# Patient Record
Sex: Female | Born: 1980
Health system: Southern US, Community
[De-identification: ages and names within clinical notes are randomized; demographics above are authoritative.]

## PROBLEM LIST (undated history)

## (undated) DIAGNOSIS — R202 Paresthesia of skin: Secondary | ICD-10-CM

## (undated) DIAGNOSIS — D649 Anemia, unspecified: Secondary | ICD-10-CM

## (undated) DIAGNOSIS — E669 Obesity, unspecified: Secondary | ICD-10-CM

## (undated) DIAGNOSIS — K589 Irritable bowel syndrome without diarrhea: Secondary | ICD-10-CM

## (undated) DIAGNOSIS — E119 Type 2 diabetes mellitus without complications: Secondary | ICD-10-CM

## (undated) DIAGNOSIS — C541 Malignant neoplasm of endometrium: Secondary | ICD-10-CM

## (undated) DIAGNOSIS — R351 Nocturia: Secondary | ICD-10-CM

## (undated) HISTORY — DX: Irritable bowel syndrome, unspecified: K58.9

## (undated) HISTORY — DX: Malignant neoplasm of endometrium: C54.1

## (undated) HISTORY — PX: TYMPANOSTOMY TUBE PLACEMENT: SHX32

---

## 2003-11-09 ENCOUNTER — Emergency Department (HOSPITAL_COMMUNITY): Admission: EM | Admit: 2003-11-09 | Discharge: 2003-11-09 | Payer: Self-pay | Admitting: Emergency Medicine

## 2004-01-14 ENCOUNTER — Emergency Department (HOSPITAL_COMMUNITY): Admission: EM | Admit: 2004-01-14 | Discharge: 2004-01-14 | Payer: Self-pay | Admitting: *Deleted

## 2005-05-29 ENCOUNTER — Emergency Department (HOSPITAL_COMMUNITY): Admission: EM | Admit: 2005-05-29 | Discharge: 2005-05-29 | Payer: Self-pay | Admitting: Emergency Medicine

## 2005-06-03 ENCOUNTER — Emergency Department (HOSPITAL_COMMUNITY): Admission: EM | Admit: 2005-06-03 | Discharge: 2005-06-03 | Payer: Self-pay | Admitting: Emergency Medicine

## 2005-12-17 ENCOUNTER — Emergency Department (HOSPITAL_COMMUNITY): Admission: EM | Admit: 2005-12-17 | Discharge: 2005-12-17 | Payer: Self-pay | Admitting: Emergency Medicine

## 2005-12-21 ENCOUNTER — Emergency Department (HOSPITAL_COMMUNITY): Admission: EM | Admit: 2005-12-21 | Discharge: 2005-12-21 | Payer: Self-pay | Admitting: Emergency Medicine

## 2006-01-13 ENCOUNTER — Emergency Department (HOSPITAL_COMMUNITY): Admission: EM | Admit: 2006-01-13 | Discharge: 2006-01-13 | Payer: Self-pay | Admitting: Emergency Medicine

## 2009-02-15 ENCOUNTER — Emergency Department (HOSPITAL_COMMUNITY): Admission: EM | Admit: 2009-02-15 | Discharge: 2009-02-15 | Payer: Self-pay | Admitting: Emergency Medicine

## 2009-03-23 ENCOUNTER — Emergency Department (HOSPITAL_COMMUNITY): Admission: EM | Admit: 2009-03-23 | Discharge: 2009-03-24 | Payer: Self-pay | Admitting: Emergency Medicine

## 2010-08-27 ENCOUNTER — Ambulatory Visit
Admission: RE | Admit: 2010-08-27 | Discharge: 2010-08-27 | Disposition: A | Discharge status: Home / Self Care | Payer: 59 | Source: Ambulatory Visit | Attending: Family Medicine | Admitting: Family Medicine

## 2010-08-27 ENCOUNTER — Other Ambulatory Visit: Payer: Self-pay | Admitting: Family Medicine

## 2010-08-27 DIAGNOSIS — I2699 Other pulmonary embolism without acute cor pulmonale: Secondary | ICD-10-CM

## 2010-08-27 DIAGNOSIS — R0789 Other chest pain: Secondary | ICD-10-CM

## 2010-08-27 MED ORDER — IOHEXOL 350 MG/ML SOLN
125.0000 mL | Freq: Once | INTRAVENOUS | Status: AC | PRN
  Administered 2010-08-27: 125 mL via INTRAVENOUS

## 2012-02-09 ENCOUNTER — Emergency Department (HOSPITAL_COMMUNITY): Payer: 59

## 2012-02-09 ENCOUNTER — Emergency Department (HOSPITAL_COMMUNITY)
Admission: EM | Admit: 2012-02-09 | Discharge: 2012-02-09 | Disposition: A | Discharge status: Home / Self Care | Payer: 59 | Attending: Emergency Medicine | Admitting: Emergency Medicine

## 2012-02-09 ENCOUNTER — Encounter (HOSPITAL_COMMUNITY): Payer: Self-pay | Admitting: Emergency Medicine

## 2012-02-09 DIAGNOSIS — K529 Noninfective gastroenteritis and colitis, unspecified: Secondary | ICD-10-CM

## 2012-02-09 DIAGNOSIS — K5289 Other specified noninfective gastroenteritis and colitis: Secondary | ICD-10-CM | POA: Insufficient documentation

## 2012-02-09 DIAGNOSIS — K449 Diaphragmatic hernia without obstruction or gangrene: Secondary | ICD-10-CM | POA: Insufficient documentation

## 2012-02-09 DIAGNOSIS — Z3202 Encounter for pregnancy test, result negative: Secondary | ICD-10-CM | POA: Insufficient documentation

## 2012-02-09 LAB — BASIC METABOLIC PANEL
BUN: 13 mg/dL (ref 6–23)
Creatinine, Ser: 0.69 mg/dL (ref 0.50–1.10)
GFR calc Af Amer: >90 mL/min (ref >90)
GFR calc non Af Amer: >90 mL/min (ref >90)
Glucose, Bld: 94 mg/dL (ref 70–99)
Potassium: 4.5 mEq/L (ref 3.5–5.1)

## 2012-02-09 LAB — CBC WITH DIFFERENTIAL/PLATELET
Basophils Relative: 0 % (ref 0–1)
Eosinophils Absolute: 0 10*3/uL (ref 0.0–0.7)
Eosinophils Relative: 1 % (ref 0–5)
HCT: 37.3 % (ref 36.0–46.0)
Hemoglobin: 12.6 g/dL (ref 12.0–15.0)
Lymphs Abs: 2.1 10*3/uL (ref 0.7–4.0)
MCH: 27 pg (ref 26.0–34.0)
MCHC: 33.8 g/dL (ref 30.0–36.0)
MCV: 80 fL (ref 78.0–100.0)
Monocytes Absolute: 0.3 10*3/uL (ref 0.1–1.0)
Monocytes Relative: 5 % (ref 3–12)
Neutrophils Relative %: 57 % (ref 43–77)
RBC: 4.66 MIL/uL (ref 3.87–5.11)

## 2012-02-09 LAB — URINALYSIS, ROUTINE W REFLEX MICROSCOPIC
Bilirubin Urine: NEGATIVE
Glucose, UA: NEGATIVE mg/dL
Ketones, ur: NEGATIVE mg/dL
Nitrite: NEGATIVE
Protein, ur: NEGATIVE mg/dL
pH: 5 (ref 5.0–8.0)

## 2012-02-09 LAB — URINE MICROSCOPIC-ADD ON

## 2012-02-09 NOTE — ED Notes (Signed)
Pt denies nausea/vomiting/diarrhea. Pt states last year she went to gastroenterologist and was told her intestines were having spasms.

## 2012-02-09 NOTE — ED Notes (Signed)
CT called and notified results of pregnancy test have resulted and pt ready for transport to CT.

## 2012-02-09 NOTE — ED Notes (Signed)
Pt states when she went to the bathroom earlier she passed a watery stool like diarrhea.

## 2012-02-09 NOTE — ED Provider Notes (Signed)
History     CSN: [id number]  Arrival date & time 02/09/12  1036   First MD Initiated Contact with Patient 02/09/12 1143      Chief Complaint  Patient presents with  . Abdominal Pain     HPI Patient presents with periumbilical abdominal pain which comes and goes.  Patient states the pain is intense initially and then goes away.  Pain was 0/10 when I examined her.  Has had some loose stools and diarrhea.  No fever chills.  No previous abdominal surgeries. History reviewed. No pertinent past medical history.  History reviewed. No pertinent past surgical history.  History reviewed. No pertinent family history.  History  Substance Use Topics  . Smoking status: Not on file  . Smokeless tobacco: Not on file  . Alcohol Use: Not on file    OB History    Grav Para Term Preterm Abortions TAB SAB Ect Mult Living                  Review of Systems All other systems reviewed and are negative Allergies  Latex  Home Medications  No current outpatient prescriptions on file.  BP 137/72  Pulse 80  Temp 97.1 F (36.2 C) (Oral)  Resp 16  SpO2 100%  LMP 06/09/2011  Physical Exam  Nursing note and vitals reviewed. Constitutional: She is oriented to person, place, and time. She appears well-developed and well-nourished. No distress.       Morbid obesity  HENT:  Head: Normocephalic and atraumatic.  Eyes: Pupils are equal, round, and reactive to light.  Neck: Normal range of motion.  Cardiovascular: Normal rate and intact distal pulses.   Pulmonary/Chest: No respiratory distress.  Abdominal: Normal appearance. She exhibits no distension. There is no tenderness. There is no rebound.  Musculoskeletal: Normal range of motion.  Neurological: She is alert and oriented to person, place, and time. No cranial nerve deficit.  Skin: Skin is warm and dry. No rash noted.  Psychiatric: She has a normal mood and affect. Her behavior is normal.    ED Course  Procedures (including critical  care time)  Labs Reviewed  URINALYSIS, ROUTINE W REFLEX MICROSCOPIC - Abnormal; Notable for the following:    APPearance HAZY (*)     Hgb urine dipstick MODERATE (*)     Leukocytes, UA SMALL (*)     All other components within normal limits  URINE MICROSCOPIC-ADD ON - Abnormal; Notable for the following:    Squamous Epithelial / LPF FEW (*)     Bacteria, UA MANY (*)     All other components within normal limits  CBC WITH DIFFERENTIAL  BASIC METABOLIC PANEL  LIPASE, BLOOD  PREGNANCY, URINE  URINE CULTURE   Ct Abdomen Pelvis Wo Contrast  02/09/2012  *RADIOLOGY REPORT*  Clinical Data: Central abdominal pain.  Hematuria.  History of interval bowel syndrome.  CT ABDOMEN AND PELVIS WITHOUT CONTRAST  Technique:  Multidetector CT imaging of the abdomen and pelvis was performed following the standard protocol without intravenous contrast.  Comparison: Chest CT 08/27/2010.  No comparison abdominal/pelvic CT.  Findings: Lung bases clear.  No evidence of renal or ureteral obstructing stone or findings to suggest renal collecting system obstruction.  No gross abnormality detected involving the bladder.  No extraluminal bowel inflammatory process, free fluid or free air. Specifically, no inflammation surrounds the appendix.  Small hiatal hernia.  Fatty infiltration of liver.  Evaluation of solid abdominal viscera is limited by lack of IV contrast.  Taking  this limitation into account no worrisome focal hepatic, splenic, pancreatic, renal or adrenal lesion.  Dense bile without evidence of calcified gallstone.  No bony destructive lesion.  Mild degenerative changes lumbar spine.  No obvious uterine or adnexa abnormality.  Scattered normal size mesenteric and retroperitoneal lymph nodes.  IMPRESSION: No evidence of renal or ureteral obstructing stone or findings to suggest renal collecting system obstruction.  No extraluminal bowel inflammatory process, free fluid or free air. Specifically, no inflammation  surrounds the appendix.  Small hiatal hernia.  Fatty infiltration of liver.   Original Report Authenticated By: Lacy Duverney, M.D.      1. Gastroenteritis   2. Hiatal hernia       MDM  Patient is requesting food and drink.       Nelia Shi, MD 02/09/12 [phone]

## 2012-02-09 NOTE — ED Notes (Signed)
Per EMS: pt c/o umbilical abd pain starting this am; pt sts pain is severe; pt sts ate at [employer] yesterday; unknown LMP

## 2012-02-09 NOTE — ED Notes (Signed)
Pts ticket to ride placed on pts bed. Pt denies nausea. Pt denies pain currently. Pt mentating appropriately. Pt states she just "feels a little weak."

## 2012-02-10 LAB — URINE CULTURE

## 2014-04-24 ENCOUNTER — Emergency Department (HOSPITAL_COMMUNITY): Payer: Worker's Compensation

## 2014-04-24 ENCOUNTER — Emergency Department (HOSPITAL_COMMUNITY)
Admission: EM | Admit: 2014-04-24 | Discharge: 2014-04-24 | Disposition: A | Discharge status: Home / Self Care | Payer: Worker's Compensation | Attending: Emergency Medicine | Admitting: Emergency Medicine

## 2014-04-24 ENCOUNTER — Encounter (HOSPITAL_COMMUNITY): Payer: Self-pay | Admitting: Emergency Medicine

## 2014-04-24 DIAGNOSIS — W19XXXA Unspecified fall, initial encounter: Secondary | ICD-10-CM

## 2014-04-24 DIAGNOSIS — Z91040 Latex allergy status: Secondary | ICD-10-CM | POA: Insufficient documentation

## 2014-04-24 DIAGNOSIS — W010XXA Fall on same level from slipping, tripping and stumbling without subsequent striking against object, initial encounter: Secondary | ICD-10-CM | POA: Insufficient documentation

## 2014-04-24 DIAGNOSIS — E119 Type 2 diabetes mellitus without complications: Secondary | ICD-10-CM | POA: Diagnosis not present

## 2014-04-24 DIAGNOSIS — Y9389 Activity, other specified: Secondary | ICD-10-CM | POA: Diagnosis not present

## 2014-04-24 DIAGNOSIS — M1711 Unilateral primary osteoarthritis, right knee: Secondary | ICD-10-CM | POA: Insufficient documentation

## 2014-04-24 DIAGNOSIS — Y9289 Other specified places as the place of occurrence of the external cause: Secondary | ICD-10-CM | POA: Diagnosis not present

## 2014-04-24 DIAGNOSIS — Y998 Other external cause status: Secondary | ICD-10-CM | POA: Diagnosis not present

## 2014-04-24 DIAGNOSIS — Z862 Personal history of diseases of the blood and blood-forming organs and certain disorders involving the immune mechanism: Secondary | ICD-10-CM | POA: Insufficient documentation

## 2014-04-24 DIAGNOSIS — S8991XA Unspecified injury of right lower leg, initial encounter: Secondary | ICD-10-CM | POA: Insufficient documentation

## 2014-04-24 HISTORY — DX: Type 2 diabetes mellitus without complications: E11.9

## 2014-04-24 HISTORY — DX: Anemia, unspecified: D64.9

## 2014-04-24 MED ORDER — IBUPROFEN 400 MG PO TABS
600.0000 mg | ORAL_TABLET | Freq: Once | ORAL | Status: AC
  Administered 2014-04-24: 600 mg via ORAL
  Filled 2014-04-24: qty 3

## 2014-04-24 NOTE — ED Provider Notes (Signed)
CSN: [id number]     Arrival date & time 04/24/14  8101 History   This chart was scribed for non-physician practitioner, Al Corpus, PA-C, working with Evelina Bucy, MD by Ladene Artist, ED Scribe. This patient was seen in room TR11C/TR11C and the patient's care was started at .   Chief Complaint  Patient presents with  . Knee Pain   The history is provided by the patient. No language interpreter was used.   HPI Comments: Krista White is a 34 y.o. female, with a h/o DM, anemia, who presents to the Emergency Department complaining of a fall that occurred this morning. Pt was at work when she slipped on a rug and fell. She states that she fell into a split and her R leg twisted under her upon landing. She reports secondary R knee pain and R leg pain that is exacerbated with bearing weight. Pt denies associated joint swelling. No medications tried PTA.   Past Medical History  Diagnosis Date  . Anemia   . Diabetes mellitus without complication    History reviewed. No pertinent past surgical history. No family history on file. History  Substance Use Topics  . Smoking status: Never Smoker   . Smokeless tobacco: Not on file  . Alcohol Use: No   OB History    No data available     Review of Systems  Musculoskeletal: Positive for myalgias and arthralgias. Negative for joint swelling.  Skin: Negative for color change and wound.  Neurological: Negative for weakness and numbness.   Allergies  Latex  Home Medications   Prior to Admission medications   Not on File   BP 136/77 mmHg  Pulse 81  Temp(Src) 97.7 F (36.5 C) (Oral)  Resp 18  Ht 5\' 4"  (1.626 m)  Wt 399 lb (180.985 kg)  BMI 68.45 kg/m2  SpO2 98%  LMP 04/17/2014 Physical Exam  Constitutional: She appears well-developed and well-nourished. No distress.  HENT:  Head: Normocephalic and atraumatic.  Eyes: Conjunctivae are normal. Right eye exhibits no discharge. Left eye exhibits no discharge.  Pulmonary/Chest:  Effort normal. No respiratory distress.  Musculoskeletal:  Left knee: No effusion, erythema or warmth. No tenderness over medial or lateral joint line. Tenderness to medial posterior knee without bursa or cyst. No ligamentous laxity noted. Negative anterior and posterior drawer test. Patient with normal gait. No tenderness to lower leg or thigh. No lesions, ecchymoses or wounds to thigh or leg. Patient ambulatory.   Neurological: She is alert. Coordination normal.  Skin: She is not diaphoretic.  Psychiatric: She has a normal mood and affect. Her behavior is normal.  Nursing note and vitals reviewed.  ED Course  Procedures (including critical care time) DIAGNOSTIC STUDIES: Oxygen Saturation is 98% on RA, normal by my interpretation.    COORDINATION OF CARE: 10:32 AM-Discussed treatment plan which includes XR, ace bandage, ibuprofen and RICE with pt at bedside and pt agreed to plan.   Labs Review Labs Reviewed - No data to display  Imaging Review Dg Knee Complete 4 Views Right  04/24/2014   CLINICAL DATA:  Slipped at work today, now with anterior knee pain.  EXAM: RIGHT KNEE - COMPLETE 4+ VIEW  COMPARISON:  None.  FINDINGS: No fracture or dislocation. Mild to moderate tricompartmental degenerative change of the knee, worse within the medial compartments and patellofemoral joints with joint space loss, articular surface irregularity, subchondral sclerosis and osteophytosis. No joint effusion. There is minimal spurring of the tibial spines. No evidence of chondrocalcinosis. Regional  soft tissues appear normal.  IMPRESSION: 1. No acute findings. 2. Mild-to-moderate tricompartmental degenerative change of the knee, worse within the medial compartments and patellofemoral joints.   Electronically Signed   By: Sandi Mariscal M.D.   On: 04/24/2014 09:58    EKG Interpretation None      MDM   Final diagnoses:  Fall, initial encounter  Right knee injury, initial encounter  Primary osteoarthritis of  right knee   Patient presents with fall to right knee with tenderness posterior aspect. Neurovascularly intact. Patient ambulatory. X-ray with evidence of osteoarthritis burst and patellofemoral joint but no acute abnormality. Rice protocol and ibuprofen discussed with patient. She is to follow-up with her primary for persistent symptoms.  Discussed return precautions with patient. Discussed all results and patient verbalizes understanding and agrees with plan.  I personally performed the services described in this documentation, which was scribed in my presence. The recorded information has been reviewed and is accurate.    Al Corpus, PA-C 04/24/14 Elaine, MD 04/24/14 [phone]

## 2014-04-24 NOTE — Discharge Instructions (Signed)
Return to the emergency room with worsening of symptoms, new symptoms or with symptoms that are concerning, especially fevers, redness, swelling, numbness, dealing, weakness. RICE: Rest, Ice (three cycles of 20 mins on, 50mins off at least twice a day), compression/brace, elevation. Heating pad works well for back pain. Ibuprofen 400mg  (2 tablets 200mg ) every 5-6 hours for 3-5 days. Follow up with PCP if symptoms worsen or are persistent. Read below information and follow recommendations. Knee Pain The knee is the complex joint between your thigh and your lower leg. It is made up of bones, tendons, ligaments, and cartilage. The bones that make up the knee are:  The femur in the thigh.  The tibia and fibula in the lower leg.  The patella or kneecap riding in the groove on the lower femur. CAUSES  Knee pain is a common complaint with many causes. A few of these causes are:  Injury, such as:  A ruptured ligament or tendon injury.  Torn cartilage.  Medical conditions, such as:  Gout  Arthritis  Infections  Overuse, over training, or overdoing a physical activity. Knee pain can be minor or severe. Knee pain can accompany debilitating injury. Minor knee problems often respond well to self-care measures or get well on their own. More serious injuries may need medical intervention or even surgery. SYMPTOMS The knee is complex. Symptoms of knee problems can vary widely. Some of the problems are:  Pain with movement and weight bearing.  Swelling and tenderness.  Buckling of the knee.  Inability to straighten or extend your knee.  Your knee locks and you cannot straighten it.  Warmth and redness with pain and fever.  Deformity or dislocation of the kneecap. DIAGNOSIS  Determining what is wrong may be very straight forward such as when there is an injury. It can also be challenging because of the complexity of the knee. Tests to make a diagnosis may include:  Your caregiver  taking a history and doing a physical exam.  Routine X-rays can be used to rule out other problems. X-rays will not reveal a cartilage tear. Some injuries of the knee can be diagnosed by:  Arthroscopy a surgical technique by which a small video camera is inserted through tiny incisions on the sides of the knee. This procedure is used to examine and repair internal knee joint problems. Tiny instruments can be used during arthroscopy to repair the torn knee cartilage (meniscus).  Arthrography is a radiology technique. A contrast liquid is directly injected into the knee joint. Internal structures of the knee joint then become visible on X-ray film.  An MRI scan is a non X-ray radiology procedure in which magnetic fields and a computer produce two- or three-dimensional images of the inside of the knee. Cartilage tears are often visible using an MRI scanner. MRI scans have largely replaced arthrography in diagnosing cartilage tears of the knee.  Blood work.  Examination of the fluid that helps to lubricate the knee joint (synovial fluid). This is done by taking a sample out using a needle and a syringe. TREATMENT The treatment of knee problems depends on the cause. Some of these treatments are:  Depending on the injury, proper casting, splinting, surgery, or physical therapy care will be needed.  Give yourself adequate recovery time. Do not overuse your joints. If you begin to get sore during workout routines, back off. Slow down or do fewer repetitions.  For repetitive activities such as cycling or running, maintain your strength and nutrition.  Alternate muscle  groups. For example, if you are a weight lifter, work the upper body on one day and the lower body the next.  Either tight or weak muscles do not give the proper support for your knee. Tight or weak muscles do not absorb the stress placed on the knee joint. Keep the muscles surrounding the knee strong.  Take care of mechanical  problems.  If you have flat feet, orthotics or special shoes may help. See your caregiver if you need help.  Arch supports, sometimes with wedges on the inner or outer aspect of the heel, can help. These can shift pressure away from the side of the knee most bothered by osteoarthritis.  A brace called an "unloader" brace also may be used to help ease the pressure on the most arthritic side of the knee.  If your caregiver has prescribed crutches, braces, wraps or ice, use as directed. The acronym for this is PRICE. This means protection, rest, ice, compression, and elevation.  Nonsteroidal anti-inflammatory drugs (NSAIDs), can help relieve pain. But if taken immediately after an injury, they may actually increase swelling. Take NSAIDs with food in your stomach. Stop them if you develop stomach problems. Do not take these if you have a history of ulcers, stomach pain, or bleeding from the bowel. Do not take without your caregiver's approval if you have problems with fluid retention, heart failure, or kidney problems.  For ongoing knee problems, physical therapy may be helpful.  Glucosamine and chondroitin are over-the-counter dietary supplements. Both may help relieve the pain of osteoarthritis in the knee. These medicines are different from the usual anti-inflammatory drugs. Glucosamine may decrease the rate of cartilage destruction.  Injections of a corticosteroid drug into your knee joint may help reduce the symptoms of an arthritis flare-up. They may provide pain relief that lasts a few months. You may have to wait a few months between injections. The injections do have a small increased risk of infection, water retention, and elevated blood sugar levels.  Hyaluronic acid injected into damaged joints may ease pain and provide lubrication. These injections may work by reducing inflammation. A series of shots may give relief for as long as 6 months.  Topical painkillers. Applying certain  ointments to your skin may help relieve the pain and stiffness of osteoarthritis. Ask your pharmacist for suggestions. Many over the-counter products are approved for temporary relief of arthritis pain.  In some countries, doctors often prescribe topical NSAIDs for relief of chronic conditions such as arthritis and tendinitis. A review of treatment with NSAID creams found that they worked as well as oral medications but without the serious side effects. PREVENTION  Maintain a healthy weight. Extra pounds put more strain on your joints.  Get strong, stay limber. Weak muscles are a common cause of knee injuries. Stretching is important. Include flexibility exercises in your workouts.  Be smart about exercise. If you have osteoarthritis, chronic knee pain or recurring injuries, you may need to change the way you exercise. This does not mean you have to stop being active. If your knees ache after jogging or playing basketball, consider switching to swimming, water aerobics, or other low-impact activities, at least for a few days a week. Sometimes limiting high-impact activities will provide relief.  Make sure your shoes fit well. Choose footwear that is right for your sport.  Protect your knees. Use the proper gear for knee-sensitive activities. Use kneepads when playing volleyball or laying carpet. Buckle your seat belt every time you drive. Most  shattered kneecaps occur in car accidents.  Rest when you are tired. SEEK MEDICAL CARE IF:  You have knee pain that is continual and does not seem to be getting better.  SEEK IMMEDIATE MEDICAL CARE IF:  Your knee joint feels hot to the touch and you have a high fever. MAKE SURE YOU:   Understand these instructions.  Will watch your condition.  Will get help right away if you are not doing well or get worse. Document Released: 11/17/2006 Document Revised: 04/14/2011 Document Reviewed: 11/17/2006 [facility] Patient Information 2015 [place], [place]. This  information is not intended to replace advice given to you by your health care provider. Make sure you discuss any questions you have with your health care provider.

## 2014-04-24 NOTE — ED Notes (Signed)
Patient states tripped on rug at work and landed "with my leg twisted up under me".  Patient complains of R knee pain and leg pain when walking.

## 2014-05-27 ENCOUNTER — Ambulatory Visit (INDEPENDENT_AMBULATORY_CARE_PROVIDER_SITE_OTHER): Payer: 59 | Admitting: Internal Medicine

## 2014-05-27 VITALS — BP 123/78 | HR 99 | Temp 98.2°F | Resp 20 | Ht 64.0 in | Wt 396.5 lb

## 2014-05-27 DIAGNOSIS — J0100 Acute maxillary sinusitis, unspecified: Secondary | ICD-10-CM | POA: Diagnosis not present

## 2014-05-27 MED ORDER — AMOXICILLIN 500 MG PO CAPS: 1000.0000 mg | ORAL_CAPSULE | Freq: Two times a day (BID) | ORAL | Status: AC

## 2014-05-27 MED ORDER — MELOXICAM 15 MG PO TABS: 15.0000 mg | ORAL_TABLET | Freq: Every day | ORAL | Status: DC

## 2014-05-27 NOTE — Progress Notes (Signed)
   Subjective:  This chart was scribed for Krista Lin, MD by Mercy Moore, Medial Scribe. This patient was seen in room 10 and the patient's care was started at 12:28 PM.    Patient ID: Krista White, female    DOB: 1980/09/24, 34 y.o.   MRN: [id number] Chief Complaint  Patient presents with  . Sinus Pressure  . Ear Pain    LEft    HPI HPI Comments: Krista White is a 34 y.o. female who presents to the [facility] complaining of sinus infection that initiated as headache a few days ago. Patient reports later development radiation of pain into her left ear and left face and onset of cough last night. Patient congestion upon waking up that is relieved with blowing her nose. Patient reports increased sinus pressure with bending over.   There are no active problems to display for this patient.  she is a new patient to this practice coming for urgent care She has a PCP for her diabetes and morbid obesity  Past Medical History  Diagnosis Date  . Anemia   . Diabetes mellitus without complication    History reviewed. No pertinent past surgical history. Allergies  Allergen Reactions  . Latex Itching  . Pineapple     Mouth itches  . Lactase Diarrhea   Prior to Admission medications   Not on File   History   Social History  . Marital Status: Single    Spouse Name: N/A  . Number of Children: N/A  . Years of Education: N/A   Occupational History  . Not on file.   Social History Main Topics  . Smoking status: Never Smoker   . Smokeless tobacco: Never Used  . Alcohol Use: No  . Drug Use: No  . Sexual Activity: Not on file   Other Topics Concern  . Not on file   Social History Narrative      Review of Systems  Constitutional: Negative for fever.  HENT: Positive for congestion and sinus pressure.   Respiratory: Positive for cough.   Neurological: Positive for headaches.   no recent weight changes     Objective:   Physical Exam  HENT:   TMs clear. Nares with purulent mucus on left. Throat clear.   Neck:  No cervical nodes.  Pulmonary/Chest:  Lungs clear to ascultation.    heart regular without murmur Extremities clear No skin rash   Filed Vitals:   05/27/14 1211  BP: 123/78  Pulse: 99  Temp: 98.2 F (36.8 C)  TempSrc: Oral  Resp: 20  Height: 5\' 4"  (1.626 m)  Weight: 396 lb 8 oz (179.851 kg)  SpO2: 97%         Assessment & Plan:  Acute maxillary sinusitis, recurrence not specified   Meds ordered this encounter  Medications  . amoxicillin (AMOXIL) 500 MG capsule    Sig: Take 2 capsules (1,000 mg total) by mouth 2 (two) times daily.    Dispense:  40 capsule    Refill:  0  . meloxicam (MOBIC) 15 MG tablet    Sig: Take 1 tablet (15 mg total) by mouth daily. For pain    Dispense:  10 tablet    Refill:  0   Follow-up with PCP regarding morbid obesity I have completed the patient encounter in its entirety as documented by the scribe, with editing by me where necessary. Deovion Batrez P. Laney Pastor, M.D.

## 2014-06-10 ENCOUNTER — Emergency Department (HOSPITAL_COMMUNITY)
Admission: EM | Admit: 2014-06-10 | Discharge: 2014-06-10 | Disposition: A | Discharge status: Home / Self Care | Payer: 59 | Attending: Emergency Medicine | Admitting: Emergency Medicine

## 2014-06-10 ENCOUNTER — Encounter (HOSPITAL_COMMUNITY): Payer: Self-pay | Admitting: Emergency Medicine

## 2014-06-10 DIAGNOSIS — Z91040 Latex allergy status: Secondary | ICD-10-CM | POA: Diagnosis not present

## 2014-06-10 DIAGNOSIS — E669 Obesity, unspecified: Secondary | ICD-10-CM | POA: Diagnosis not present

## 2014-06-10 DIAGNOSIS — Z791 Long term (current) use of non-steroidal anti-inflammatories (NSAID): Secondary | ICD-10-CM | POA: Diagnosis not present

## 2014-06-10 DIAGNOSIS — H9202 Otalgia, left ear: Secondary | ICD-10-CM | POA: Diagnosis not present

## 2014-06-10 DIAGNOSIS — Z862 Personal history of diseases of the blood and blood-forming organs and certain disorders involving the immune mechanism: Secondary | ICD-10-CM | POA: Diagnosis not present

## 2014-06-10 DIAGNOSIS — E119 Type 2 diabetes mellitus without complications: Secondary | ICD-10-CM | POA: Insufficient documentation

## 2014-06-10 DIAGNOSIS — R0981 Nasal congestion: Secondary | ICD-10-CM | POA: Diagnosis not present

## 2014-06-10 HISTORY — DX: Obesity, unspecified: E66.9

## 2014-06-10 NOTE — Discharge Instructions (Signed)
Please use Zyrtec for your symptoms. Your symptoms do not resolve in the next 5-7 days, please follow-up with ear nose and throat specialist that has been provided. There is no evidence of bacterial infection on your exam today, thus no antibiotics were indicated in your treatment.  Otalgia The most common reason for this in children is an infection of the middle ear. Pain from the middle ear is usually caused by a build-up of fluid and pressure behind the eardrum. Pain from an earache can be sharp, dull, or burning. The pain may be temporary or constant. The middle ear is connected to the nasal passages by a short narrow tube called the Eustachian tube. The Eustachian tube allows fluid to drain out of the middle ear, and helps keep the pressure in your ear equalized. CAUSES  A cold or allergy can block the Eustachian tube with inflammation and the build-up of secretions. This is especially likely in small children, because their Eustachian tube is shorter and more horizontal. When the Eustachian tube closes, the normal flow of fluid from the middle ear is stopped. Fluid can accumulate and cause stuffiness, pain, hearing loss, and an ear infection if germs start growing in this area. SYMPTOMS  The symptoms of an ear infection may include fever, ear pain, fussiness, increased crying, and irritability. Many children will have temporary and minor hearing loss during and right after an ear infection. Permanent hearing loss is rare, but the risk increases the more infections a child has. Other causes of ear pain include retained water in the outer ear canal from swimming and bathing. Ear pain in adults is less likely to be from an ear infection. Ear pain may be referred from other locations. Referred pain may be from the joint between your jaw and the skull. It may also come from a tooth problem or problems in the neck. Other causes of ear pain include:  A foreign body in the ear.  Outer ear  infection.  Sinus infections.  Impacted ear wax.  Ear injury.  Arthritis of the jaw or TMJ problems.  Middle ear infection.  Tooth infections.  Sore throat with pain to the ears. DIAGNOSIS  Your caregiver can usually make the diagnosis by examining you. Sometimes other special studies, including x-rays and lab work may be necessary. TREATMENT   If antibiotics were prescribed, use them as directed and finish them even if you or your child's symptoms seem to be improved.  Sometimes PE tubes are needed in children. These are little plastic tubes which are put into the eardrum during a simple surgical procedure. They allow fluid to drain easier and allow the pressure in the middle ear to equalize. This helps relieve the ear pain caused by pressure changes. HOME CARE INSTRUCTIONS   Only take over-the-counter or prescription medicines for pain, discomfort, or fever as directed by your caregiver. DO NOT GIVE CHILDREN ASPIRIN because of the association of Reye's Syndrome in children taking aspirin.  Use a cold pack applied to the outer ear for 15-20 minutes, 03-04 times per day or as needed may reduce pain. Do not apply ice directly to the skin. You may cause frost bite.  Over-the-counter ear drops used as directed may be effective. Your caregiver may sometimes prescribe ear drops.  Resting in an upright position may help reduce pressure in the middle ear and relieve pain.  Ear pain caused by rapidly descending from high altitudes can be relieved by swallowing or chewing gum. Allowing infants to suck  on a bottle during airplane travel can help.  Do not smoke in the house or near children. If you are unable to quit smoking, smoke outside.  Control allergies. SEEK IMMEDIATE MEDICAL CARE IF:   You or your child are becoming sicker.  Pain or fever relief is not obtained with medicine.  You or your child's symptoms (pain, fever, or irritability) do not improve within 24 to 48 hours or  as instructed.  Severe pain suddenly stops hurting. This may indicate a ruptured eardrum.  You or your children develop new problems such as severe headaches, stiff neck, difficulty swallowing, or swelling of the face or around the ear. Document Released: 09/07/2003 Document Revised: 04/14/2011 Document Reviewed: 01/12/2008 [facility] Patient Information 2015 [place], [place]. This information is not intended to replace advice given to you by your health care provider. Make sure you discuss any questions you have with your health care provider.

## 2014-06-10 NOTE — ED Provider Notes (Signed)
CSN: [id number]     Arrival date & time 06/10/14  1921 History  This chart was scribed for a non-physician practitioner, Montine Circle, PA-C working with Nat Christen, MD by [place] Peace, ED Scribe. The patient was seen in [facility]. The patient's care was started at 7:47 PM.     Chief Complaint  Patient presents with  . Otalgia      Patient is a 34 y.o. female presenting with ear pain. The history is provided by the patient. No language interpreter was used.  Otalgia Location:  Left Duration:  3 weeks Progression:  Unchanged Associated symptoms: congestion   Associated symptoms: no diarrhea and no vomiting   Risk factors: chronic ear infection   HPI Comments: Krista White is a 34 y.o. female who presents to the Emergency Department complaining of radiating left ear pain x 3 weeks. Pt reports that she was seen previously seen at the Urgent Care where she was given Amoxicillin and some pain medication for 10 days to address what she thought was a sinus infection. She explains that she did start the Amoxicillin she was prescribed but did not finish it all the way through because she didn't think it was working. History of Ear Infections and DM. Pt is non-smoker.    Past Medical History  Diagnosis Date  . Anemia   . Diabetes mellitus without complication   . Obesity    History reviewed. No pertinent past surgical history. Family History  Problem Relation Age of Onset  . Cancer Mother   . Diabetes Father    History  Substance Use Topics  . Smoking status: Never Smoker   . Smokeless tobacco: Never Used  . Alcohol Use: No   OB History    No data available     Review of Systems  HENT: Positive for congestion and ear pain.   Gastrointestinal: Negative for nausea, vomiting and diarrhea.  All other systems reviewed and are negative.     Allergies  Latex; Pineapple; and Lactase  Home Medications   Prior to Admission medications   Medication Sig Start Date End Date  Taking? Authorizing Provider  meloxicam (MOBIC) 15 MG tablet Take 1 tablet (15 mg total) by mouth daily. For pain 05/27/14   Leandrew Koyanagi, MD   BP 139/82 mmHg  Pulse 87  Temp(Src) 98.2 F (36.8 C) (Oral)  Resp 14  SpO2 97%  LMP 05/20/2014 Physical Exam  Constitutional: She is oriented to person, place, and time. She appears well-developed and well-nourished. No distress.  HENT:  Head: Normocephalic and atraumatic.  Bilateral tympanic membranes have some old scar tissue, but there is no evidence of fluid behind the tympanic membrane, no erythema, no evidence of infection, oropharynx is clear, no states, no abscess, no stridor  Eyes: Conjunctivae and EOM are normal.  Neck: Neck supple. No tracheal deviation present.  Cardiovascular: Normal rate, regular rhythm and normal heart sounds.   Pulmonary/Chest: Effort normal and breath sounds normal. No respiratory distress.  Musculoskeletal: Normal range of motion.  Neurological: She is alert and oriented to person, place, and time.  Skin: Skin is warm and dry.  Psychiatric: She has a normal mood and affect. Her behavior is normal.  Nursing note and vitals reviewed.   ED Course  Procedures (including critical care time) Labs Review Labs Reviewed - No data to display  Imaging Review No results found.   EKG Interpretation None     Medications - No data to display  7:51 PM- Treatment  plan was discussed with patient who verbalizes understanding and agrees.   MDM   Final diagnoses:  Otalgia of left ear    Patient with left ear otalgia 3 weeks. No fevers or chills. There is no evidence of infection on my exam. No mastoid tenderness. Patient has been treated with amoxicillin with no relief. Doubt bacterial process. Will treat with Zyrtec, and recommend follow-up with ENT. Patient understands and agrees the plan. She is stable and ready for discharge.  I personally performed the services described in this documentation, which  was scribed in my presence. The recorded information has been reviewed and is accurate.     Montine Circle, PA-C 06/10/14 2001  Nat Christen, MD 06/11/14 [phone]

## 2014-06-10 NOTE — ED Notes (Signed)
Pt. reports left ear ache onset 3 weeks ago , denies injury or hearing loss / no drainage or fever.

## 2014-06-16 ENCOUNTER — Encounter (HOSPITAL_COMMUNITY): Payer: Self-pay | Admitting: *Deleted

## 2014-06-16 ENCOUNTER — Emergency Department (HOSPITAL_COMMUNITY)
Admission: EM | Admit: 2014-06-16 | Discharge: 2014-06-16 | Disposition: A | Discharge status: Home / Self Care | Payer: 59 | Attending: Emergency Medicine | Admitting: Emergency Medicine

## 2014-06-16 DIAGNOSIS — Z79899 Other long term (current) drug therapy: Secondary | ICD-10-CM | POA: Diagnosis not present

## 2014-06-16 DIAGNOSIS — Z91040 Latex allergy status: Secondary | ICD-10-CM | POA: Diagnosis not present

## 2014-06-16 DIAGNOSIS — Z862 Personal history of diseases of the blood and blood-forming organs and certain disorders involving the immune mechanism: Secondary | ICD-10-CM | POA: Insufficient documentation

## 2014-06-16 DIAGNOSIS — E119 Type 2 diabetes mellitus without complications: Secondary | ICD-10-CM | POA: Insufficient documentation

## 2014-06-16 DIAGNOSIS — E669 Obesity, unspecified: Secondary | ICD-10-CM | POA: Diagnosis not present

## 2014-06-16 DIAGNOSIS — K029 Dental caries, unspecified: Secondary | ICD-10-CM | POA: Diagnosis not present

## 2014-06-16 DIAGNOSIS — K088 Other specified disorders of teeth and supporting structures: Secondary | ICD-10-CM | POA: Diagnosis not present

## 2014-06-16 MED ORDER — AMOXICILLIN 500 MG PO CAPS
500.0000 mg | ORAL_CAPSULE | Freq: Once | ORAL | Status: AC
  Administered 2014-06-16: 500 mg via ORAL
  Filled 2014-06-16: qty 1

## 2014-06-16 MED ORDER — OXYCODONE-ACETAMINOPHEN 5-325 MG PO TABS: ORAL_TABLET | ORAL | Status: DC

## 2014-06-16 MED ORDER — BUPIVACAINE-EPINEPHRINE (PF) 0.5% -1:200000 IJ SOLN
1.8000 mL | Freq: Once | INTRAMUSCULAR | Status: AC
  Administered 2014-06-16: 1.8 mL
  Filled 2014-06-16: qty 1.8

## 2014-06-16 MED ORDER — AMOXICILLIN 500 MG PO CAPS: 500.0000 mg | ORAL_CAPSULE | Freq: Three times a day (TID) | ORAL | Status: DC

## 2014-06-16 MED ORDER — OXYCODONE-ACETAMINOPHEN 5-325 MG PO TABS
1.0000 | ORAL_TABLET | Freq: Once | ORAL | Status: AC
  Administered 2014-06-16: 1 via ORAL
  Filled 2014-06-16: qty 1

## 2014-06-16 NOTE — ED Provider Notes (Signed)
CSN: [id number]     Arrival date & time 06/16/14  2141 History   This chart was scribed for non-physician practitioner working, [employer], PA-C,  with [place], DO, by Jeanell Sparrow, ED Scribe. This patient was seen in room TR05C/TR05C and the patient's care was started at 9:59 PM.  Chief Complaint  Patient presents with  . Dental Pain   The history is provided by the patient. No language interpreter was used.   HPI Comments: Krista White is a 34 y.o. female who presents to the Emergency Department complaining of constant moderate dental pain that started 3 days ago. She states that the pain started off only in her jaw but moved to her tooth and worsened. She reports that the past 3 days she has been having left upper molar pain. She currently rates the severity of the pain as a 8/10. She states that drinking liquids exacerbates the pain. She reports that she has been taking ibuprofen without any relief. She denies any facial swelling or fever.   Past Medical History  Diagnosis Date  . Anemia   . Diabetes mellitus without complication   . Obesity    History reviewed. No pertinent past surgical history. Family History  Problem Relation Age of Onset  . Cancer Mother   . Diabetes Father    History  Substance Use Topics  . Smoking status: Never Smoker   . Smokeless tobacco: Never Used  . Alcohol Use: No   OB History    No data available     Review of Systems A complete 10 system review of systems was obtained and all systems are negative except as noted in the HPI and PMH.   Allergies  Latex; Pineapple; and Lactase  Home Medications   Prior to Admission medications   Medication Sig Start Date End Date Taking? Authorizing Provider  amoxicillin (AMOXIL) 500 MG capsule Take 1 capsule (500 mg total) by mouth 3 (three) times daily. 06/16/14   Shella Lahman, PA-C  meloxicam (MOBIC) 15 MG tablet Take 1 tablet (15 mg total) by mouth daily. For pain 05/27/14   Leandrew Koyanagi, MD  oxyCODONE-acetaminophen (PERCOCET/ROXICET) 5-325 MG per tablet 1 to 2 tabs PO q6hrs  PRN for pain 06/16/14   Elmyra Ricks Nguyen Butler, PA-C   BP 163/80 mmHg  Pulse 75  Temp(Src) 98.4 F (36.9 C) (Oral)  Resp 16  Ht 5\' 4"  (1.626 m)  Wt 395 lb (179.171 kg)  BMI 67.77 kg/m2  SpO2 99%  LMP 06/15/2014 Physical Exam  Constitutional: She is oriented to person, place, and time. She appears well-developed and well-nourished. No distress.  HENT:  Head: Normocephalic and atraumatic.  Mouth/Throat: Uvula is midline and oropharynx is clear and moist. No trismus in the jaw. No uvula swelling. No oropharyngeal exudate, posterior oropharyngeal edema, posterior oropharyngeal erythema or tonsillar abscesses.  Generally poor dentition, no gingival swelling, erythema or tenderness to palpation. Patient is handling their secretions. There is no tenderness to palpation or firmness underneath tongue bilaterally. No trismus.    Eyes: Conjunctivae and EOM are normal.  Neck: Neck supple. No tracheal deviation present.  Cardiovascular: Normal rate.   Pulmonary/Chest: Effort normal. No stridor. No respiratory distress.  Musculoskeletal: Normal range of motion.  Lymphadenopathy:    She has no cervical adenopathy.  Neurological: She is alert and oriented to person, place, and time.  Skin: Skin is warm and dry.  Psychiatric: She has a normal mood and affect. Her behavior is normal.  Nursing  note and vitals reviewed.   ED Course  NERVE BLOCK Date/Time: 06/16/2014 11:25 PM Performed by: Monico Blitz Authorized by: Monico Blitz Consent: Verbal consent obtained. Consent given by: patient Patient identity confirmed: verbally with patient Indications: pain relief Body area: face/mouth Nerve: anterior superior alveolar Laterality: left Patient sedated: no Preparation: Patient was prepped and draped in the usual sterile fashion. Patient position: sitting Needle gauge: 10 G Location  technique: anatomical landmarks Local anesthetic: bupivacaine 0.5% with epinephrine Anesthetic total: 1.8 ml Outcome: pain unchanged Patient tolerance: Patient tolerated the procedure well with no immediate complications   (including critical care time) DIAGNOSTIC STUDIES: Oxygen Saturation is 99% on RA, normal by my interpretation.    COORDINATION OF CARE: 10:03 PM- Pt advised of plan for treatment which includes medication and pt agrees.  Labs Review Labs Reviewed - No data to display  Imaging Review No results found.   EKG Interpretation None      MDM   Final diagnoses:  Pain due to dental caries   Filed Vitals:   06/16/14 2151 06/16/14 2312  BP: 163/80 141/81  Pulse: 75 79  Temp: 98.4 F (36.9 C)   TempSrc: Oral   Resp: 16 18  Height: 5\' 4"  (1.626 m)   Weight: 395 lb (179.171 kg)   SpO2: 99% 100%    Medications  amoxicillin (AMOXIL) capsule 500 mg (500 mg Oral Given 06/16/14 2208)  bupivacaine-epinephrine (MARCAINE W/ EPI) 0.5% -1:200000 injection 1.8 mL (1.8 mLs Infiltration Given by Other 06/16/14 2235)  oxyCODONE-acetaminophen (PERCOCET/ROXICET) 5-325 MG per tablet 1 tablet (1 tablet Oral Given 06/16/14 2302)    Krista White is a pleasant 34 y.o. female presenting with dental pain associated with dental caries but no signs or symptoms of dental abscess. Patient afebrile, non toxic appearing and swallowing secretions well. I gave patient referral to dentist and stressed the importance of dental follow up for definitive management of dental issues. Patient voices understanding and is agreeable to plan.  Evaluation does not show pathology that would require ongoing emergent intervention or inpatient treatment. Pt is hemodynamically stable and mentating appropriately. Discussed findings and plan with patient/guardian, who agrees with care plan. All questions answered. Return precautions discussed and outpatient follow up given.   Discharge Medication List as of  06/16/2014 10:47 PM    START taking these medications   Details  amoxicillin (AMOXIL) 500 MG capsule Take 1 capsule (500 mg total) by mouth 3 (three) times daily., Starting 06/16/2014, Until Discontinued, Print    oxyCODONE-acetaminophen (PERCOCET/ROXICET) 5-325 MG per tablet 1 to 2 tabs PO q6hrs  PRN for pain, Print             Monico Blitz, PA-C 06/16/14 [place], DO 06/16/14 2334

## 2014-06-16 NOTE — Discharge Instructions (Signed)
Eat a soft diet for the next 24-48 hours.  Rinse your mouth with saltwater or regular water after you eat any food.  Do not swallow blood or pus becuase it will cause you to vomit,  spit out instead.  Take percocet for breakthrough pain, do not drink alcohol, drive, care for children or do other critical tasks while taking percocet.  Return to the emergency room for fever, change in vision, redness to the face that rapidly spreads towards the eye, nausea or vomiting, difficulty swallowing or shortness of breath.   Apply warm compresses to jaw throughout the day.   Take your antibiotics as directed and to the end of the course. DO NOT drink alcohol when taking metronidazole, it will make you very sick!   Followup with a dentist is very important for ongoing evaluation and management of recurrent dental pain. Return to emergency department for emergent changing or worsening symptoms."   Dental Caries Dental caries is tooth decay. This decay can cause a hole in teeth (cavity) that can get bigger and deeper over time. HOME CARE  Brush and floss your teeth. Do this at least two times a day.  Use a fluoride toothpaste.  Use a mouth rinse if told by your dentist or doctor.  Eat less sugary and starchy foods. Drink less sugary drinks.  Avoid snacking often on sugary and starchy foods. Avoid sipping often on sugary drinks.  Keep regular checkups and cleanings with your dentist.  Use fluoride supplements if told by your dentist or doctor.  Allow fluoride to be applied to teeth if told by your dentist or doctor. Document Released: 10/30/2007 Document Revised: 06/06/2013 Document Reviewed: 01/23/2012 [facility] Patient Information 2015 [place], [place]. This information is not intended to replace advice given to you by your health care provider. Make sure you discuss any questions you have with your health care provider.

## 2014-06-16 NOTE — ED Notes (Signed)
Pt c/o L upper molar pain x 3 days, unrelieved by ibuprofen. Pt has a scheduled dentist appt 5/27, reports pain has increased

## 2014-06-16 NOTE — ED Notes (Signed)
Pt c/o left upper molar pain x 3 days. Usually relieved with ibuprofen, reports increased pain recently. Pt has a dentist appointment scheduled 5/27.

## 2015-01-12 ENCOUNTER — Other Ambulatory Visit: Payer: Self-pay | Admitting: Obstetrics & Gynecology

## 2015-01-25 ENCOUNTER — Encounter (HOSPITAL_COMMUNITY): Payer: Self-pay

## 2015-01-25 ENCOUNTER — Encounter (HOSPITAL_COMMUNITY)
Admission: RE | Admit: 2015-01-25 | Discharge: 2015-01-25 | Disposition: A | Discharge status: Home / Self Care | Payer: 59 | Source: Ambulatory Visit | Attending: Obstetrics & Gynecology | Admitting: Obstetrics & Gynecology

## 2015-01-25 DIAGNOSIS — N8500 Endometrial hyperplasia, unspecified: Secondary | ICD-10-CM | POA: Diagnosis not present

## 2015-01-25 DIAGNOSIS — Z01812 Encounter for preprocedural laboratory examination: Secondary | ICD-10-CM | POA: Diagnosis present

## 2015-01-25 LAB — CBC
HCT: 32.2 % — ABNORMAL LOW (ref 36.0–46.0)
HEMOGLOBIN: 10 g/dL — AB (ref 12.0–15.0)
MCH: 22.2 pg — ABNORMAL LOW (ref 26.0–34.0)
MCHC: 31.1 g/dL (ref 30.0–36.0)
MCV: 71.4 fL — ABNORMAL LOW (ref 78.0–100.0)
Platelets: 503 10*3/uL — ABNORMAL HIGH (ref 150–400)
RBC: 4.51 MIL/uL (ref 3.87–5.11)
RDW: 17.2 % — ABNORMAL HIGH (ref 11.5–15.5)
WBC: 8.7 10*3/uL (ref 4.0–10.5)

## 2015-01-25 NOTE — Patient Instructions (Addendum)
Your procedure is scheduled on: January 30, 2015   Enter through the Main Entrance of [facility] at: 11:45 am   Pick up the phone at the desk and dial [phone].  Call this number if you have problems the morning of surgery: [phone].  Remember: Do NOT eat food: after midnight on Monday night  Do NOT drink clear liquids after: 9:15 am day of surgery  Take these medicines the morning of surgery with a SIP OF WATER: none   Do NOT wear jewelry (body piercing), metal hair clips/bobby pins, make-up, or nail polish. Do NOT wear lotions, powders, or perfumes.  You may wear deoderant. Do NOT shave for 48 hours prior to surgery. Do NOT bring valuables to the hospital. Contacts, dentures, or bridgework may not be worn into surgery. Have a responsible adult drive you home and stay with you for 24 hours after your procedure

## 2015-01-26 NOTE — Anesthesia Preprocedure Evaluation (Addendum)
Anesthesia Evaluation  Patient identified by MRN, date of birth, ID band Patient awake    Reviewed: Allergy & Precautions, NPO status , Patient's Chart, lab work & pertinent test results  Airway Mallampati: II   Neck ROM: Full    Dental  (+) Teeth Intact, Dental Advisory Given, Chipped,    Pulmonary neg pulmonary ROS,    breath sounds clear to auscultation       Cardiovascular negative cardio ROS   Rhythm:Regular     Neuro/Psych negative neurological ROS  negative psych ROS   GI/Hepatic negative GI ROS,   Endo/Other  diabetesMorbid obesitySuper morbid obesity, BMI 72  Renal/GU      Musculoskeletal   Abdominal (+) + obese,   Peds  Hematology  (+) anemia , 10/32   Anesthesia Other Findings   Reproductive/Obstetrics                         Anesthesia Physical Anesthesia Plan  ASA: III  Anesthesia Plan: General   Post-op Pain Management:    Induction: Intravenous  Airway Management Planned: Oral ETT  Additional Equipment:   Intra-op Plan:   Post-operative Plan: Extubation in OR  Informed Consent: I have reviewed the patients History and Physical, chart, labs and discussed the procedure including the risks, benefits and alternatives for the proposed anesthesia with the patient or authorized representative who has indicated his/her understanding and acceptance.     Plan Discussed with:   Anesthesia Plan Comments: (Glide available)        Anesthesia Quick Evaluation

## 2015-01-29 MED ORDER — CEFAZOLIN SODIUM 10 G IJ SOLR
3.0000 g | INTRAMUSCULAR | Status: AC
  Administered 2015-01-30: 3 g via INTRAVENOUS
  Filled 2015-01-29: qty 3000

## 2015-01-29 NOTE — H&P (Signed)
Krista White is an 34 y.o. female with long standing oligomenorrhea, with complex endometrial hyperplasia with atypia on office endometrial biopsy. Pelvic sono noted thick endometrial stripe after weeks of bleeding.  Moderate obesity, PCOS. Single at present, desires future childbearing.   .No LMP recorded.    Past Medical History  Diagnosis Date  . Anemia   . Obesity   . Diabetes mellitus without complication ([place])     pt states she was pre diabetic but is no longer. Takes no meds and does not check her blood sugar.     No past surgical history on file.  Family History  Problem Relation Age of Onset  . Cancer Mother   . Diabetes Father     Social History:  reports that she has never smoked. She has never used smokeless tobacco. She reports that she does not drink alcohol or use illicit drugs.  Allergies:  Allergies  Allergen Reactions  . Latex Itching  . Pineapple     Mouth itches  . Lactase Diarrhea    No prescriptions prior to admission    ROS  none  There were no vitals taken for this visit. Physical Exam BP 137/81 mmHg  Pulse 86  Temp(Src) 98.3 F (36.8 C) (Oral)  Resp 20  SpO2 99%  A&O x 3, no acute distress. Pleasant HEENT neg, no thyromegaly Lungs CTA bilat CV RRR, s1s2 normal Abdo soft, non tender, non acute Extr no edema/ tenderness Pelvic Normal uterus and ovaries     Assessment/Plan: 34 yo female G0, with complex atypical endometrial hyperplasia. Here for hysteroscopy and D&C to assess for endometrial cancer.  Risks/complications of surgery reviewed incl infection, bleeding, damage to internal organs including bladder, bowels, ureters, blood vessels, other risks from anesthesia, VTE and delayed complications of any surgery, complications in future surgery reviewed.   [name] R 01/29/2015, 9:01 PM

## 2015-01-30 ENCOUNTER — Ambulatory Visit (HOSPITAL_COMMUNITY)
Admission: RE | Admit: 2015-01-30 | Discharge: 2015-01-30 | Disposition: A | Discharge status: Home / Self Care | Payer: 59 | Source: Ambulatory Visit | Attending: Obstetrics & Gynecology | Admitting: Obstetrics & Gynecology

## 2015-01-30 ENCOUNTER — Ambulatory Visit (HOSPITAL_COMMUNITY): Payer: 59 | Admitting: Anesthesiology

## 2015-01-30 ENCOUNTER — Encounter (HOSPITAL_COMMUNITY)
Admission: RE | Disposition: A | Discharge status: Home / Self Care | Payer: Self-pay | Source: Ambulatory Visit | Attending: Obstetrics & Gynecology

## 2015-01-30 ENCOUNTER — Encounter (HOSPITAL_COMMUNITY): Payer: Self-pay | Admitting: *Deleted

## 2015-01-30 DIAGNOSIS — N915 Oligomenorrhea, unspecified: Secondary | ICD-10-CM | POA: Insufficient documentation

## 2015-01-30 DIAGNOSIS — N8502 Endometrial intraepithelial neoplasia [EIN]: Secondary | ICD-10-CM | POA: Diagnosis present

## 2015-01-30 DIAGNOSIS — Z6841 Body Mass Index (BMI) 40.0 and over, adult: Secondary | ICD-10-CM | POA: Insufficient documentation

## 2015-01-30 HISTORY — PX: HYSTEROSCOPY WITH D & C: SHX1775

## 2015-01-30 HISTORY — PX: HYSTEROSCOPY W/D&C: SHX1775

## 2015-01-30 LAB — GLUCOSE, CAPILLARY: GLUCOSE-CAPILLARY: 86 mg/dL (ref 65–99)

## 2015-01-30 LAB — PREGNANCY, URINE: Preg Test, Ur: NEGATIVE

## 2015-01-30 SURGERY — DILATATION AND CURETTAGE /HYSTEROSCOPY
Anesthesia: General | Site: Vagina

## 2015-01-30 MED ORDER — PROPOFOL 10 MG/ML IV BOLUS
INTRAVENOUS | Status: AC
  Filled 2015-01-30: qty 40

## 2015-01-30 MED ORDER — LIDOCAINE HCL (CARDIAC) 20 MG/ML IV SOLN
INTRAVENOUS | Status: DC | PRN
  Administered 2015-01-30: 100 mg via INTRAVENOUS

## 2015-01-30 MED ORDER — ONDANSETRON HCL 4 MG/2ML IJ SOLN
INTRAMUSCULAR | Status: AC
  Filled 2015-01-30: qty 2

## 2015-01-30 MED ORDER — MIDAZOLAM HCL 2 MG/2ML IJ SOLN
INTRAMUSCULAR | Status: AC
  Filled 2015-01-30: qty 2

## 2015-01-30 MED ORDER — KETOROLAC TROMETHAMINE 30 MG/ML IJ SOLN
INTRAMUSCULAR | Status: AC
  Filled 2015-01-30: qty 1

## 2015-01-30 MED ORDER — ONDANSETRON HCL 4 MG/2ML IJ SOLN
INTRAMUSCULAR | Status: DC | PRN
  Administered 2015-01-30: 4 mg via INTRAVENOUS

## 2015-01-30 MED ORDER — FENTANYL CITRATE (PF) 250 MCG/5ML IJ SOLN
INTRAMUSCULAR | Status: AC
  Filled 2015-01-30: qty 5

## 2015-01-30 MED ORDER — FENTANYL CITRATE (PF) 100 MCG/2ML IJ SOLN: 25.0000 ug | INTRAMUSCULAR | Status: DC | PRN

## 2015-01-30 MED ORDER — PROMETHAZINE HCL 25 MG/ML IJ SOLN: 6.2500 mg | INTRAMUSCULAR | Status: DC | PRN

## 2015-01-30 MED ORDER — LACTATED RINGERS IV SOLN
INTRAVENOUS | Status: DC
  Administered 2015-01-30 (×3): via INTRAVENOUS

## 2015-01-30 MED ORDER — FENTANYL CITRATE (PF) 100 MCG/2ML IJ SOLN
INTRAMUSCULAR | Status: AC
  Filled 2015-01-30: qty 2

## 2015-01-30 MED ORDER — DEXAMETHASONE SODIUM PHOSPHATE 10 MG/ML IJ SOLN
INTRAMUSCULAR | Status: AC
  Filled 2015-01-30: qty 1

## 2015-01-30 MED ORDER — FENTANYL CITRATE (PF) 100 MCG/2ML IJ SOLN
INTRAMUSCULAR | Status: DC | PRN
  Administered 2015-01-30 (×3): 50 ug via INTRAVENOUS

## 2015-01-30 MED ORDER — SUCCINYLCHOLINE CHLORIDE 20 MG/ML IJ SOLN
INTRAMUSCULAR | Status: AC
  Filled 2015-01-30: qty 1

## 2015-01-30 MED ORDER — GLYCOPYRROLATE 0.2 MG/ML IJ SOLN
INTRAMUSCULAR | Status: AC
  Filled 2015-01-30: qty 1

## 2015-01-30 MED ORDER — GLYCOPYRROLATE 0.2 MG/ML IJ SOLN
INTRAMUSCULAR | Status: DC | PRN
  Administered 2015-01-30: 0.1 mg via INTRAVENOUS

## 2015-01-30 MED ORDER — PROPOFOL 10 MG/ML IV BOLUS
INTRAVENOUS | Status: DC | PRN
  Administered 2015-01-30: 250 mg via INTRAVENOUS

## 2015-01-30 MED ORDER — SODIUM CHLORIDE 0.9 % IR SOLN
Status: DC | PRN
  Administered 2015-01-30: 3000 mL

## 2015-01-30 MED ORDER — SUCCINYLCHOLINE CHLORIDE 20 MG/ML IJ SOLN
INTRAMUSCULAR | Status: DC | PRN
  Administered 2015-01-30: 150 mg via INTRAVENOUS

## 2015-01-30 MED ORDER — SCOPOLAMINE 1 MG/3DAYS TD PT72
MEDICATED_PATCH | TRANSDERMAL | Status: AC
  Administered 2015-01-30: 1.5 mg via TRANSDERMAL
  Filled 2015-01-30: qty 1

## 2015-01-30 MED ORDER — ACETAMINOPHEN 10 MG/ML IV SOLN
1000.0000 mg | Freq: Once | INTRAVENOUS | Status: AC
  Administered 2015-01-30: 1000 mg via INTRAVENOUS
  Filled 2015-01-30: qty 100

## 2015-01-30 MED ORDER — LIDOCAINE HCL (CARDIAC) 20 MG/ML IV SOLN
INTRAVENOUS | Status: AC
  Filled 2015-01-30: qty 5

## 2015-01-30 MED ORDER — DEXAMETHASONE SODIUM PHOSPHATE 10 MG/ML IJ SOLN
INTRAMUSCULAR | Status: DC | PRN
  Administered 2015-01-30: 4 mg via INTRAVENOUS

## 2015-01-30 MED ORDER — MIDAZOLAM HCL 2 MG/2ML IJ SOLN
INTRAMUSCULAR | Status: DC | PRN
  Administered 2015-01-30: 1 mg via INTRAVENOUS

## 2015-01-30 MED ORDER — SCOPOLAMINE 1 MG/3DAYS TD PT72
1.0000 | MEDICATED_PATCH | Freq: Once | TRANSDERMAL | Status: DC
  Administered 2015-01-30: 1.5 mg via TRANSDERMAL

## 2015-01-30 MED ORDER — MEPERIDINE HCL 25 MG/ML IJ SOLN: 6.2500 mg | INTRAMUSCULAR | Status: DC | PRN

## 2015-01-30 SURGICAL SUPPLY — 19 items
CANISTER SUCT 3000ML (MISCELLANEOUS) ×3 IMPLANT
CATH ROBINSON RED A/P 16FR (CATHETERS) IMPLANT
CATH SILICONE 16FRX5CC (CATHETERS) ×3 IMPLANT
CLOTH BEACON ORANGE TIMEOUT ST (SAFETY) ×3 IMPLANT
CONTAINER PREFILL 10% NBF 60ML (FORM) ×3 IMPLANT
ELECT REM PT RETURN 9FT ADLT (ELECTROSURGICAL) ×3
ELECTRODE REM PT RTRN 9FT ADLT (ELECTROSURGICAL) ×1 IMPLANT
ELECTRODE ROLLER VERSAPOINT (ELECTRODE) IMPLANT
ELECTRODE RT ANGLE VERSAPOINT (CUTTING LOOP) IMPLANT
GLOVE BIO SURGEON STRL SZ7 (GLOVE) ×3 IMPLANT
GLOVE BIOGEL PI IND STRL 7.0 (GLOVE) ×3 IMPLANT
GLOVE BIOGEL PI INDICATOR 7.0 (GLOVE) ×6
GOWN STRL REUS W/TWL LRG LVL3 (GOWN DISPOSABLE) ×6 IMPLANT
PACK VAGINAL MINOR WOMEN LF (CUSTOM PROCEDURE TRAY) ×3 IMPLANT
PAD OB MATERNITY 4.3X12.25 (PERSONAL CARE ITEMS) ×3 IMPLANT
TOWEL OR 17X24 6PK STRL BLUE (TOWEL DISPOSABLE) ×6 IMPLANT
TUBING AQUILEX INFLOW (TUBING) ×3 IMPLANT
TUBING AQUILEX OUTFLOW (TUBING) ×3 IMPLANT
WATER STERILE IRR 1000ML POUR (IV SOLUTION) ×3 IMPLANT

## 2015-01-30 NOTE — Discharge Instructions (Addendum)
Hysteroscopy, Care After °Refer to this sheet in the next few weeks. These instructions provide you with information on caring for yourself after your procedure. Your health care provider may also give you more specific instructions. Your treatment has been planned according to current medical practices, but problems sometimes occur. Call your health care provider if you have any problems or questions after your procedure.  °WHAT TO EXPECT AFTER THE PROCEDURE °After your procedure, it is typical to have the following: °· You may have some cramping. This normally lasts for a couple days. °· You may have bleeding. This can vary from light spotting for a few days to menstrual-like bleeding for 3-7 days. °HOME CARE INSTRUCTIONS °· Rest for the first 1-2 days after the procedure. °· Only take over-the-counter or prescription medicines as directed by your health care provider. Do not take aspirin. It can increase the chances of bleeding. °· Take showers instead of baths for 2 weeks or as directed by your health care provider. °· Do not drive for 24 hours or as directed. °· Do not drink alcohol while taking pain medicine. °· Do not use tampons, douche, or have sexual intercourse for 2 weeks or until your health care provider says it is okay. °· Take your temperature twice a day for 4-5 days. Write it down each time. °· Follow your health care provider's advice about diet, exercise, and lifting. °· If you develop constipation, you may: °¨ Take a mild laxative if your health care provider approves. °¨ Add bran foods to your diet. °¨ Drink enough fluids to keep your urine clear or pale yellow. °· Try to have someone with you or available to you for the first 24-48 hours, especially if you were given a general anesthetic. °· Follow up with your health care provider as directed. °SEEK MEDICAL CARE IF: °· You feel dizzy or lightheaded. °· You feel sick to your stomach (nauseous). °· You have abnormal vaginal discharge. °· You  have a rash. °· You have pain that is not controlled with medicine. °SEEK IMMEDIATE MEDICAL CARE IF: °· You have bleeding that is heavier than a normal menstrual period. °· You have a fever. °· You have increasing cramps or pain, not controlled with medicine. °· You have new belly (abdominal) pain. °· You pass out. °· You have pain in the tops of your shoulders (shoulder strap areas). °· You have shortness of breath. °  °This information is not intended to replace advice given to you by your health care provider. Make sure you discuss any questions you have with your health care provider. °  °Document Released: 11/10/2012 Document Reviewed: 11/10/2012 °Elsevier Interactive Patient Education ©2016 [employer] ° ° °Post Anesthesia Home Care Instructions ° °Activity: °Get plenty of rest for the remainder of the day. A responsible adult should stay with you for 24 hours following the procedure.  °For the next 24 hours, DO NOT: °-Drive a car °-Operate machinery °-Drink alcoholic beverages °-Take any medication unless instructed by your physician °-Make any legal decisions or sign important papers. ° °Meals: °Start with liquid foods such as gelatin or soup. Progress to regular foods as tolerated. Avoid greasy, spicy, heavy foods. If nausea and/or vomiting occur, drink only clear liquids until the nausea and/or vomiting subsides. Call your physician if vomiting continues. ° °Special Instructions/Symptoms: °Your throat may feel dry or sore from the anesthesia or the breathing tube placed in your throat during surgery. If this causes discomfort, gargle with warm salt water. The   discomfort should disappear within 24 hours. ° °If you had a scopolamine patch placed behind your ear for the management of post- operative nausea and/or vomiting: ° °1. The medication in the patch is effective for 72 hours, after which it should be removed.  Wrap patch in a tissue and discard in the trash. Wash hands thoroughly with soap and  water. °2. You may remove the patch earlier than 72 hours if you experience unpleasant side effects which may include dry mouth, dizziness or visual disturbances. °3. Avoid touching the patch. Wash your hands with soap and water after contact with the patch. °  ° ° °

## 2015-01-30 NOTE — Transfer of Care (Signed)
Immediate Anesthesia Transfer of Care Note  Patient: Krista White  Procedure(s) Performed: Procedure(s): DILATATION AND CURETTAGE /HYSTEROSCOPY (N/A)  Patient Location: PACU  Anesthesia Type:General  Level of Consciousness: awake, alert , oriented and patient cooperative  Airway & Oxygen Therapy: Patient Spontanous Breathing and Patient connected to face mask oxygen  Post-op Assessment: Report given to RN and Post -op Vital signs reviewed and stable  Post vital signs: Reviewed and stable  Last Vitals:  Filed Vitals:   01/30/15 1102 01/30/15 1419  BP: 137/81 164/92  Pulse: 86 92  Temp: 36.8 C 37.1 C  Resp: 20 16    Complications: No apparent anesthesia complications

## 2015-01-30 NOTE — Anesthesia Procedure Notes (Signed)
Procedure Name: Intubation Date/Time: 01/30/2015 1:32 PM Performed by: Georgeanne Nim Pre-anesthesia Checklist: Patient identified, Emergency Drugs available, Suction available, Patient being monitored and Timeout performed Patient Re-evaluated:Patient Re-evaluated prior to inductionOxygen Delivery Method: Circle system utilized Preoxygenation: Pre-oxygenation with 100% oxygen Intubation Type: IV induction and Rapid sequence Ventilation: Mask ventilation without difficulty Laryngoscope Size: Mac and 3 Grade View: Grade II Tube type: Oral Tube size: 7.0 mm Number of attempts: 1 Airway Equipment and Method: Stylet Placement Confirmation: ETT inserted through vocal cords under direct vision,  breath sounds checked- equal and bilateral and positive ETCO2 Secured at: 21 cm Tube secured with: Tape

## 2015-01-30 NOTE — Anesthesia Postprocedure Evaluation (Signed)
Anesthesia Post Note  Patient: Krista White  Procedure(s) Performed: Procedure(s) (LRB): DILATATION AND CURETTAGE /HYSTEROSCOPY (N/A)  Patient location during evaluation: PACU Anesthesia Type: General Level of consciousness: awake and alert Pain management: pain level controlled Vital Signs Assessment: post-procedure vital signs reviewed and stable Respiratory status: spontaneous breathing, nonlabored ventilation, respiratory function stable and patient connected to nasal cannula oxygen Cardiovascular status: blood pressure returned to baseline and stable Postop Assessment: no signs of nausea or vomiting Anesthetic complications: no    Last Vitals:  Filed Vitals:   01/30/15 1102 01/30/15 1419  BP: 137/81 164/92  Pulse: 86 92  Temp: 36.8 C 37.1 C  Resp: 20 16    Last Pain: There were no vitals filed for this visit.               Porschea Borys

## 2015-01-30 NOTE — Op Note (Signed)
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  Postop diagnosis: as above.  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 Morbid obesity and lifelong oligomenorrhea. Hysteroscopy and D&C advised for more tissue sampling. Patient was counseled on risks/ complications including infection, bleeding, damage to internal organs, she understood and agrees, gave informed written consent.  Patient was brought to the operating room with IV running. Time out was carried out. She received preop 3 gm Ancef. She underwent general endotracheal anesthesia without complications. She was given dorsolithotomy position. Parts were prepped and draped in standard fashion. Bladder was catheterized once.  Speculum was placed and cervix was grasped with single-tooth tenaculum. The uterus was sounded to 8 cm. Cervical os was dilated to 21 [place] dilator easily. Hysteroscope was introduced in the uterine cavity under vision, using saline for irrigation.  Findings: Thick endometrial tissue but no specific polyps or lesions noted. Tubal ostii seen with difficulty.  Hysteroscope was removed. Endometrial curettage was performed with sharp curette. All tissue sent to path. Hysteroscopy reinserted and cavity reevaluated, no complications. All instruments removed, fluid deficit 200 cc.   All counts are correct x2. No complications. Patient was made supine dorsal anesthesia and brought to the recovery room in stable condition.  Patient will be discharged home today. Discharge meds:  Resume Megace. Follow up in 2 weeks in office. Warning signs of infection and  excessive bleeding reviewed.   V.Belle Charlie, MD.

## 2015-01-31 ENCOUNTER — Encounter (HOSPITAL_COMMUNITY): Payer: Self-pay | Admitting: Obstetrics & Gynecology

## 2015-01-31 LAB — GLUCOSE, CAPILLARY: GLUCOSE-CAPILLARY: 101 mg/dL — AB (ref 65–99)

## 2015-02-19 ENCOUNTER — Ambulatory Visit: Payer: 59 | Admitting: Gynecologic Oncology

## 2015-02-21 ENCOUNTER — Ambulatory Visit: Payer: 59 | Attending: Gynecologic Oncology | Admitting: Gynecologic Oncology

## 2015-02-21 ENCOUNTER — Encounter: Payer: Self-pay | Admitting: Gynecologic Oncology

## 2015-02-21 VITALS — BP 151/76 | HR 75 | Temp 98.2°F | Resp 18 | Ht 63.0 in | Wt >= 6400 oz

## 2015-02-21 DIAGNOSIS — Z6841 Body Mass Index (BMI) 40.0 and over, adult: Secondary | ICD-10-CM | POA: Diagnosis not present

## 2015-02-21 DIAGNOSIS — C541 Malignant neoplasm of endometrium: Secondary | ICD-10-CM | POA: Insufficient documentation

## 2015-02-21 HISTORY — DX: Malignant neoplasm of endometrium: C54.1

## 2015-02-21 NOTE — Patient Instructions (Signed)
You will have a CT scan of the abdomen and pelvis.  We will contact you with the results.                Preparing for your Surgery  Plan for surgery on February 16 with Dr. Everitt Amber at [place] will be scheduled for a robotic assisted total hysterectomy, bilateral salpingo-oophorectomy, possible IUD placement in unable to perform hysterectomy.    Pre-operative Testing -You will receive a phone call from presurgical testing at [facility] to arrange for a pre-operative testing appointment before your surgery.  This appointment normally occurs one to two weeks before your scheduled surgery.   -Bring your insurance card, copy of an advanced directive if applicable, medication list  -At that visit, you will be asked to sign a consent for a possible blood transfusion in case a transfusion becomes necessary during surgery.  The need for a blood transfusion is rare but having consent is a necessary part of your care.     -You should not be taking blood thinners or aspirin at least ten days prior to surgery unless instructed by your surgeon.  Day Before Surgery at [place] will be asked to take in only clear liquids the day before surgery.  Examples of clear liquids include broths, jello, and clear juices.  Avoid carbonated beverages.  You will be advised to have nothing to eat or drink after midnight the evening before.    Your role in recovery Your role is to become active as soon as directed by your doctor, while still giving yourself time to heal.  Rest when you feel tired. You will be asked to do the following in order to speed your recovery:  - Cough and breathe deeply. This helps toclear and expand your lungs and can prevent pneumonia. You may be given a spirometer to practice deep breathing. A staff member will show you how to use the spirometer. - Do mild physical activity. Walking or moving your legs help your circulation and body functions return to normal. A  staff member will help you when you try to walk and will provide you with simple exercises. Do not try to get up or walk alone the first time. - Actively manage your pain. Managing your pain lets you move in comfort. We will ask you to rate your pain on a scale of zero to 10. It is your responsibility to tell your doctor or nurse where and how much you hurt so your pain can be treated.  Special Considerations -If you are diabetic, you may be placed on insulin after surgery to have closer control over your blood sugars to promote healing and recovery.  This does not mean that you will be discharged on insulin.  If applicable, your oral antidiabetics will be resumed when you are tolerating a solid diet.  -Your final pathology results from surgery should be available by the Friday after surgery and the results will be relayed to you when available.  Blood Transfusion Information WHAT IS A BLOOD TRANSFUSION? A transfusion is the replacement of blood or some of its parts. Blood is made up of multiple cells which provide different functions.  Red blood cells carry oxygen and are used for blood loss replacement.  White blood cells fight against infection.  Platelets control bleeding.  Plasma helps clot blood.  Other blood products are available for specialized needs, such as hemophilia or other clotting disorders. BEFORE THE TRANSFUSION  Who gives  blood for transfusions?   You may be able to donate blood to be used at a later date on yourself (autologous donation).  Relatives can be asked to donate blood. This is generally not any safer than if you have received blood from a stranger. The same precautions are taken to ensure safety when a relative's blood is donated.  Healthy volunteers who are fully evaluated to make sure their blood is safe. This is blood bank blood. Transfusion therapy is the safest it has ever been in the practice of medicine. Before blood is taken from a donor, a complete  history is taken to make sure that person has no history of diseases nor engages in risky social behavior (examples are intravenous drug use or sexual activity with multiple partners). The donor's travel history is screened to minimize risk of transmitting infections, such as malaria. The donated blood is tested for signs of infectious diseases, such as HIV and hepatitis. The blood is then tested to be sure it is compatible with you in order to minimize the chance of a transfusion reaction. If you or a relative donates blood, this is often done in anticipation of surgery and is not appropriate for emergency situations. It takes many days to process the donated blood. RISKS AND COMPLICATIONS Although transfusion therapy is very safe and saves many lives, the main dangers of transfusion include:   Getting an infectious disease.  Developing a transfusion reaction. This is an allergic reaction to something in the blood you were given. Every precaution is taken to prevent this. The decision to have a blood transfusion has been considered carefully by your caregiver before blood is given. Blood is not given unless the benefits outweigh the risks.

## 2015-02-21 NOTE — Progress Notes (Signed)
Consult Note: Gyn-Onc  Consult was requested by Dr. Benjie Karvonen for the evaluation of Krista White 35 y.o. female  CC:  Chief Complaint  Patient presents with  . endometrial cancer    Assessment/Plan:  Krista White  is a 35 y.o.  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  I spent 45 minutes in face to face counseling with the patient and her friends and reviewed her diagnosis and potential treatment options including fertility preserving options.  I discussed with the patient that the ideal treatment for a moderate grade endometrial cancer is surgery with hysterectomy. However this prevents future fertility and carries with it considerable risk due to her extreme morbid obesity (risk for surgical complications, infection, inability to visualize visceral structures, and anesthesia complications). An alternative to surgery would be Mirena IUD for progestin. I discussed that this achieves regression of cancer in only 11% of patients. 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  The patient is electing for a trial of surgery. She was informed that if hysterectomy is not technically feasible due to inadequate visualization or inability to intubate/ventilate the patient, we will instead place an IUD and refer her for consultation with bariatric surgery and reattempt hysterectomy after she has lost weight.  I discussed the potential risks of surgery including  bleeding, infection, damage to internal organs (such as bladder,ureters, bowels), blood clot, reoperation and rehospitalization. I discussed that these risks are substantially elevated for her due to her obesity.  We will order a CT abdo/pelvis preoperatively to rule out apparent metastatic disease. She is not a candidate for lymphadenectomy due  to her size. We will limit surgery to a robotic hysterectomy, BSO. She may require minilaparotomy for specimen removal pending uterus size.   HPI: Krista White is a 35 year old G0 who is seen in consultation at the request of Dr Benjie Karvonen for grade 2 endometrial cancer.  The patient has a long standing history of abnormal uterine bleeding and intermenstrual bleeding (since menarche). She has been obese all her life, but is at her heaviest now (BMI 72kg/m2 with 406lb weight). She has primary infertility. She is not currently in a relationship.  Due to her progressive bleeding issues, she underwent a D&C with Dr Benjie Karvonen (under general anesthesia with no issues) on 01/30/15. 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   She has no prior abdominal surgeries and denies other medical diagnoses.  Current Meds:  Outpatient Encounter Prescriptions as of 02/21/2015  Medication Sig  . megestrol (MEGACE) 40 MG tablet Take 80 mg by mouth 2 (two) times daily.  . meloxicam (MOBIC) 15 MG tablet Take 1 tablet (15 mg total) by mouth daily. For pain (Patient not taking: Reported on 01/23/2015)   No facility-administered encounter medications on file as of 02/21/2015.    Allergy:  Allergies  Allergen Reactions  . Asa [Aspirin] Hives  . Latex Itching  . Pineapple     Mouth itches  . Lactase Diarrhea    Social Hx:   Social History   Social History  . Marital Status: Single    Spouse Name: N/A  . Number of Children: N/A  . Years of Education: N/A   Occupational History  . Not on file.   Social History Main Topics  . Smoking status: Never Smoker   .  Smokeless tobacco: Never Used  . Alcohol Use: No  . Drug Use: No  . Sexual Activity: Not on file   Other Topics Concern  . Not on file   Social History Narrative    Past Surgical Hx:  Past Surgical History  Procedure Laterality Date  . Hysteroscopy w/d&c N/A 01/30/2015    Procedure: DILATATION AND CURETTAGE  /HYSTEROSCOPY;  Surgeon: Azucena Fallen, MD;  Location: [facility];  Service: Gynecology;  Laterality: N/A;    Past Medical Hx:  Past Medical History  Diagnosis Date  . Anemia   . Obesity   . Diabetes mellitus without complication ([place])     pt states she was pre diabetic but is no longer. Takes no meds and does not check her blood sugar.     Past Gynecological History:  G0. Primary infertility.  No LMP recorded (lmp unknown).  Family Hx:  Family History  Problem Relation Age of Onset  . Cancer Mother   . Diabetes Father     Review of Systems:  Constitutional  Feels well,    ENT Normal appearing ears and nares bilaterally Skin/Breast  No rash, sores, jaundice, itching, dryness Cardiovascular  No chest pain, shortness of breath, or edema  Pulmonary  No cough or wheeze.  Gastro Intestinal  No nausea, vomitting, or diarrhoea. No bright red blood per rectum, no abdominal pain, change in bowel movement, or constipation.  Genito Urinary  No frequency, urgency, dysuria,  Musculo Skeletal  No myalgia, arthralgia, joint swelling or pain  Neurologic  No weakness, numbness, change in gait,  Psychology  No depression, anxiety, insomnia.   Vitals:  Blood pressure 151/76, pulse 75, temperature 98.2 F (36.8 C), temperature source Oral, resp. rate 18, height 5\' 3"  (1.6 m), weight 405 lb (183.707 kg), SpO2 100 %.  Physical Exam: WD in NAD Neck  Supple NROM, without any enlargements.  Lymph Node Survey No cervical supraclavicular or inguinal adenopathy Cardiovascular  Pulse normal rate, regularity and rhythm. S1 and S2 normal.  Lungs  Clear to auscultation bilateraly, without wheezes/crackles/rhonchi. Good air movement.  Skin  No rash/lesions/breakdown  Psychiatry  Alert and oriented to person, place, and time  Abdomen  Normoactive bowel sounds, abdomen soft, non-tender and obese without evidence of hernia. Pannus. Back No CVA tenderness Genito Urinary  Vulva/vagina: Normal  external female genitalia.  No lesions. No discharge or bleeding.  Bladder/urethra:  No lesions or masses, well supported bladder  Vagina: grossly normal  Cervix: Normal appearing, no lesions. Somewhat difficult to visualize due to body habitus.  Uterus: Unable to appreciate uterine size due to body habitus.   Adnexa: no palpable masses. Rectal  Good tone, no masses no cul de sac nodularity.  Extremities  No bilateral cyanosis, clubbing or edema.   Donaciano Eva, MD  02/21/2015, 1:20 PM

## 2015-02-22 ENCOUNTER — Other Ambulatory Visit: Payer: Self-pay | Admitting: Gynecologic Oncology

## 2015-02-22 ENCOUNTER — Encounter: Payer: Self-pay | Admitting: Gynecologic Oncology

## 2015-02-22 DIAGNOSIS — C541 Malignant neoplasm of endometrium: Secondary | ICD-10-CM

## 2015-02-22 MED ORDER — LEVONORGESTREL 20 MCG/24HR IU IUD: 1.0000 | INTRAUTERINE_SYSTEM | Freq: Once | INTRAUTERINE | Status: DC

## 2015-02-22 NOTE — Progress Notes (Signed)
On the phone with the patient's insurance for over thirty minutes about obtaining an IUD for her upcoming surgery in case a hysterectomy was unable to be performed.  Insurance stating that IUD is a plan exclusion medication and there is no way to get a prior auth or perform a peer to peer.  Insurance company stated the patient can call member services and change her plan.  Planning to see if the IUD can be obtained through the [facility] and billed to her hospital account.  IUD cost at the Outpatient Pharmacy ranging from $650-700.  Will follow up.

## 2015-02-26 ENCOUNTER — Encounter: Payer: Self-pay | Admitting: Gynecologic Oncology

## 2015-02-26 NOTE — Progress Notes (Signed)
[facility] send req to me for prior auth for dr. Denman George. See prev notes. I sent copy of notes to them to show no prior auth or peer to peer can be done per insurance

## 2015-03-05 ENCOUNTER — Ambulatory Visit (HOSPITAL_COMMUNITY)
Admission: RE | Admit: 2015-03-05 | Discharge: 2015-03-05 | Disposition: A | Discharge status: Home / Self Care | Payer: 59 | Source: Ambulatory Visit | Attending: Gynecologic Oncology | Admitting: Gynecologic Oncology

## 2015-03-05 ENCOUNTER — Telehealth: Payer: Self-pay | Admitting: Gynecologic Oncology

## 2015-03-05 ENCOUNTER — Encounter (HOSPITAL_COMMUNITY): Payer: Self-pay

## 2015-03-05 DIAGNOSIS — C541 Malignant neoplasm of endometrium: Secondary | ICD-10-CM | POA: Diagnosis not present

## 2015-03-05 MED ORDER — IOHEXOL 300 MG/ML  SOLN
100.0000 mL | Freq: Once | INTRAMUSCULAR | Status: AC | PRN
  Administered 2015-03-05: 100 mL via INTRAVENOUS

## 2015-03-05 NOTE — Telephone Encounter (Signed)
Called and spoke with the patient about her upcoming procedure with Dr. Everitt Amber.  Plan is to attempt a hysterectomy but if unable to perform due to habitus, plan was to insert an IUD.  Patient's insurance is denying coverage of the IUD itself.  Patient informed that the Mirena out of pocket would cost around $650 and the [place] IUD around $559 at the [facility]  Patient stating she would not be able to afford that.  Alternative option per Dr. Denman George was for oral hormones instead.  Patient agreeable with the plan.  No concerns voiced.  Advised to call for any needs or concerns.

## 2015-03-14 ENCOUNTER — Telehealth: Payer: Self-pay | Admitting: Gynecologic Oncology

## 2015-03-14 NOTE — Telephone Encounter (Signed)
Left message with patient about her CT scan.  Advised to call the office to discuss results/answer questions if any.

## 2015-03-15 ENCOUNTER — Encounter (HOSPITAL_COMMUNITY)
Admission: RE | Admit: 2015-03-15 | Discharge: 2015-03-15 | Disposition: A | Discharge status: Home / Self Care | Payer: 59 | Source: Ambulatory Visit | Attending: Gynecologic Oncology | Admitting: Gynecologic Oncology

## 2015-03-15 ENCOUNTER — Ambulatory Visit (HOSPITAL_COMMUNITY)
Admission: RE | Admit: 2015-03-15 | Discharge: 2015-03-15 | Disposition: A | Discharge status: Home / Self Care | Payer: 59 | Source: Ambulatory Visit | Attending: Gynecologic Oncology | Admitting: Gynecologic Oncology

## 2015-03-15 ENCOUNTER — Encounter (HOSPITAL_COMMUNITY): Payer: Self-pay

## 2015-03-15 DIAGNOSIS — Z01818 Encounter for other preprocedural examination: Secondary | ICD-10-CM | POA: Insufficient documentation

## 2015-03-15 DIAGNOSIS — C541 Malignant neoplasm of endometrium: Secondary | ICD-10-CM | POA: Insufficient documentation

## 2015-03-15 DIAGNOSIS — E119 Type 2 diabetes mellitus without complications: Secondary | ICD-10-CM | POA: Diagnosis not present

## 2015-03-15 HISTORY — DX: Paresthesia of skin: R20.2

## 2015-03-15 HISTORY — DX: Nocturia: R35.1

## 2015-03-15 LAB — URINALYSIS, ROUTINE W REFLEX MICROSCOPIC
BILIRUBIN URINE: NEGATIVE
Glucose, UA: NEGATIVE mg/dL
Ketones, ur: NEGATIVE mg/dL
Leukocytes, UA: NEGATIVE
NITRITE: NEGATIVE
PROTEIN: NEGATIVE mg/dL
SPECIFIC GRAVITY, URINE: 1.026 (ref 1.005–1.030)
pH: 5.5 (ref 5.0–8.0)

## 2015-03-15 LAB — COMPREHENSIVE METABOLIC PANEL
ALK PHOS: 83 U/L (ref 38–126)
ALT: 19 U/L (ref 14–54)
AST: 19 U/L (ref 15–41)
Albumin: 3.8 g/dL (ref 3.5–5.0)
Anion gap: 10 (ref 5–15)
BUN: 14 mg/dL (ref 6–20)
CHLORIDE: 106 mmol/L (ref 101–111)
CO2: 23 mmol/L (ref 22–32)
CREATININE: 0.69 mg/dL (ref 0.44–1.00)
Calcium: 8.9 mg/dL (ref 8.9–10.3)
Glucose, Bld: 102 mg/dL — ABNORMAL HIGH (ref 65–99)
Potassium: 4 mmol/L (ref 3.5–5.1)
SODIUM: 139 mmol/L (ref 135–145)
Total Bilirubin: 0.3 mg/dL (ref 0.3–1.2)
Total Protein: 7.5 g/dL (ref 6.5–8.1)

## 2015-03-15 LAB — CBC WITH DIFFERENTIAL/PLATELET
BASOS PCT: 0 %
Basophils Absolute: 0 10*3/uL (ref 0.0–0.1)
EOS PCT: 1 %
Eosinophils Absolute: 0.1 10*3/uL (ref 0.0–0.7)
HCT: 33.6 % — ABNORMAL LOW (ref 36.0–46.0)
HEMOGLOBIN: 10.3 g/dL — AB (ref 12.0–15.0)
LYMPHS PCT: 39 %
Lymphs Abs: 3.2 10*3/uL (ref 0.7–4.0)
MCH: 22.5 pg — AB (ref 26.0–34.0)
MCHC: 30.7 g/dL (ref 30.0–36.0)
MCV: 73.5 fL — AB (ref 78.0–100.0)
Monocytes Absolute: 0.5 10*3/uL (ref 0.1–1.0)
Monocytes Relative: 6 %
NEUTROS PCT: 54 %
Neutro Abs: 4.3 10*3/uL (ref 1.7–7.7)
Platelets: 417 10*3/uL — ABNORMAL HIGH (ref 150–400)
RBC: 4.57 MIL/uL (ref 3.87–5.11)
RDW: 17 % — ABNORMAL HIGH (ref 11.5–15.5)
WBC: 8.1 10*3/uL (ref 4.0–10.5)

## 2015-03-15 LAB — ABO/RH: ABO/RH(D): A POS

## 2015-03-15 LAB — URINE MICROSCOPIC-ADD ON

## 2015-03-15 LAB — PREGNANCY, URINE: Preg Test, Ur: NEGATIVE

## 2015-03-15 NOTE — Patient Instructions (Signed)
Krista White  03/15/2015   Your procedure is scheduled on: Thursday March 22, 2015   Report to [facility] Main  Entrance take [place]  elevators to 3rd floor to  [place] at 7:45 AM.  Call this number if you have problems the morning of surgery [phone]   Remember: ONLY 1 PERSON MAY GO WITH YOU TO SHORT STAY TO GET  READY MORNING OF [place].  Do not eat food or drink liquids :After Midnight.  CLEAR LIQUID DIET DAY PRIOR TO SURGICAL PROCDURE (03/21/2015) NO CARBONATED BEVERAGES.      Take these medicines the morning of surgery with A SIP OF WATER: none DO NOT TAKE ANY DIABETIC MEDICATIONS DAY OF YOUR SURGERY                               You may not have any metal on your body including hair pins and              piercings  Do not wear jewelry, make-up, lotions, powders or perfumes, deodorant             Do not wear nail polish.  Do not shave  48 hours prior to surgery.               Do not bring valuables to the hospital. [place].  Contacts, dentures or bridgework may not be worn into surgery.  Leave suitcase in the car. After surgery it may be brought to your room.                Please read over the following fact sheets you were given:INCENTIVE SPIROMETER; BLOOD TRANSFUSION INFORMATION SHEET  _____________________________________________________________________             [facility] - Preparing for Surgery Before surgery, you can play an important role.  Because skin is not sterile, your skin needs to be as free of germs as possible.  You can reduce the number of germs on your skin by washing with CHG (chlorahexidine gluconate) soap before surgery.  CHG is an antiseptic cleaner which kills germs and bonds with the skin to continue killing germs even after washing. Please DO NOT use if you have an allergy to CHG or antibacterial soaps.  If your skin becomes reddened/irritated stop using  the CHG and inform your nurse when you arrive at Short Stay. Do not shave (including legs and underarms) for at least 48 hours prior to the first CHG shower.  You may shave your face/neck. Please follow these instructions carefully:  1.  Shower with CHG Soap the night before surgery and the  morning of Surgery.  2.  If you choose to wash your hair, wash your hair first as usual with your  normal  shampoo.  3.  After you shampoo, rinse your hair and body thoroughly to remove the  shampoo.                           4.  Use CHG as you would any other liquid soap.  You can apply chg directly  to the skin and wash  Gently with a scrungie or clean washcloth.  5.  Apply the CHG Soap to your body ONLY FROM THE NECK DOWN.   Do not use on face/ open                           Wound or open sores. Avoid contact with eyes, ears mouth and genitals (private parts).                       Wash face,  Genitals (private parts) with your normal soap.             6.  Wash thoroughly, paying special attention to the area where your surgery  will be performed.  7.  Thoroughly rinse your body with warm water from the neck down.  8.  DO NOT shower/wash with your normal soap after using and rinsing off  the CHG Soap.                9.  Pat yourself dry with a clean towel.            10.  Wear clean pajamas.            11.  Place clean sheets on your bed the night of your first shower and do not  sleep with pets. Day of Surgery : Do not apply any lotions/deodorants the morning of surgery.  Please wear clean clothes to the hospital/surgery center.  FAILURE TO FOLLOW THESE INSTRUCTIONS MAY RESULT IN THE CANCELLATION OF YOUR SURGERY PATIENT SIGNATURE_________________________________  NURSE SIGNATURE__________________________________  ________________________________________________________________________   Adam Phenix  An incentive spirometer is a tool that can help keep your lungs  clear and active. This tool measures how well you are filling your lungs with each breath. Taking long deep breaths may help reverse or decrease the chance of developing breathing (pulmonary) problems (especially infection) following:  A long period of time when you are unable to move or be active. BEFORE THE PROCEDURE   If the spirometer includes an indicator to show your best effort, your nurse or respiratory therapist will set it to a desired goal.  If possible, sit up straight or lean slightly forward. Try not to slouch.  Hold the incentive spirometer in an upright position. INSTRUCTIONS FOR USE   Sit on the edge of your bed if possible, or sit up as far as you can in bed or on a chair.  Hold the incentive spirometer in an upright position.  Breathe out normally.  Place the mouthpiece in your mouth and seal your lips tightly around it.  Breathe in slowly and as deeply as possible, raising the piston or the ball toward the top of the column.  Hold your breath for 3-5 seconds or for as long as possible. Allow the piston or ball to fall to the bottom of the column.  Remove the mouthpiece from your mouth and breathe out normally.  Rest for a few seconds and repeat Steps 1 through 7 at least 10 times every 1-2 hours when you are awake. Take your time and take a few normal breaths between deep breaths.  The spirometer may include an indicator to show your best effort. Use the indicator as a goal to work toward during each repetition.  After each set of 10 deep breaths, practice coughing to be sure your lungs are clear. If you have an incision (the cut made at the time of surgery),  support your incision when coughing by placing a pillow or rolled up towels firmly against it. Once you are able to get out of bed, walk around indoors and cough well. You may stop using the incentive spirometer when instructed by your caregiver.  RISKS AND COMPLICATIONS  Take your time so you do not get  dizzy or light-headed.  If you are in pain, you may need to take or ask for pain medication before doing incentive spirometry. It is harder to take a deep breath if you are having pain. AFTER USE  Rest and breathe slowly and easily.  It can be helpful to keep track of a log of your progress. Your caregiver can provide you with a simple table to help with this. If you are using the spirometer at home, follow these instructions: [place] IF:   You are having difficultly using the spirometer.  You have trouble using the spirometer as often as instructed.  Your pain medication is not giving enough relief while using the spirometer.  You develop fever of 100.5 F (38.1 C) or higher. SEEK IMMEDIATE MEDICAL CARE IF:   You cough up bloody sputum that had not been present before.  You develop fever of 102 F (38.9 C) or greater.  You develop worsening pain at or near the incision site. MAKE SURE YOU:   Understand these instructions.  Will watch your condition.  Will get help right away if you are not doing well or get worse. Document Released: 06/02/2006 Document Revised: 04/14/2011 Document Reviewed: 08/03/2006 ExitCare Patient Information 2014 ExitCare, [place].   ________________________________________________________________________  WHAT IS A BLOOD TRANSFUSION? Blood Transfusion Information  A transfusion is the replacement of blood or some of its parts. Blood is made up of multiple cells which provide different functions.  Red blood cells carry oxygen and are used for blood loss replacement.  White blood cells fight against infection.  Platelets control bleeding.  Plasma helps clot blood.  Other blood products are available for specialized needs, such as hemophilia or other clotting disorders. BEFORE THE TRANSFUSION  Who gives blood for transfusions?   Healthy volunteers who are fully evaluated to make sure their blood is safe. This is blood bank  blood. Transfusion therapy is the safest it has ever been in the practice of medicine. Before blood is taken from a donor, a complete history is taken to make sure that person has no history of diseases nor engages in risky social behavior (examples are intravenous drug use or sexual activity with multiple partners). The donor's travel history is screened to minimize risk of transmitting infections, such as malaria. The donated blood is tested for signs of infectious diseases, such as HIV and hepatitis. The blood is then tested to be sure it is compatible with you in order to minimize the chance of a transfusion reaction. If you or a relative donates blood, this is often done in anticipation of surgery and is not appropriate for emergency situations. It takes many days to process the donated blood. RISKS AND COMPLICATIONS Although transfusion therapy is very safe and saves many lives, the main dangers of transfusion include:   Getting an infectious disease.  Developing a transfusion reaction. This is an allergic reaction to something in the blood you were given. Every precaution is taken to prevent this. The decision to have a blood transfusion has been considered carefully by your caregiver before blood is given. Blood is not given unless the benefits outweigh the risks. AFTER THE TRANSFUSION  Right after receiving a blood transfusion, you will usually feel much better and more energetic. This is especially true if your red blood cells have gotten low (anemic). The transfusion raises the level of the red blood cells which carry oxygen, and this usually causes an energy increase.  The nurse administering the transfusion will monitor you carefully for complications. HOME CARE INSTRUCTIONS  No special instructions are needed after a transfusion. You may find your energy is better. Speak with your caregiver about any limitations on activity for underlying diseases you may have. SEEK MEDICAL CARE IF:    Your condition is not improving after your transfusion.  You develop redness or irritation at the intravenous (IV) site. SEEK IMMEDIATE MEDICAL CARE IF:  Any of the following symptoms occur over the next 12 hours:  Shaking chills.  You have a temperature by mouth above 102 F (38.9 C), not controlled by medicine.  Chest, back, or muscle pain.  People around you feel you are not acting correctly or are confused.  Shortness of breath or difficulty breathing.  Dizziness and fainting.  You get a rash or develop hives.  You have a decrease in urine output.  Your urine turns a dark color or changes to pink, red, or brown. Any of the following symptoms occur over the next 10 days:  You have a temperature by mouth above 102 F (38.9 C), not controlled by medicine.  Shortness of breath.  Weakness after normal activity.  The white part of the eye turns yellow (jaundice).  You have a decrease in the amount of urine or are urinating less often.  Your urine turns a dark color or changes to pink, red, or brown. Document Released: 01/18/2000 Document Revised: 04/14/2011 Document Reviewed: 09/06/2007 ExitCare Patient Information 2014 ExitCare, [place].  _______________________________________________________________________    CLEAR LIQUID DIET   Foods Allowed                                                                     Foods Excluded  Coffee and tea, regular and decaf                             liquids that you cannot  Plain Jell-O in any flavor                                             see through such as: Fruit ices (not with fruit pulp)                                     milk, soups, orange juice  Iced Popsicles                                    All solid food                           Cranberry, grape and apple juices Sports  drinks like Gatorade Lightly seasoned clear broth or consume(fat free) Sugar, honey syrup  Sample Menu Breakfast                                 Lunch                                     Supper Cranberry juice                    Beef broth                            Chicken broth Jell-O                                     Grape juice                           Apple juice Coffee or tea                        Jell-O                                      Popsicle                                                Coffee or tea                        Coffee or tea  _____________________________________________________________________

## 2015-03-15 NOTE — Progress Notes (Signed)
Spoke with Krista White in portable equipment in regards to need for bariatric bed day of surgery. Information provided.

## 2015-03-16 LAB — HEMOGLOBIN A1C
HEMOGLOBIN A1C: 6.6 % — AB (ref 4.8–5.6)
Mean Plasma Glucose: 143 mg/dL

## 2015-03-16 NOTE — Progress Notes (Signed)
Spoke with Dr Corliss Skains in regards to EKG/epic per PAT visit 03/15/2015. No orders given. Anesthesia to see pt day of surgery.

## 2015-03-16 NOTE — Progress Notes (Signed)
A1C results in epic per PAT visit 03/15/2015 sent to Dr Denman George

## 2015-03-21 MED ORDER — DEXTROSE 5 % IV SOLN
3.0000 g | INTRAVENOUS | Status: AC
  Administered 2015-03-22: 3 g via INTRAVENOUS
  Filled 2015-03-21: qty 3000

## 2015-03-22 ENCOUNTER — Ambulatory Visit (HOSPITAL_COMMUNITY): Payer: 59 | Admitting: Anesthesiology

## 2015-03-22 ENCOUNTER — Encounter (HOSPITAL_COMMUNITY): Payer: Self-pay | Admitting: *Deleted

## 2015-03-22 ENCOUNTER — Encounter (HOSPITAL_COMMUNITY)
Admission: RE | Disposition: A | Discharge status: Home / Self Care | Payer: Self-pay | Source: Ambulatory Visit | Attending: Gynecologic Oncology

## 2015-03-22 ENCOUNTER — Ambulatory Visit (HOSPITAL_COMMUNITY)
Admission: RE | Admit: 2015-03-22 | Discharge: 2015-03-23 | Disposition: A | Discharge status: Home / Self Care | Payer: 59 | Source: Ambulatory Visit | Attending: Gynecologic Oncology | Admitting: Gynecologic Oncology

## 2015-03-22 DIAGNOSIS — D649 Anemia, unspecified: Secondary | ICD-10-CM | POA: Diagnosis not present

## 2015-03-22 DIAGNOSIS — C542 Malignant neoplasm of myometrium: Secondary | ICD-10-CM | POA: Diagnosis not present

## 2015-03-22 DIAGNOSIS — E119 Type 2 diabetes mellitus without complications: Secondary | ICD-10-CM | POA: Diagnosis not present

## 2015-03-22 DIAGNOSIS — N800 Endometriosis of uterus: Secondary | ICD-10-CM | POA: Insufficient documentation

## 2015-03-22 DIAGNOSIS — Y838 Other surgical procedures as the cause of abnormal reaction of the patient, or of later complication, without mention of misadventure at the time of the procedure: Secondary | ICD-10-CM | POA: Insufficient documentation

## 2015-03-22 DIAGNOSIS — S30814A Abrasion of vagina and vulva, initial encounter: Secondary | ICD-10-CM | POA: Insufficient documentation

## 2015-03-22 DIAGNOSIS — C541 Malignant neoplasm of endometrium: Secondary | ICD-10-CM | POA: Diagnosis present

## 2015-03-22 DIAGNOSIS — Z6841 Body Mass Index (BMI) 40.0 and over, adult: Secondary | ICD-10-CM

## 2015-03-22 HISTORY — PX: ROBOTIC ASSISTED TOTAL HYSTERECTOMY WITH BILATERAL SALPINGO OOPHERECTOMY: SHX6086

## 2015-03-22 LAB — GLUCOSE, CAPILLARY
GLUCOSE-CAPILLARY: 112 mg/dL — AB (ref 65–99)
GLUCOSE-CAPILLARY: 141 mg/dL — AB (ref 65–99)
GLUCOSE-CAPILLARY: 102 mg/dL — AB (ref 65–99)
Glucose-Capillary: 156 mg/dL — ABNORMAL HIGH (ref 65–99)

## 2015-03-22 LAB — TYPE AND SCREEN
ABO/RH(D): A POS
ANTIBODY SCREEN: NEGATIVE

## 2015-03-22 SURGERY — HYSTERECTOMY, TOTAL, ROBOT-ASSISTED, LAPAROSCOPIC, WITH BILATERAL SALPINGO-OOPHORECTOMY
Anesthesia: General | Site: Abdomen

## 2015-03-22 MED ORDER — FENTANYL CITRATE (PF) 100 MCG/2ML IJ SOLN
INTRAMUSCULAR | Status: AC
  Filled 2015-03-22: qty 2

## 2015-03-22 MED ORDER — OXYMETAZOLINE HCL 0.05 % NA SOLN
NASAL | Status: DC | PRN
  Administered 2015-03-22: 1 via NASAL

## 2015-03-22 MED ORDER — ONDANSETRON HCL 4 MG/2ML IJ SOLN: 4.0000 mg | Freq: Four times a day (QID) | INTRAMUSCULAR | Status: DC | PRN

## 2015-03-22 MED ORDER — KCL IN DEXTROSE-NACL 20-5-0.45 MEQ/L-%-% IV SOLN
INTRAVENOUS | Status: DC
  Administered 2015-03-22: 50 mL/h via INTRAVENOUS
  Filled 2015-03-22 (×2): qty 1000

## 2015-03-22 MED ORDER — ONDANSETRON HCL 4 MG/2ML IJ SOLN
INTRAMUSCULAR | Status: AC
Start: 2015-03-22 — End: 2015-03-22
  Filled 2015-03-22: qty 2

## 2015-03-22 MED ORDER — SUGAMMADEX SODIUM 500 MG/5ML IV SOLN
INTRAVENOUS | Status: AC
  Filled 2015-03-22: qty 5

## 2015-03-22 MED ORDER — SUGAMMADEX SODIUM 500 MG/5ML IV SOLN
INTRAVENOUS | Status: DC | PRN
  Administered 2015-03-22: 700 mg via INTRAVENOUS

## 2015-03-22 MED ORDER — DICLOFENAC SODIUM 50 MG PO TBEC
50.0000 mg | DELAYED_RELEASE_TABLET | Freq: Three times a day (TID) | ORAL | Status: DC
  Administered 2015-03-22 – 2015-03-23 (×3): 50 mg via ORAL
  Filled 2015-03-22 (×5): qty 1

## 2015-03-22 MED ORDER — ENOXAPARIN SODIUM 40 MG/0.4ML ~~LOC~~ SOLN
40.0000 mg | SUBCUTANEOUS | Status: DC
Start: 2015-03-23 — End: 2015-03-23
  Administered 2015-03-23: 40 mg via SUBCUTANEOUS
  Filled 2015-03-22 (×2): qty 0.4

## 2015-03-22 MED ORDER — SUCCINYLCHOLINE CHLORIDE 20 MG/ML IJ SOLN
INTRAMUSCULAR | Status: DC | PRN
  Administered 2015-03-22: 130 mg via INTRAVENOUS

## 2015-03-22 MED ORDER — MIDAZOLAM HCL 2 MG/2ML IJ SOLN
INTRAMUSCULAR | Status: AC
  Filled 2015-03-22: qty 2

## 2015-03-22 MED ORDER — ONDANSETRON HCL 4 MG/2ML IJ SOLN
INTRAMUSCULAR | Status: DC | PRN
  Administered 2015-03-22: 4 mg via INTRAVENOUS

## 2015-03-22 MED ORDER — PROPOFOL 10 MG/ML IV BOLUS
INTRAVENOUS | Status: AC
  Filled 2015-03-22: qty 20

## 2015-03-22 MED ORDER — ACETAMINOPHEN 10 MG/ML IV SOLN
1000.0000 mg | Freq: Once | INTRAVENOUS | Status: AC
  Administered 2015-03-22: 1000 mg via INTRAVENOUS

## 2015-03-22 MED ORDER — LIDOCAINE HCL (CARDIAC) 20 MG/ML IV SOLN
INTRAVENOUS | Status: DC | PRN
  Administered 2015-03-22: 50 mg via INTRAVENOUS

## 2015-03-22 MED ORDER — GLYCOPYRROLATE 0.2 MG/ML IJ SOLN
INTRAMUSCULAR | Status: AC
  Filled 2015-03-22: qty 1

## 2015-03-22 MED ORDER — FENTANYL CITRATE (PF) 100 MCG/2ML IJ SOLN
INTRAMUSCULAR | Status: DC | PRN
  Administered 2015-03-22 (×2): 25 ug via INTRAVENOUS
  Administered 2015-03-22 (×2): 50 ug via INTRAVENOUS
  Administered 2015-03-22 (×3): 25 ug via INTRAVENOUS
  Administered 2015-03-22: 100 ug via INTRAVENOUS
  Administered 2015-03-22: 50 ug via INTRAVENOUS
  Administered 2015-03-22 (×3): 25 ug via INTRAVENOUS

## 2015-03-22 MED ORDER — GABAPENTIN 300 MG PO CAPS
600.0000 mg | ORAL_CAPSULE | Freq: Every day | ORAL | Status: AC
  Administered 2015-03-22: 600 mg via ORAL
  Filled 2015-03-22: qty 2

## 2015-03-22 MED ORDER — ROCURONIUM BROMIDE 100 MG/10ML IV SOLN
INTRAVENOUS | Status: AC
Start: 2015-03-22 — End: 2015-03-22
  Filled 2015-03-22: qty 1

## 2015-03-22 MED ORDER — LACTATED RINGERS IV SOLN
INTRAVENOUS | Status: DC | PRN
  Administered 2015-03-22 (×2): via INTRAVENOUS

## 2015-03-22 MED ORDER — OXYCODONE-ACETAMINOPHEN 5-325 MG PO TABS
1.0000 | ORAL_TABLET | ORAL | Status: DC | PRN
  Administered 2015-03-22: 1 via ORAL
  Administered 2015-03-23: 2 via ORAL
  Filled 2015-03-22: qty 1
  Filled 2015-03-22: qty 2

## 2015-03-22 MED ORDER — METOCLOPRAMIDE HCL 5 MG/ML IJ SOLN
INTRAMUSCULAR | Status: AC
  Filled 2015-03-22: qty 2

## 2015-03-22 MED ORDER — HYDROMORPHONE HCL 1 MG/ML IJ SOLN
0.2000 mg | INTRAMUSCULAR | Status: AC | PRN
  Administered 2015-03-22 – 2015-03-23 (×2): 0.5 mg via INTRAVENOUS
  Filled 2015-03-22 (×2): qty 1

## 2015-03-22 MED ORDER — FENTANYL CITRATE (PF) 100 MCG/2ML IJ SOLN
25.0000 ug | INTRAMUSCULAR | Status: DC | PRN
  Administered 2015-03-22: 50 ug via INTRAVENOUS
  Administered 2015-03-22 (×3): 25 ug via INTRAVENOUS

## 2015-03-22 MED ORDER — FENTANYL CITRATE (PF) 250 MCG/5ML IJ SOLN
INTRAMUSCULAR | Status: AC
  Filled 2015-03-22: qty 5

## 2015-03-22 MED ORDER — LABETALOL HCL 5 MG/ML IV SOLN
INTRAVENOUS | Status: AC
  Filled 2015-03-22: qty 4

## 2015-03-22 MED ORDER — LACTATED RINGERS IR SOLN
Status: DC | PRN
  Administered 2015-03-22: 1000 mL

## 2015-03-22 MED ORDER — 0.9 % SODIUM CHLORIDE (POUR BTL) OPTIME
TOPICAL | Status: DC | PRN
  Administered 2015-03-22: 1000 mL

## 2015-03-22 MED ORDER — ENOXAPARIN SODIUM 40 MG/0.4ML ~~LOC~~ SOLN
40.0000 mg | SUBCUTANEOUS | Status: AC
  Administered 2015-03-22: 40 mg via SUBCUTANEOUS
  Filled 2015-03-22: qty 0.4

## 2015-03-22 MED ORDER — OXYMETAZOLINE HCL 0.05 % NA SOLN
NASAL | Status: AC
  Filled 2015-03-22: qty 15

## 2015-03-22 MED ORDER — METOCLOPRAMIDE HCL 5 MG/ML IJ SOLN
INTRAMUSCULAR | Status: DC | PRN
  Administered 2015-03-22: 10 mg via INTRAVENOUS

## 2015-03-22 MED ORDER — KETAMINE HCL 10 MG/ML IJ SOLN
INTRAMUSCULAR | Status: AC
  Filled 2015-03-22: qty 1

## 2015-03-22 MED ORDER — PROMETHAZINE HCL 25 MG/ML IJ SOLN: 6.2500 mg | INTRAMUSCULAR | Status: DC | PRN

## 2015-03-22 MED ORDER — ONDANSETRON HCL 4 MG/2ML IJ SOLN
INTRAMUSCULAR | Status: AC
  Filled 2015-03-22: qty 2

## 2015-03-22 MED ORDER — ONDANSETRON HCL 4 MG PO TABS: 4.0000 mg | ORAL_TABLET | Freq: Four times a day (QID) | ORAL | Status: DC | PRN

## 2015-03-22 MED ORDER — LIDOCAINE HCL (CARDIAC) 20 MG/ML IV SOLN
INTRAVENOUS | Status: AC
  Filled 2015-03-22: qty 5

## 2015-03-22 MED ORDER — ROCURONIUM BROMIDE 100 MG/10ML IV SOLN
INTRAVENOUS | Status: AC
  Filled 2015-03-22: qty 1

## 2015-03-22 MED ORDER — DEXAMETHASONE SODIUM PHOSPHATE 10 MG/ML IJ SOLN
INTRAMUSCULAR | Status: AC
  Filled 2015-03-22: qty 1

## 2015-03-22 MED ORDER — MIDAZOLAM HCL 5 MG/5ML IJ SOLN
INTRAMUSCULAR | Status: DC | PRN
  Administered 2015-03-22 (×2): 1 mg via INTRAVENOUS

## 2015-03-22 MED ORDER — ROCURONIUM BROMIDE 100 MG/10ML IV SOLN
INTRAVENOUS | Status: DC | PRN
  Administered 2015-03-22 (×3): 50 mg via INTRAVENOUS

## 2015-03-22 MED ORDER — INSULIN ASPART 100 UNIT/ML ~~LOC~~ SOLN: 0.0000 [IU] | Freq: Three times a day (TID) | SUBCUTANEOUS | Status: DC

## 2015-03-22 MED ORDER — STERILE WATER FOR IRRIGATION IR SOLN
Status: DC | PRN
  Administered 2015-03-22: 1000 mL

## 2015-03-22 MED ORDER — ACETAMINOPHEN 10 MG/ML IV SOLN
INTRAVENOUS | Status: AC
  Filled 2015-03-22: qty 100

## 2015-03-22 MED ORDER — PROPOFOL 10 MG/ML IV BOLUS
INTRAVENOUS | Status: DC | PRN
  Administered 2015-03-22: 250 mg via INTRAVENOUS
  Administered 2015-03-22: 30 mg via INTRAVENOUS

## 2015-03-22 MED ORDER — GLYCOPYRROLATE 0.2 MG/ML IJ SOLN
INTRAMUSCULAR | Status: DC | PRN
  Administered 2015-03-22 (×2): 0.1 mg via INTRAVENOUS

## 2015-03-22 SURGICAL SUPPLY — 54 items
APL ESCP 34 STRL LF DISP (HEMOSTASIS)
APPLICATOR SURGIFLO ENDO (HEMOSTASIS) IMPLANT
CHLORAPREP W/TINT 26ML (MISCELLANEOUS) ×4 IMPLANT
COVER SURGICAL LIGHT HANDLE (MISCELLANEOUS) IMPLANT
COVER TIP SHEARS 8 DVNC (MISCELLANEOUS) ×1 IMPLANT
COVER TIP SHEARS 8MM DA VINCI (MISCELLANEOUS) ×1
DRAPE ARM DVNC X/XI (DISPOSABLE) ×4 IMPLANT
DRAPE COLUMN DVNC XI (DISPOSABLE) ×1 IMPLANT
DRAPE DA VINCI XI ARM (DISPOSABLE) ×4
DRAPE DA VINCI XI COLUMN (DISPOSABLE) ×1
DRAPE SHEET LG 3/4 BI-LAMINATE (DRAPES) ×4 IMPLANT
DRAPE SURG IRRIG POUCH 19X23 (DRAPES) ×2 IMPLANT
ELECT REM PT RETURN 9FT ADLT (ELECTROSURGICAL) ×2
ELECTRODE REM PT RTRN 9FT ADLT (ELECTROSURGICAL) ×1 IMPLANT
GLOVE BIO SURGEON STRL SZ 6 (GLOVE) ×8 IMPLANT
GLOVE BIO SURGEON STRL SZ 6.5 (GLOVE) ×4 IMPLANT
GOWN STRL REUS W/ TWL LRG LVL3 (GOWN DISPOSABLE) ×2 IMPLANT
GOWN STRL REUS W/TWL LRG LVL3 (GOWN DISPOSABLE) ×4
HOLDER FOLEY CATH W/STRAP (MISCELLANEOUS) ×2 IMPLANT
KIT BASIN OR (CUSTOM PROCEDURE TRAY) ×2 IMPLANT
LIQUID BAND (GAUZE/BANDAGES/DRESSINGS) ×2 IMPLANT
MANIPULATOR UTERINE 4.5 ZUMI (MISCELLANEOUS) ×2 IMPLANT
MARKER SKIN DUAL TIP RULER LAB (MISCELLANEOUS) ×2 IMPLANT
OBTURATOR XI 8MM BLADELESS (TROCAR) ×2 IMPLANT
OCCLUDER COLPOPNEUMO (BALLOONS) ×2 IMPLANT
PACK PERINEAL COLD (PAD) ×6 IMPLANT
PAD POSITIONING PINK XL (MISCELLANEOUS) ×2 IMPLANT
PORT ACCESS TROCAR AIRSEAL 12 (TROCAR) ×1 IMPLANT
PORT ACCESS TROCAR AIRSEAL 5M (TROCAR) ×1
POUCH ENDO CATCH II 15MM (MISCELLANEOUS) IMPLANT
POUCH SPECIMEN RETRIEVAL 10MM (ENDOMECHANICALS) IMPLANT
SEAL CANN UNIV 5-8 DVNC XI (MISCELLANEOUS) ×4 IMPLANT
SEAL XI 5MM-8MM UNIVERSAL (MISCELLANEOUS) ×4
SET TRI-LUMEN FLTR TB AIRSEAL (TUBING) ×2 IMPLANT
SET TUBE IRRIG SUCTION NO TIP (IRRIGATION / IRRIGATOR) ×2 IMPLANT
SHEET LAVH (DRAPES) ×2 IMPLANT
SOLUTION ELECTROLUBE (MISCELLANEOUS) ×2 IMPLANT
SPONGE LAP 18X18 X RAY DECT (DISPOSABLE) ×2 IMPLANT
SURGIFLO W/THROMBIN 8M KIT (HEMOSTASIS) IMPLANT
SUT MNCRL AB 4-0 PS2 18 (SUTURE) ×4 IMPLANT
SUT VIC AB 0 CT1 27 (SUTURE) ×8
SUT VIC AB 0 CT1 27XBRD ANTBC (SUTURE) ×8 IMPLANT
SUT VIC AB 2-0 SH 27 (SUTURE) ×5
SUT VIC AB 2-0 SH 27X BRD (SUTURE) ×5 IMPLANT
SYR 50ML LL SCALE MARK (SYRINGE) ×2 IMPLANT
TOWEL OR 17X26 10 PK STRL BLUE (TOWEL DISPOSABLE) ×4 IMPLANT
TOWEL OR NON WOVEN STRL DISP B (DISPOSABLE) ×2 IMPLANT
TRAP SPECIMEN MUCOUS 40CC (MISCELLANEOUS) IMPLANT
TRAY FOLEY METER SIL LF 16FR (CATHETERS) ×2 IMPLANT
TRAY LAPAROSCOPIC (CUSTOM PROCEDURE TRAY) ×2 IMPLANT
TROCAR BLADELESS OPT 5 100 (ENDOMECHANICALS) ×2 IMPLANT
TROCAR XCEL 12X100 BLDLESS (ENDOMECHANICALS) ×2 IMPLANT
WATER STERILE IRR 1500ML POUR (IV SOLUTION) ×4 IMPLANT
YANKAUER SUCT BULB TIP 10FT TU (MISCELLANEOUS) ×2 IMPLANT

## 2015-03-22 NOTE — Interval H&P Note (Signed)
History and Physical Interval Note:  03/22/2015 9:04 AM  Krista White  has presented today for surgery, with the diagnosis of endometrial cancer  The various methods of treatment have been discussed with the patient and family. After consideration of risks, benefits and other options for treatment, the patient has consented to  Procedure(s): XI ROBOTIC ASSISTED TOTAL ABDOMINAL HYSTERECTOMY WITH BILATERAL SALPINGO OOPHORECTOMY (N/A) as a surgical intervention .  The patient's history has been reviewed, patient examined, no change in status, stable for surgery.  I have reviewed the patient's chart and labs.  Questions were answered to the patient's satisfaction.     Donaciano Eva

## 2015-03-22 NOTE — Transfer of Care (Signed)
Immediate Anesthesia Transfer of Care Note  Patient: Janayia A Province  Procedure(s) Performed: Procedure(s): XI ROBOTIC ASSISTED TOTAL ABDOMINAL HYSTERECTOMY FOR UTERUS GREATER THAN 250 GMS WITH BILATERAL SALPINGO OOPHORECTOMY (N/A)  Patient Location: PACU  Anesthesia Type:General  Level of Consciousness: Patient easily awoken, sedated, comfortable, cooperative, following commands, responds to stimulation.   Airway & Oxygen Therapy: Patient spontaneously breathing, ventilating well, oxygen via simple oxygen mask.  Post-op Assessment: Report given to PACU RN, vital signs reviewed and stable, moving all extremities.   Post vital signs: Reviewed and stable.  Complications: No apparent anesthesia complications

## 2015-03-22 NOTE — Anesthesia Preprocedure Evaluation (Addendum)
Anesthesia Evaluation  Patient identified by MRN, date of birth, ID band Patient awake    Reviewed: Allergy & Precautions, NPO status , Patient's Chart, lab work & pertinent test results  Airway Mallampati: II  TM Distance: >3 FB Neck ROM: Full    Dental no notable dental hx. (+)    Pulmonary neg pulmonary ROS,  CXR: Normal.   Pulmonary exam normal breath sounds clear to auscultation       Cardiovascular negative cardio ROS Normal cardiovascular exam Rhythm:Regular Rate:Normal     Neuro/Psych negative neurological ROS  negative psych ROS   GI/Hepatic negative GI ROS, Neg liver ROS,   Endo/Other  diabetes, Type 2Morbid obesity  Renal/GU negative Renal ROS  negative genitourinary   Musculoskeletal negative musculoskeletal ROS (+)   Abdominal (+) + obese,   Peds negative pediatric ROS (+)  Hematology  (+) anemia ,   Anesthesia Other Findings   Reproductive/Obstetrics negative OB ROS                          Anesthesia Physical Anesthesia Plan  ASA: III  Anesthesia Plan: General   Post-op Pain Management:    Induction: Intravenous  Airway Management Planned: Oral ETT  Additional Equipment:   Intra-op Plan:   Post-operative Plan: Extubation in OR  Informed Consent: I have reviewed the patients History and Physical, chart, labs and discussed the procedure including the risks, benefits and alternatives for the proposed anesthesia with the patient or authorized representative who has indicated his/her understanding and acceptance.   Dental advisory given  Plan Discussed with: CRNA  Anesthesia Plan Comments: (Airway OK on 01-30-15 at [facility])       Anesthesia Quick Evaluation

## 2015-03-22 NOTE — Anesthesia Postprocedure Evaluation (Signed)
Anesthesia Post Note  Patient: Krista White  Procedure(s) Performed: Procedure(s) (LRB): XI ROBOTIC ASSISTED TOTAL ABDOMINAL HYSTERECTOMY FOR UTERUS GREATER THAN 250 GMS WITH BILATERAL SALPINGO OOPHORECTOMY (N/A)  Patient location during evaluation: PACU Anesthesia Type: General Level of consciousness: awake and alert Pain management: pain level controlled Vital Signs Assessment: post-procedure vital signs reviewed and stable Respiratory status: spontaneous breathing, nonlabored ventilation, respiratory function stable and patient connected to nasal cannula oxygen Cardiovascular status: blood pressure returned to baseline and stable Postop Assessment: no signs of nausea or vomiting Anesthetic complications: no Comments: To floor on OSA precautions.    Last Vitals:  Filed Vitals:   03/22/15 1415 03/22/15 1453  BP: 126/79 147/83  Pulse: 67 85  Temp: 36.5 C 36.8 C  Resp: 17 18    Last Pain:  Filed Vitals:   03/22/15 1454  PainSc: 0-No pain                 Draden Cottingham J

## 2015-03-22 NOTE — Progress Notes (Signed)
5 w aware pt will be in room in 20 minutes.

## 2015-03-22 NOTE — Op Note (Signed)
OPERATIVE NOTE 03/22/15  Surgeon: Donaciano Eva   Assistants: Dr Lahoma Crocker (an MD assistant was necessary for tissue manipulation, management of robotic instrumentation, retraction and positioning due to the complexity of the case and hospital policies).   Anesthesia: General endotracheal anesthesia  ASA Class: 3   Pre-operative Diagnosis: grade 2 endometrioid endometrial cancer, extreme morbid obesity (BMI 70kg/m2)  Post-operative Diagnosis: same  Operation: Robotic-assisted laparoscopic total hysterectomy uterus >250gm) with bilateral salpingoophorectomy (22 modifier for extreme obesity)  Surgeon: Donaciano Eva  Assistant Surgeon: Lahoma Crocker MD  Anesthesia: GET  Urine Output: 200  Operative Findings:  : extreme intraperitoneal adiposity, 12cm uterus (343gms), normal appearing ovaries and tubes. No gross evidence for extrauterine disease.  Estimated Blood Loss:  100      Total IV Fluids: 1,000 ml         Specimens: uterus, cervix, bilateral tubes and ovaries., washings         Complications:  None; patient tolerated the procedure well.         Disposition: PACU - hemodynamically stable.  Procedure Details  The patient was seen in the Holding Room. The risks, benefits, complications, treatment options, and expected outcomes were discussed with the patient.  The patient concurred with the proposed plan, giving informed consent.  The site of surgery properly noted/marked. The patient was identified as Scientific laboratory technician and the procedure verified as a Robotic-assisted hysterectomy with bilateral salpingo oophorectomy. A Time Out was held and the above information confirmed.  After induction of anesthesia, the patient was draped and prepped in the usual sterile manner. Pt was placed in supine position after anesthesia and draped and prepped in the usual sterile manner. The abdominal drape was placed after the CholoraPrep had been allowed to dry for 3  minutes.  Her arms were tucked to her side with all appropriate precautions.  The shoulders were stabilized with padded shoulder blocks applied to the acromium processes.  The patient was placed in the semi-lithotomy position in [place].  The perineum was prepped with Betadine. The patient was then prepped. Foley catheter was placed.  A sterile speculum was placed in the vagina.  The cervix was grasped with a single-tooth tenaculum and dilated with Kennon Rounds dilators.  The ZUMI uterine manipulator with a medium colpotomizer ring was placed without difficulty.  A pneum occluder balloon was placed over the manipulator.  OG tube placement was confirmed and to suction.   Due to extreme adiposity, an additional 45 minutes was spent with patient positioning and additional OR personnel was required.  Next, a 5 mm skin incision was made 1 cm below the subcostal margin in the midclavicular line.  The 5 mm Optiview port and scope was used for direct entry.  Opening pressure was under 10 mm CO2.  The abdomen was insufflated and the findings were noted as above.   At this point and all points during the procedure, the patient's intra-abdominal pressure did not exceed 15 mmHg. Next, a 10 mm skin incision was made above the umbilicus and a right and left port was placed about 10 cm lateral to the robot port on the right and left side.  A fourth arm was placed in the left lower quadrant 2 cm above and superior and medial to the anterior superior iliac spine.  All ports were placed under direct visualization.  The patient was placed in steep Trendelenburg.  Bowel was folded away into the upper abdomen.  The robot was docked in the  normal manner.  The hysterectomy was started after the round ligament on the right side was incised and the retroperitoneum was entered and the pararectal space was developed.  The ureter was noted to be on the medial leaf of the broad ligament.  The peritoneum above the ureter was incised and  stretched and the infundibulopelvic ligament was skeletonized, cauterized and cut.  The posterior peritoneum was taken down to the level of the KOH ring.  The anterior peritoneum was also taken down.  The bladder flap was created to the level of the KOH ring.  The uterine artery on the right side was skeletonized, cauterized and cut in the normal manner.  A similar procedure was performed on the left.  The colpotomy was made and the uterus, cervix, bilateral ovaries and tubes were amputated and delivered through the vagina. It was a challenging specimen delivery due to the large size of the uterus and nulliparous vagina. In the process of removing the specimen, unavoidable vaginal and labial separation took place.  The upper vaginal cuff separation was closed with interrupted figure of 8 sutures to create hemostasis. The colpotomy at the vaginal cuff was closed with Vicryl on a CT1 needle in a running manner.  Irrigation was used and excellent hemostasis was achieved.  The perineal abrasions were then closed with 2-0 vicryl in running fashion to create hemostasis. Irrigation confirmed no additional bleeding.   At this point in the procedure was completed.  Robotic instruments were removed under direct visulaization.  The robot was undocked. The 10 mm ports were closed with Vicryl on a UR-5 needle and the fascia was closed with 0 Vicryl on a UR-5 needle.  The skin was closed with 4-0 Vicryl in a subcuticular manner.  Dermabond was applied.  Sponge, lap and needle counts correct x 2.  The patient was taken to the recovery room in stable condition.  The vagina was swabbed with no additional bleeding noted. All instrument and needle counts were correct x  3.   The patient was transferred to the recovery room in a stable condition.  Donaciano Eva, MD

## 2015-03-22 NOTE — H&P (View-Only) (Signed)
Consult Note: Gyn-Onc  Consult was requested by Dr. Benjie Karvonen for the evaluation of Krista White 35 y.o. female  CC:  Chief Complaint  Patient presents with  . endometrial cancer    Assessment/Plan:  Krista White  is a 35 y.o.  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  I spent 45 minutes in face to face counseling with the patient and her friends and reviewed her diagnosis and potential treatment options including fertility preserving options.  I discussed with the patient that the ideal treatment for a moderate grade endometrial cancer is surgery with hysterectomy. However this prevents future fertility and carries with it considerable risk due to her extreme morbid obesity (risk for surgical complications, infection, inability to visualize visceral structures, and anesthesia complications). An alternative to surgery would be Mirena IUD for progestin. I discussed that this achieves regression of cancer in only 11% of patients. 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  The patient is electing for a trial of surgery. She was informed that if hysterectomy is not technically feasible due to inadequate visualization or inability to intubate/ventilate the patient, we will instead place an IUD and refer her for consultation with bariatric surgery and reattempt hysterectomy after she has lost weight.  I discussed the potential risks of surgery including  bleeding, infection, damage to internal organs (such as bladder,ureters, bowels), blood clot, reoperation and rehospitalization. I discussed that these risks are substantially elevated for her due to her obesity.  We will order a CT abdo/pelvis preoperatively to rule out apparent metastatic disease. She is not a candidate for lymphadenectomy due  to her size. We will limit surgery to a robotic hysterectomy, BSO. She may require minilaparotomy for specimen removal pending uterus size.   HPI: Krista White is a 35 year old G0 who is seen in consultation at the request of Dr Benjie Karvonen for grade 2 endometrial cancer.  The patient has a long standing history of abnormal uterine bleeding and intermenstrual bleeding (since menarche). She has been obese all her life, but is at her heaviest now (BMI 72kg/m2 with 406lb weight). She has primary infertility. She is not currently in a relationship.  Due to her progressive bleeding issues, she underwent a D&C with Dr Benjie Karvonen (under general anesthesia with no issues) on 01/30/15. 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   She has no prior abdominal surgeries and denies other medical diagnoses.  Current Meds:  Outpatient Encounter Prescriptions as of 02/21/2015  Medication Sig  . megestrol (MEGACE) 40 MG tablet Take 80 mg by mouth 2 (two) times daily.  . meloxicam (MOBIC) 15 MG tablet Take 1 tablet (15 mg total) by mouth daily. For pain (Patient not taking: Reported on 01/23/2015)   No facility-administered encounter medications on file as of 02/21/2015.    Allergy:  Allergies  Allergen Reactions  . Asa [Aspirin] Hives  . Latex Itching  . Pineapple     Mouth itches  . Lactase Diarrhea    Social Hx:   Social History   Social History  . Marital Status: Single    Spouse Name: N/A  . Number of Children: N/A  . Years of Education: N/A   Occupational History  . Not on file.   Social History Main Topics  . Smoking status: Never Smoker   .  Smokeless tobacco: Never Used  . Alcohol Use: No  . Drug Use: No  . Sexual Activity: Not on file   Other Topics Concern  . Not on file   Social History Narrative    Past Surgical Hx:  Past Surgical History  Procedure Laterality Date  . Hysteroscopy w/d&c N/A 01/30/2015    Procedure: DILATATION AND CURETTAGE  /HYSTEROSCOPY;  Surgeon: Azucena Fallen, MD;  Location: [facility];  Service: Gynecology;  Laterality: N/A;    Past Medical Hx:  Past Medical History  Diagnosis Date  . Anemia   . Obesity   . Diabetes mellitus without complication ([place])     pt states she was pre diabetic but is no longer. Takes no meds and does not check her blood sugar.     Past Gynecological History:  G0. Primary infertility.  No LMP recorded (lmp unknown).  Family Hx:  Family History  Problem Relation Age of Onset  . Cancer Mother   . Diabetes Father     Review of Systems:  Constitutional  Feels well,    ENT Normal appearing ears and nares bilaterally Skin/Breast  No rash, sores, jaundice, itching, dryness Cardiovascular  No chest pain, shortness of breath, or edema  Pulmonary  No cough or wheeze.  Gastro Intestinal  No nausea, vomitting, or diarrhoea. No bright red blood per rectum, no abdominal pain, change in bowel movement, or constipation.  Genito Urinary  No frequency, urgency, dysuria,  Musculo Skeletal  No myalgia, arthralgia, joint swelling or pain  Neurologic  No weakness, numbness, change in gait,  Psychology  No depression, anxiety, insomnia.   Vitals:  Blood pressure 151/76, pulse 75, temperature 98.2 F (36.8 C), temperature source Oral, resp. rate 18, height 5\' 3"  (1.6 m), weight 405 lb (183.707 kg), SpO2 100 %.  Physical Exam: WD in NAD Neck  Supple NROM, without any enlargements.  Lymph Node Survey No cervical supraclavicular or inguinal adenopathy Cardiovascular  Pulse normal rate, regularity and rhythm. S1 and S2 normal.  Lungs  Clear to auscultation bilateraly, without wheezes/crackles/rhonchi. Good air movement.  Skin  No rash/lesions/breakdown  Psychiatry  Alert and oriented to person, place, and time  Abdomen  Normoactive bowel sounds, abdomen soft, non-tender and obese without evidence of hernia. Pannus. Back No CVA tenderness Genito Urinary  Vulva/vagina: Normal  external female genitalia.  No lesions. No discharge or bleeding.  Bladder/urethra:  No lesions or masses, well supported bladder  Vagina: grossly normal  Cervix: Normal appearing, no lesions. Somewhat difficult to visualize due to body habitus.  Uterus: Unable to appreciate uterine size due to body habitus.   Adnexa: no palpable masses. Rectal  Good tone, no masses no cul de sac nodularity.  Extremities  No bilateral cyanosis, clubbing or edema.   Donaciano Eva, MD  02/21/2015, 1:20 PM

## 2015-03-22 NOTE — Anesthesia Procedure Notes (Signed)
Procedure Name: Intubation Date/Time: 03/22/2015 10:08 AM Performed by: Deliah Boston Pre-anesthesia Checklist: Patient identified, Emergency Drugs available, Suction available and Patient being monitored Patient Re-evaluated:Patient Re-evaluated prior to inductionOxygen Delivery Method: Circle System Utilized Preoxygenation: Pre-oxygenation with 100% oxygen Intubation Type: IV induction Ventilation: Mask ventilation without difficulty Laryngoscope Size: Mac and 3 Grade View: Grade I Tube type: Oral Tube size: 7.5 mm Number of attempts: 2 Airway Equipment and Method: Stylet and Oral airway Placement Confirmation: ETT inserted through vocal cords under direct vision,  positive ETCO2 and breath sounds checked- equal and bilateral Secured at: 21 cm Tube secured with: Tape Dental Injury: Teeth and Oropharynx as per pre-operative assessment  Comments: Pt preoxygenated with 100% O2, smooth IV induction, easy mask, DVL x 1 by SRNA with MAC 3, epiglottis in view only. DVL by Dr. Delma Post with mac 3 grade 2 view. AOI with 7.5 ETT. + bil breath sounds, + etco2

## 2015-03-23 DIAGNOSIS — C541 Malignant neoplasm of endometrium: Secondary | ICD-10-CM | POA: Diagnosis not present

## 2015-03-23 LAB — BASIC METABOLIC PANEL
Anion gap: 8 (ref 5–15)
BUN: 11 mg/dL (ref 6–20)
CO2: 26 mmol/L (ref 22–32)
CREATININE: 0.77 mg/dL (ref 0.44–1.00)
Calcium: 8.5 mg/dL — ABNORMAL LOW (ref 8.9–10.3)
Chloride: 106 mmol/L (ref 101–111)
GFR calc Af Amer: >60 mL/min (ref >60)
Glucose, Bld: 112 mg/dL — ABNORMAL HIGH (ref 65–99)
Potassium: 4 mmol/L (ref 3.5–5.1)
SODIUM: 140 mmol/L (ref 135–145)

## 2015-03-23 LAB — CBC
HCT: 28 % — ABNORMAL LOW (ref 36.0–46.0)
Hemoglobin: 8.8 g/dL — ABNORMAL LOW (ref 12.0–15.0)
MCH: 23.2 pg — AB (ref 26.0–34.0)
MCHC: 31.4 g/dL (ref 30.0–36.0)
MCV: 73.9 fL — ABNORMAL LOW (ref 78.0–100.0)
PLATELETS: 348 10*3/uL (ref 150–400)
RBC: 3.79 MIL/uL — ABNORMAL LOW (ref 3.87–5.11)
RDW: 16.8 % — ABNORMAL HIGH (ref 11.5–15.5)
WBC: 9 10*3/uL (ref 4.0–10.5)

## 2015-03-23 LAB — GLUCOSE, CAPILLARY: GLUCOSE-CAPILLARY: 135 mg/dL — AB (ref 65–99)

## 2015-03-23 MED ORDER — OXYCODONE-ACETAMINOPHEN 5-325 MG PO TABS: 1.0000 | ORAL_TABLET | ORAL | Status: DC | PRN

## 2015-03-23 NOTE — Discharge Summary (Signed)
Physician Discharge Summary  Patient ID: Krista White MRN: [id number] DOB/AGE: [birth year] 35 y.o.  Admit date: 03/22/2015 Discharge date: 03/23/2015  Admission Diagnoses: Endometrial cancer [facility])  Discharge Diagnoses:  Principal Problem:   Endometrial cancer [facility]) Active Problems:   Morbid obesity with BMI of 70 and over, adult [facility])   Discharged Condition:  The patient is in good condition and stable for discharge.   Hospital Course: On 03/22/2015, the patient underwent the following: Procedure(s): XI ROBOTIC ASSISTED TOTAL ABDOMINAL HYSTERECTOMY FOR UTERUS GREATER THAN 250 GMS WITH BILATERAL SALPINGO OOPHORECTOMY.  The postoperative course was uneventful.  She was discharged to home on postoperative day 1 tolerating a regular diet, voiding, minimal pain, passing flatus.  Consults: None  Significant Diagnostic Studies: None  Treatments: surgery: see above  Discharge Exam: Blood pressure 124/69, pulse 78, temperature 98.5 F (36.9 C), temperature source Oral, resp. rate 16, height 5\' 4"  (1.626 m), weight 410 lb (185.975 kg), last menstrual period 03/22/2015, SpO2 100 %. General appearance: alert, cooperative and no distress Resp: clear to auscultation bilaterally Cardio: regular rate and rhythm, S1, S2 normal, no murmur, click, rub or gallop GI: soft, non-tender; bowel sounds normal; no masses,  no organomegaly Extremities: extremities normal, atraumatic, no cyanosis or edema Incision/Wound: Lap sites to the abdomen without erythema or drainage  Disposition: 01-Home or Self Care  Discharge Instructions    Call MD for:  difficulty breathing, headache or visual disturbances    Complete by:  As directed      Call MD for:  extreme fatigue    Complete by:  As directed      Call MD for:  hives    Complete by:  As directed      Call MD for:  persistant dizziness or light-headedness    Complete by:  As directed      Call MD for:  persistant nausea and vomiting    Complete by:   As directed      Call MD for:  redness, tenderness, or signs of infection (pain, swelling, redness, odor or green/yellow discharge around incision site)    Complete by:  As directed      Call MD for:  severe uncontrolled pain    Complete by:  As directed      Call MD for:  temperature >100.4    Complete by:  As directed      Diet - low sodium heart healthy    Complete by:  As directed      Driving Restrictions    Complete by:  As directed   No driving for 1 week.  Do not take narcotics and drive.     Increase activity slowly    Complete by:  As directed      Lifting restrictions    Complete by:  As directed   No lifting greater than 10 lbs.     Sexual Activity Restrictions    Complete by:  As directed   No sexual activity, nothing in the vagina, for 6 weeks.            Medication List    STOP taking these medications        levonorgestrel 20 MCG/24HR IUD  Commonly known as:  MIRENA     megestrol 40 MG tablet  Commonly known as:  MEGACE      TAKE these medications        meloxicam 15 MG tablet  Commonly known as:  MOBIC  Take 1 tablet (  15 mg total) by mouth daily. For pain     oxyCODONE-acetaminophen 5-325 MG tablet  Commonly known as:  PERCOCET/ROXICET  Take 1-2 tablets by mouth every 4 (four) hours as needed (moderate to severe pain).           Follow-up Information    Follow up with Donaciano Eva, MD On 04/09/2015.   Specialty:  Obstetrics and Gynecology   Why:  at 3:30pm at the [facility] information:   [place] [facility] [phone]       Greater than thirty minutes were spend for face to face discharge instructions and discharge orders/summary in EPIC.   Signed: CROSS, MELISSA DEAL 03/23/2015, 10:46 AM

## 2015-03-23 NOTE — Discharge Instructions (Signed)
03/23/2015 ° °Return to work: 4-6 weeks if applicable ° °Activity: °1. Be up and out of the bed during the day.  Take a nap if needed.  You may walk up steps but be careful and use the hand rail.  Stair climbing will tire you more than you think, you may need to stop part way and rest.  ° °2. No lifting or straining for 6 weeks. ° °3. No driving for 1 week(s).  Do not drive if you are taking narcotic pain medicine. ° °4. Shower daily.  Use soap and water on your incision and pat dry; don't rub.  No tub baths until cleared by your surgeon.  ° °5. No sexual activity and nothing in the vagina for 6 weeks. ° °6. You may experience a small amount of clear drainage from your incisions, which is normal.  If the drainage persists or increases, please call the office. ° ° °Diet: °1. Low sodium Heart Healthy Diet is recommended. ° °2. It is safe to use a laxative, such as Miralax or Colace, if you have difficulty moving your bowels.  ° °Wound Care: °1. Keep clean and dry.  Shower daily. ° °Reasons to call the Doctor: °· Fever - Oral temperature greater than 100.4 degrees Fahrenheit °· Foul-smelling vaginal discharge °· Difficulty urinating °· Nausea and vomiting °· Increased pain at the site of the incision that is unrelieved with pain medicine. °· Difficulty breathing with or without chest pain °· New calf pain especially if only on one side °· Sudden, continuing increased vaginal bleeding with or without clots. °  °Contacts: °For questions or concerns you should contact: ° °Dr. Emma Rossi at [phone] ° °Kailiana Granquist, NP at [phone] ° °After Hours: call [phone] and have the GYN Oncologist paged/contacted ° °Acetaminophen; Oxycodone tablets °What is this medicine? °ACETAMINOPHEN; OXYCODONE (a set a MEE noe fen; ox i KOE done) is a pain reliever. It is used to treat moderate to severe pain. °This medicine may be used for other purposes; ask your health care provider or pharmacist if you have questions. °What  should I tell my health care provider before I take this medicine? °They need to know if you have any of these conditions: °-brain tumor °-Crohn's disease, inflammatory bowel disease, or ulcerative colitis °-drug abuse or addiction °-head injury °-heart or circulation problems °-if you often drink alcohol °-kidney disease or problems going to the bathroom °-liver disease °-lung disease, asthma, or breathing problems °-an unusual or allergic reaction to acetaminophen, oxycodone, other opioid analgesics, other medicines, foods, dyes, or preservatives °-pregnant or trying to get pregnant °-breast-feeding °How should I use this medicine? °Take this medicine by mouth with a full glass of water. Follow the directions on the prescription label. You can take it with or without food. If it upsets your stomach, take it with food. Take your medicine at regular intervals. Do not take it more often than directed. °Talk to your pediatrician regarding the use of this medicine in children. Special care may be needed. °Patients over 65 years old may have a stronger reaction and need a smaller dose. °Overdosage: If you think you have taken too much of this medicine contact a poison control center or emergency room at once. °NOTE: This medicine is only for you. Do not share this medicine with others. °What if I miss a dose? °If you miss a dose, take it as soon as you can. If it is almost time for your next dose, take only that dose.   Do not take double or extra doses. °What may interact with this medicine? °-alcohol °-antihistamines °-barbiturates like amobarbital, butalbital, butabarbital, methohexital, pentobarbital, phenobarbital, thiopental, and secobarbital °-benztropine °-drugs for bladder problems like solifenacin, trospium, oxybutynin, tolterodine, hyoscyamine, and methscopolamine °-drugs for breathing problems like ipratropium and tiotropium °-drugs for certain stomach or intestine problems like propantheline, homatropine  methylbromide, glycopyrrolate, atropine, belladonna, and dicyclomine °-general anesthetics like etomidate, ketamine, nitrous oxide, propofol, desflurane, enflurane, halothane, isoflurane, and sevoflurane °-medicines for depression, anxiety, or psychotic disturbances °-medicines for sleep °-muscle relaxants °-naltrexone °-narcotic medicines (opiates) for pain °-phenothiazines like perphenazine, thioridazine, chlorpromazine, mesoridazine, fluphenazine, prochlorperazine, promazine, and trifluoperazine °-scopolamine °-tramadol °-trihexyphenidyl °This list may not describe all possible interactions. Give your health care provider a list of all the medicines, herbs, non-prescription drugs, or dietary supplements you use. Also tell them if you smoke, drink alcohol, or use illegal drugs. Some items may interact with your medicine. °What should I watch for while using this medicine? °Tell your doctor or health care professional if your pain does not go away, if it gets worse, or if you have new or a different type of pain. You may develop tolerance to the medicine. Tolerance means that you will need a higher dose of the medication for pain relief. Tolerance is normal and is expected if you take this medicine for a long time. °Do not suddenly stop taking your medicine because you may develop a severe reaction. Your body becomes used to the medicine. This does NOT mean you are addicted. Addiction is a behavior related to getting and using a drug for a non-medical reason. If you have pain, you have a medical reason to take pain medicine. Your doctor will tell you how much medicine to take. If your doctor wants you to stop the medicine, the dose will be slowly lowered over time to avoid any side effects. °You may get drowsy or dizzy. Do not drive, use machinery, or do anything that needs mental alertness until you know how this medicine affects you. Do not stand or sit up quickly, especially if you are an older patient. This  reduces the risk of dizzy or fainting spells. Alcohol may interfere with the effect of this medicine. Avoid alcoholic drinks. °There are different types of narcotic medicines (opiates) for pain. If you take more than one type at the same time, you may have more side effects. Give your health care provider a list of all medicines you use. Your doctor will tell you how much medicine to take. Do not take more medicine than directed. Call emergency for help if you have problems breathing. °The medicine will cause constipation. Try to have a bowel movement at least every 2 to 3 days. If you do not have a bowel movement for 3 days, call your doctor or health care professional. °Do not take Tylenol (acetaminophen) or medicines that have acetaminophen with this medicine. Too much acetaminophen can be very dangerous. Many nonprescription medicines contain acetaminophen. Always read the labels carefully to avoid taking more acetaminophen. °What side effects may I notice from receiving this medicine? °Side effects that you should report to your doctor or health care professional as soon as possible: °-allergic reactions like skin rash, itching or hives, swelling of the face, lips, or tongue °-breathing difficulties, wheezing °-confusion °-light headedness or fainting spells °-severe stomach pain °-unusually weak or tired °-yellowing of the skin or the whites of the eyes °Side effects that usually do not require medical attention (report to your doctor or health   care professional if they continue or are bothersome): °-dizziness °-drowsiness °-nausea °-vomiting °This list may not describe all possible side effects. Call your doctor for medical advice about side effects. You may report side effects to FDA at 1-800-FDA-1088. °Where should I keep my medicine? °Keep out of the reach of children. This medicine can be abused. Keep your medicine in a safe place to protect it from theft. Do not share this medicine with anyone. Selling  or giving away this medicine is dangerous and against the law. °This medicine may cause accidental overdose and death if it taken by other adults, children, or pets. Mix any unused medicine with a substance like cat litter or coffee grounds. Then throw the medicine away in a sealed container like a sealed bag or a coffee can with a lid. Do not use the medicine after the expiration date. °Store at room temperature between 20 and 25 degrees C (68 and 77 degrees F). °NOTE: This sheet is a summary. It may not cover all possible information. If you have questions about this medicine, talk to your doctor, pharmacist, or health care provider. °  °© 2016, Elsevier/Gold Standard. (2013-12-21 15:18:46) ° °Abdominal Hysterectomy, Care After °These instructions give you information on caring for yourself after your procedure. Your doctor may also give you more specific instructions. Call your doctor if you have any problems or questions after your procedure.  °HOME CARE °It takes 4-6 weeks to recover from this surgery. Follow all of your doctor's instructions.  °· Only take medicines as told by your doctor. °· Change your bandage as told by your doctor. °· Return to your doctor to have your stitches taken out. °· Take showers for 2-3 weeks. Ask your doctor when it is okay to shower. °· Do not douche, use tampons, or have sex (intercourse) for at least 6 weeks or as told. °· Follow your doctor's advice about exercise, lifting objects, driving, and general activities. °· Get plenty of rest and sleep. °· Do not lift anything heavier than a gallon of milk (about 10 pounds [4.5 kilograms]) for the first month after surgery. °· Get back to your normal diet as told by your doctor. °· Do not drink alcohol until your doctor says it is okay. °· Take a medicine to help you poop (laxative) as told by your doctor. °· Eating foods high in fiber may help you poop. Eat a lot of raw fruits and vegetables, whole grains, and beans. °· Drink  enough fluids to keep your pee (urine) clear or pale yellow. °· Have someone help you at home for 1-2 weeks after your surgery. °· Keep follow-up doctor visits as told. °GET HELP IF: °· You have chills or fever. °· You have puffiness, redness, or pain in area of the cut (incision). °· You have yellowish-white fluid (pus) coming from the cut. °· You have a bad smell coming from the cut or bandage. °· Your cut pulls apart. °· You feel dizzy or light-headed. °· You have pain or bleeding when you pee. °· You keep having watery poop (diarrhea). °· You keep feeling sick to your stomach (nauseous) or keep throwing up (vomiting). °· You have fluid (discharge) coming from your vagina. °· You have a rash. °· You have a reaction to your medicine. °· You need stronger pain medicine. °GET HELP RIGHT AWAY IF:  °· You have a fever and your symptoms suddenly get worse. °· You have bad belly (abdominal) pain. °· You have chest pain. °· You   are short of breath. °· You pass out (faint). °· You have pain, puffiness, or redness of your leg. °· You bleed a lot from your vagina and notice clumps of tissue (clots). °MAKE SURE YOU:  °· Understand these instructions. °· Will watch your condition. °· Will get help right away if you are not doing well or get worse. °  °This information is not intended to replace advice given to you by your health care provider. Make sure you discuss any questions you have with your health care provider. °  °Document Released: 10/30/2007 Document Revised: 01/25/2013 Document Reviewed: 11/12/2012 °Elsevier Interactive Patient Education ©2016 [employer] Inc. ° °

## 2015-03-27 ENCOUNTER — Telehealth: Payer: Self-pay

## 2015-03-27 NOTE — Telephone Encounter (Signed)
Incoming call , patient of Dr Terrence Dupont Rossi's , post-op day 5 , patent calling with concerns of "bleeding" at one of her incision sites. Patient states she noticed a small amount of at the site. Patient states it is not "dripping" just feels a little "damp" at the site . Updated Melissa cross ,APNP , orders received to have the patient apply a pressure dressing to the site and monitor . Patient was also instructed to call with increased bleeding ,increased pain , redness or nay changes or concerns. The patient agreed with the plan and will call with any changes , questions or concerns.

## 2015-03-28 NOTE — Telephone Encounter (Signed)
Follow up call placed to assess the patient's incisional drainage form yesterday 03/27/2015 . Patient states that the drainage from her incision has "stopped", patient denies further needs at this time.  Patient will call with any additional changes , questions or concerns.

## 2015-03-29 ENCOUNTER — Telehealth: Payer: Self-pay | Admitting: Gynecologic Oncology

## 2015-03-29 NOTE — Telephone Encounter (Signed)
Informed patient of stage IA grade 1 endometrial cancer. However, she has adverse features of +'ve LVSI and positive washings.  She does not meet high/intermediate risk factors for recurrence, and therefore is not a candidate for vaginal brachytherapy or systemic cytotoxic therapy.  However, given these features and the receptor positivity of her lesion, I recommend adjvuant hormonal therapy with progestins x 6 months.  Donaciano Eva, MD

## 2015-04-04 ENCOUNTER — Other Ambulatory Visit: Payer: Self-pay | Admitting: Gynecologic Oncology

## 2015-04-04 ENCOUNTER — Telehealth: Payer: Self-pay

## 2015-04-04 NOTE — Telephone Encounter (Signed)
Orders received to contact the patient to update with Dr Terrence Dupont Rossi's recommendations : "Dermoplast" spray (OTC) for vulva pain . Patient contacted and updated with Dr Everitt Amber recommendations for Dermoplast in addition to sitz bath for vulva pain . Patient states she will pick up the [place] today and pick up the sitz bath with her next appointment on March 06 , 2017 with Dr Denman George . Patient states understanding , denies further questions at this time , will call with any additional changes or concerns.

## 2015-04-09 ENCOUNTER — Ambulatory Visit: Payer: 59 | Attending: Gynecologic Oncology | Admitting: Gynecologic Oncology

## 2015-04-09 ENCOUNTER — Encounter: Payer: Self-pay | Admitting: Gynecologic Oncology

## 2015-04-09 VITALS — BP 154/75 | HR 82 | Temp 98.2°F | Resp 19 | Ht 64.0 in | Wt >= 6400 oz

## 2015-04-09 DIAGNOSIS — Z170 Estrogen receptor positive status [ER+]: Secondary | ICD-10-CM | POA: Diagnosis not present

## 2015-04-09 DIAGNOSIS — E119 Type 2 diabetes mellitus without complications: Secondary | ICD-10-CM | POA: Diagnosis not present

## 2015-04-09 DIAGNOSIS — C541 Malignant neoplasm of endometrium: Secondary | ICD-10-CM | POA: Diagnosis present

## 2015-04-09 DIAGNOSIS — Z90710 Acquired absence of both cervix and uterus: Secondary | ICD-10-CM | POA: Diagnosis not present

## 2015-04-09 DIAGNOSIS — Z6841 Body Mass Index (BMI) 40.0 and over, adult: Secondary | ICD-10-CM | POA: Diagnosis not present

## 2015-04-09 DIAGNOSIS — D649 Anemia, unspecified: Secondary | ICD-10-CM | POA: Insufficient documentation

## 2015-04-09 MED ORDER — MEGESTROL ACETATE 40 MG PO TABS: 80.0000 mg | ORAL_TABLET | Freq: Two times a day (BID) | ORAL | Status: DC

## 2015-04-09 MED ORDER — IBUPROFEN 800 MG PO TABS: 800.0000 mg | ORAL_TABLET | Freq: Three times a day (TID) | ORAL | Status: DC | PRN

## 2015-04-09 NOTE — Progress Notes (Signed)
FOLLOW-UP ENDOMETRIAL CANCER  Assessment:    35 y.o. year old with Stage IA Grade 1 endometrioid endometrial cancer.   S/p robotic assisted total hysterectomy (uterus >250gm) with BSO on 03/22/15. positive LVSI, 20% myometrial invasion, positive pelvic washings grade 1 tumor and lymph nodes not assessed surgically due to severe morbid obesity, but radiographically normal. Her tumor is ER and PR positive.   Plan: 1) Pathology reports reviewed today 2) Treatment counseling - I discussed that her clinical and prognostic picture is mixed. She has some poor prognostic factors (LVSI and positive washings) but other reassuring features (grade 1 tumor and minimal Myo invasion),. She does not meet high/intermediate risk factors and therefore is not a candidate for adjuvant chemotherapy or radiation. However, due to the finding of positive washings, I am recommending a 6 month course of progestins (megace).  I discussed that there is not good evidence for this intervention, however it carries with it low morbidity and toxicity. We discussed the risk for recurrence and symptoms asssociated with recurrence and to notify us of these if they develop sooner. 3) Morbid obesity (BMI 70kg/m2) I discussed that her obesity places her at increased risk for recurrence and death from her cancer, and from other obesity related diseases. We discussed options for weight loss. She is interested in discussing bariatric surgery with surgeons. We will faciliate referral. 4)  Return to clinic in 3 months for routine surveillance.  HPI:  Krista White is a 35 y.o. year old No obstetric history on file. initially seen in consultation on 02/21/15 eferred by Dr Benjie Karvonen for grade 2 endometrial cancer.  She then underwent a robotic assisted total hysterectomy, BSO for a uterus of >250gm on 99991111 without complications.  She acquired perineal lacerations during specimen delivery vaginally. Her postoperative course was uncomplicated.  Her  final pathologic diagnosis is a Stage IA Grade 1 endometrioid endometrial cancer with positive lymphovascular space invasion, 6/31 mm (20%) of myometrial invasion and lymph nodes were grossly negative on preoperative imaging (CT abdo/pelvis). She had positive washings on her pathology. ER/PR was positive in her primary tumor.  She is 35 y.o. year old No obstetric and to discuss her pathology results and treatment plan.  Since discharge from the hospital, she is feeling overall fairly well. She has had some discomfort on the perineum at the site of her lacerations.  She has improving appetite, normal bowel and bladder function, and pain controlled with minimal PO medication. She has no other complaints today.  Past Medical History  Diagnosis Date  . Anemia   . Obesity   . Diabetes mellitus without complication ([place])     pt states she was pre diabetic but is no longer. Takes no meds and does not check her blood sugar.   . Nocturia   . Cancer ([place])     endometerial cancer  . Tingling     right hand   Past Surgical History  Procedure Laterality Date  . Hysteroscopy w/d&c N/A 01/30/2015    Procedure: DILATATION AND CURETTAGE /HYSTEROSCOPY;  Surgeon: Azucena Fallen, MD;  Location: [facility];  Service: Gynecology;  Laterality: N/A;  . Tympanostomy tube placement      bilat   . Robotic assisted total hysterectomy with bilateral salpingo oopherectomy N/A 03/22/2015    Procedure: XI ROBOTIC ASSISTED TOTAL ABDOMINAL HYSTERECTOMY FOR UTERUS GREATER THAN 250 GMS WITH BILATERAL SALPINGO OOPHORECTOMY;  Surgeon: Everitt Amber, MD;  Location: [facility];  Service: Gynecology;  Laterality: N/A;   Family History  Problem  Relation Age of Onset  . Cancer Mother   . Diabetes Father    Social History   Social History  . Marital Status: Single    Spouse Name: N/A  . Number of Children: N/A  . Years of Education: N/A   Occupational History  . Not on file.   Social History Main Topics  . Smoking status:  Never Smoker   . Smokeless tobacco: Never Used  . Alcohol Use: No  . Drug Use: No  . Sexual Activity: Not on file   Other Topics Concern  . Not on file   Social History Narrative     Review of systems: Constitutional:  She has no weight gain or weight loss. She has no fever or chills. Eyes: No blurred vision Ears, Nose, Mouth, Throat: No dizziness, headaches or changes in hearing. No mouth sores. Cardiovascular: No chest pain, palpitations or edema. Respiratory:  No shortness of breath, wheezing or cough Gastrointestinal: She has normal bowel movements without diarrhea or constipation. She denies any nausea or vomiting. She denies blood in her stool or heart burn. Genitourinary:  She denies pelvic pain, pelvic pressure or changes in her urinary function. She has no hematuria, dysuria, or incontinence. She has no irregular vaginal bleeding or vaginal discharge Musculoskeletal: Denies muscle weakness or joint pains.  Skin:  She has no skin changes, rashes or itching Neurological:  Denies dizziness or headaches. No neuropathy, no numbness or tingling. Psychiatric:  She denies depression or anxiety. Hematologic/Lymphatic:   No easy bruising or bleeding   Physical Exam: Blood pressure 154/75, pulse 82, temperature 98.2 F (36.8 C), temperature source Oral, resp. rate 19, height 5\' 4"  (1.626 m), weight 402 lb 9.6 oz (182.618 kg), last menstrual period 03/15/2015, SpO2 100 %. General: Well dressed, well nourished in no apparent distress.   HEENT:  Normocephalic and atraumatic, no lesions.  Extraocular muscles intact. Sclerae anicteric. Pupils equal, round, reactive. No mouth sores or ulcers. Thyroid is normal size, not nodular, midline. Abdomen:  Soft, nontender, nondistended.  No palpable masses.  No hepatosplenomegaly.  No ascites. Normal bowel sounds.  No hernias.  Incisions are well healed. Genitourinary: Normal EGBUS  Vaginal cuff intact.  No bleeding or discharge.  No cul de sac  fullness. Perineal and vulvar incisions healing well. Extremities: No cyanosis, clubbing or edema.  No calf tenderness or erythema. No palpable cords. Psychiatric: Mood and affect are appropriate. Neurological: Awake, alert and oriented x 3. Sensation is intact, no neuropathy.  Musculoskeletal: No pain, normal strength and range of motion.   30 minutes of direct face to face counseling time was spent with the patient. This included discussion about prognosis, therapy recommendations and postoperative side effects and are beyond the scope of routine postoperative care.  Donaciano Eva, MD

## 2015-04-09 NOTE — Patient Instructions (Addendum)
Plan to follow up in three months or sooner if needed.  Please call for any questions or concerns.    Please call [phone] to sign up to attend the Bariatric Information Seminar in order to get the process going to meet surgeons, nutritionists, for weight loss surgery

## 2015-04-23 ENCOUNTER — Encounter: Payer: Self-pay | Admitting: Gynecologic Oncology

## 2015-04-23 NOTE — Progress Notes (Signed)
Gynecologic Oncology Multi-Disciplinary Disposition Conference Note  Date of the Conference: April 23, 2015  Patient Name: Krista White  Referring Provider: Dr. Benjie Karvonen Primary GYN Oncologist: Dr. Everitt Amber  Stage/Disposition:  Stage IA Grade 1 endometrioid endometrial cancer.  Disposition is to progestin therapy for six months.  Not a candidate for chemotherapy or radiation due to low risk features on pathology.   This Multidisciplinary conference took place involving physicians from [place], [place], Radiation Oncology, Pathology, Radiology along with the Gynecologic Oncology Nurse Practitioner and RN.  Comprehensive assessment of the patient's malignancy, staging, need for surgery, chemotherapy, radiation therapy, and need for further testing were reviewed. Supportive measures, both inpatient and following discharge were also discussed. The recommended plan of care is documented. Greater than 35 minutes were spent correlating and coordinating this patient's care.  Information above reviewed and agreed upon by Dr. Everitt Amber.

## 2015-05-07 ENCOUNTER — Telehealth: Payer: Self-pay

## 2015-05-07 NOTE — Telephone Encounter (Signed)
Patient contacted to updated with Dr Terrence Dupont Rossi's denial for "Ambien" , Dr Denman George states that the risks out way the benefit . The patient is obese has sleep apnea , Dr Denman George recommends that the patient follow up with her PCP . Attempted to contact the patient with denial for Ambien, no answer , left a detailed message with call back information if additional questions arise.

## 2015-07-16 ENCOUNTER — Ambulatory Visit: Payer: Self-pay | Admitting: Gynecologic Oncology

## 2015-08-13 ENCOUNTER — Ambulatory Visit: Payer: 59 | Attending: Gynecologic Oncology | Admitting: Gynecologic Oncology

## 2015-08-13 ENCOUNTER — Encounter: Payer: Self-pay | Admitting: Gynecologic Oncology

## 2015-08-13 VITALS — BP 141/69 | HR 98 | Temp 98.6°F | Resp 18 | Ht 64.0 in | Wt >= 6400 oz

## 2015-08-13 DIAGNOSIS — Z90710 Acquired absence of both cervix and uterus: Secondary | ICD-10-CM | POA: Diagnosis not present

## 2015-08-13 DIAGNOSIS — R202 Paresthesia of skin: Secondary | ICD-10-CM | POA: Diagnosis not present

## 2015-08-13 DIAGNOSIS — C541 Malignant neoplasm of endometrium: Secondary | ICD-10-CM | POA: Insufficient documentation

## 2015-08-13 DIAGNOSIS — Z809 Family history of malignant neoplasm, unspecified: Secondary | ICD-10-CM | POA: Diagnosis not present

## 2015-08-13 DIAGNOSIS — D649 Anemia, unspecified: Secondary | ICD-10-CM | POA: Insufficient documentation

## 2015-08-13 DIAGNOSIS — Z6841 Body Mass Index (BMI) 40.0 and over, adult: Secondary | ICD-10-CM | POA: Insufficient documentation

## 2015-08-13 DIAGNOSIS — Z90722 Acquired absence of ovaries, bilateral: Secondary | ICD-10-CM | POA: Insufficient documentation

## 2015-08-13 DIAGNOSIS — E119 Type 2 diabetes mellitus without complications: Secondary | ICD-10-CM | POA: Diagnosis not present

## 2015-08-13 DIAGNOSIS — R351 Nocturia: Secondary | ICD-10-CM | POA: Insufficient documentation

## 2015-08-13 DIAGNOSIS — Z833 Family history of diabetes mellitus: Secondary | ICD-10-CM | POA: Diagnosis not present

## 2015-08-13 NOTE — Progress Notes (Signed)
FOLLOW-UP ENDOMETRIAL CANCER  Assessment:    35 y.o. year old with Stage IA Grade 1 endometrioid endometrial cancer.   S/p robotic assisted total hysterectomy (uterus >250gm) with BSO on 03/22/15. positive LVSI, 20% myometrial invasion, positive pelvic washings grade 1 tumor and lymph nodes not assessed surgically due to severe morbid obesity, but radiographically normal. Her tumor is ER and PR positive.  Morbid obesity with continued weight gain   Plan: 1) Endometrial cancer: I discussed that her clinical and prognostic picture is mixed. She has some poor prognostic factors (LVSI and positive washings) but other reassuring features (grade 1 tumor and minimal Myo invasion),. She does not meet high/intermediate risk factors and therefore is not a candidate for adjuvant chemotherapy or radiation. However, due to the finding of positive washings, I am recommending a 6 month course of progestins (megace). She will continue this until September, 2017. I discussed that there is not good evidence for this intervention, however it carries with it low morbidity and toxicity. We discussed the risk for recurrence and symptoms asssociated with recurrence and to notify us of these if they develop sooner. 2) Morbid obesity (BMI 72kg/m2, up from 70kg/m2) I discussed that her obesity places her at increased risk for recurrence and death from her cancer, and from other obesity related diseases. We discussed options for weight loss. She is interested in discussing bariatric surgery with surgeons. We will faciliate referral. Of note, we provided her with this exact information in March, 2017 however she did not make the necessary appointments. 3)  Return to clinic in 3 months for routine surveillance.  HPI:  Krista White is a 35 y.o. year old No obstetric history on file. initially seen in consultation on 02/21/15 eferred by Dr Benjie Karvonen for grade 2 endometrial cancer.  She then underwent a robotic assisted total  hysterectomy, BSO for a uterus of >250gm on 99991111 without complications.  She acquired perineal lacerations during specimen delivery vaginally. Her postoperative course was uncomplicated.  Her final pathologic diagnosis is a Stage IA Grade 1 endometrioid endometrial cancer with positive lymphovascular space invasion, 6/31 mm (20%) of myometrial invasion and lymph nodes were grossly negative on preoperative imaging (CT abdo/pelvis). She had positive washings on her pathology. ER/PR was positive in her primary tumor.  Interval Hx: She is seen today for a routine surveillance check. She denies new symptoms of recurrence. She denies pain. She denies new health concerns. No vaginal bleeding. She has gained 14 pounds from her last visit in March (405 to 419lbs). She has no other complaints today.  Past Medical History  Diagnosis Date  . Anemia   . Obesity   . Diabetes mellitus without complication ([place])     pt states she was pre diabetic but is no longer. Takes no meds and does not check her blood sugar.   . Nocturia   . Cancer ([place])     endometerial cancer  . Tingling     right hand   Past Surgical History  Procedure Laterality Date  . Hysteroscopy w/d&c N/A 01/30/2015    Procedure: DILATATION AND CURETTAGE /HYSTEROSCOPY;  Surgeon: Azucena Fallen, MD;  Location: [facility];  Service: Gynecology;  Laterality: N/A;  . Tympanostomy tube placement      bilat   . Robotic assisted total hysterectomy with bilateral salpingo oopherectomy N/A 03/22/2015    Procedure: XI ROBOTIC ASSISTED TOTAL ABDOMINAL HYSTERECTOMY FOR UTERUS GREATER THAN 250 GMS WITH BILATERAL SALPINGO OOPHORECTOMY;  Surgeon: Everitt Amber, MD;  Location: [facility];  Service: Gynecology;  Laterality: N/A;   Family History  Problem Relation Age of Onset  . Cancer Mother   . Diabetes Father    Social History   Social History  . Marital Status: Single    Spouse Name: N/A  . Number of Children: N/A  . Years of Education: N/A    Occupational History  . Not on file.   Social History Main Topics  . Smoking status: Never Smoker   . Smokeless tobacco: Never Used  . Alcohol Use: No  . Drug Use: No  . Sexual Activity: Not on file   Other Topics Concern  . Not on file   Social History Narrative     Review of systems: Constitutional:  She has no weight gain or weight loss. She has no fever or chills. Eyes: No blurred vision Ears, Nose, Mouth, Throat: No dizziness, headaches or changes in hearing. No mouth sores. Cardiovascular: No chest pain, palpitations or edema. Respiratory:  No shortness of breath, wheezing or cough Gastrointestinal: She has normal bowel movements without diarrhea or constipation. She denies any nausea or vomiting. She denies blood in her stool or heart burn. Genitourinary:  She denies pelvic pain, pelvic pressure or changes in her urinary function. She has no hematuria, dysuria, or incontinence. She has no irregular vaginal bleeding or vaginal discharge Musculoskeletal: Denies muscle weakness or joint pains.  Skin:  She has no skin changes, rashes or itching Neurological:  Denies dizziness or headaches. No neuropathy, no numbness or tingling. Psychiatric:  She denies depression or anxiety. Hematologic/Lymphatic:   No easy bruising or bleeding   Physical Exam: Blood pressure 141/69, pulse 98, temperature 98.6 F (37 C), temperature source Oral, resp. rate 18, height 5\' 4"  (1.626 m), weight 419 lb 9.6 oz (190.329 kg), SpO2 99 %. General: Well dressed, well nourished in no apparent distress.   HEENT:  Normocephalic and atraumatic, no lesions.  Extraocular muscles intact. Sclerae anicteric. Pupils equal, round, reactive. No mouth sores or ulcers. Thyroid is normal size, not nodular, midline. Abdomen:  Soft, nontender, nondistended.  No palpable masses.  No hepatosplenomegaly.  No ascites. Normal bowel sounds.  No hernias.  Incisions are well healed. Genitourinary: Normal EGBUS  Vaginal  cuff intact.  No bleeding or discharge.  No cul de sac fullness. Exam was limited due to body habitus. Extremities: No cyanosis, clubbing or edema.  No calf tenderness or erythema. No palpable cords. Psychiatric: Mood and affect are appropriate. Neurological: Awake, alert and oriented x 3. Sensation is intact, no neuropathy.  Musculoskeletal: No pain, normal strength and range of motion.   30 minutes of direct face to face counseling time was spent with the patient. This included discussion about prognosis, therapy recommendations and postoperative side effects and are beyond the scope of routine postoperative care.  Donaciano Eva, MD

## 2015-08-13 NOTE — Patient Instructions (Signed)
You can call [phone] to sign up for an information session about bariatric surgery.  From there, they can assist with getting set up with the surgeons.  Plan to follow up with Dr. Denman George in September or sooner if needed.  Please call our office for any questions, concerns, or new vaginal bleeding.

## 2015-10-24 ENCOUNTER — Encounter: Payer: Self-pay | Admitting: Gynecologic Oncology

## 2015-10-24 ENCOUNTER — Encounter: Payer: Self-pay | Admitting: Gastroenterology

## 2015-10-24 ENCOUNTER — Ambulatory Visit: Payer: 59 | Attending: Gynecologic Oncology | Admitting: Gynecologic Oncology

## 2015-10-24 VITALS — BP 129/70 | HR 82 | Temp 99.3°F | Resp 20 | Wt >= 6400 oz

## 2015-10-24 DIAGNOSIS — Z08 Encounter for follow-up examination after completed treatment for malignant neoplasm: Secondary | ICD-10-CM | POA: Diagnosis present

## 2015-10-24 DIAGNOSIS — Z8542 Personal history of malignant neoplasm of other parts of uterus: Secondary | ICD-10-CM | POA: Insufficient documentation

## 2015-10-24 DIAGNOSIS — Z170 Estrogen receptor positive status [ER+]: Secondary | ICD-10-CM

## 2015-10-24 DIAGNOSIS — R109 Unspecified abdominal pain: Secondary | ICD-10-CM | POA: Diagnosis not present

## 2015-10-24 DIAGNOSIS — Z6841 Body Mass Index (BMI) 40.0 and over, adult: Secondary | ICD-10-CM | POA: Diagnosis not present

## 2015-10-24 DIAGNOSIS — R1012 Left upper quadrant pain: Secondary | ICD-10-CM

## 2015-10-24 DIAGNOSIS — C541 Malignant neoplasm of endometrium: Secondary | ICD-10-CM | POA: Diagnosis not present

## 2015-10-24 DIAGNOSIS — E119 Type 2 diabetes mellitus without complications: Secondary | ICD-10-CM | POA: Diagnosis not present

## 2015-10-24 DIAGNOSIS — Z90710 Acquired absence of both cervix and uterus: Secondary | ICD-10-CM

## 2015-10-24 DIAGNOSIS — K625 Hemorrhage of anus and rectum: Secondary | ICD-10-CM | POA: Diagnosis not present

## 2015-10-24 NOTE — Progress Notes (Signed)
FOLLOW-UP ENDOMETRIAL CANCER  Assessment:    35 y.o. year old with Stage IA Grade 1 endometrioid endometrial cancer.   S/p robotic assisted total hysterectomy (uterus >250gm) with BSO on 03/22/15. positive LVSI, 20% myometrial invasion, positive pelvic washings grade 1 tumor and lymph nodes not assessed surgically due to severe morbid obesity, but radiographically normal. Her tumor is ER and PR positive.  Morbid obesity with continued weight gain  No evidence of recurrence on exam.  New rectal bleeding without hemorrhoids (external) on exam.  New abdominal pain - unexplained.  Plan: 1) Endometrial cancer: She had some poor prognostic factors (LVSI and positive washings) but other reassuring features (grade 1 tumor and minimal Myo invasion),. She did not meet high/intermediate risk factors and therefore was not a candidate for adjuvant chemotherapy or radiation. However, due to the finding of positive washings, she was treated with a 6 month course of progestins (megace), until September, 2017. She is NED for recurrence on today's exam. We discussed the risk for recurrence and symptoms asssociated with recurrence and to notify us of these if they develop sooner. 2) Rectal bleeding: GI consult. Discussed with Marithza that endometrial  Cancer history increases her risk for colon cancer. 3) abdominal pain: unexplained on exam. May be adhesive disease. Recommend CT imaging to rule out recurrence (she had peritoneal washings positive for endometrial cancer). 4) Morbid obesity (BMI 72kg/m2, up from 70kg/m2) I discussed that her obesity places her at increased risk for recurrence and death from her cancer, and from other obesity related diseases. We discussed options for weight loss. She is interested in discussing bariatric surgery with surgeons. We have facilitated referral. Of note, we provided her with this information in March, 2017 however she did not make the appointments at that time. 5)  Return to  clinic in 3 months for routine surveillance.  HPI:  Krista White is a 35 y.o. year old G74 initially seen in consultation on 02/21/15 eferred by Dr Benjie Karvonen for grade 2 endometrial cancer.  She has morbid obesity with a BMI of 72kg/m2. She then underwent a robotic assisted total hysterectomy, BSO for a uterus of >250gm on 99991111 without complications.  She acquired perineal lacerations during specimen delivery vaginally. Her postoperative course was uncomplicated.  Her final pathologic diagnosis is a Stage IA Grade 1 endometrioid endometrial cancer with positive lymphovascular space invasion, 6/31 mm (20%) of myometrial invasion and lymph nodes were grossly negative on preoperative imaging (CT abdo/pelvis). She had positive washings on her pathology. ER/PR was positive in her primary tumor.  Interval Hx: She is seen today for a routine surveillance check. She denies new symptoms of recurrence. She denies pain. She denies new health concerns. No vaginal bleeding. She stopped taking the megace in June, 2016. She has gained 10pounds from her last visit 3 months ago in June (419lbs to 429lbs). She reports generalized upper left abdominal pains, sharp, brief, not associated with anything. Noticed their onset "a little time after the surgery". Not more frequent or more severe, but persistent.  She had a single episode of rectal bleeding 1 week ago - pink on toilet tissue with mucous.   Past Medical History:  Diagnosis Date  . Anemia   . Cancer ([place])    endometerial cancer  . Diabetes mellitus without complication ([place])    pt states she was pre diabetic but is no longer. Takes no meds and does not check her blood sugar.   . Nocturia   . Obesity   .  Tingling    right hand   Past Surgical History:  Procedure Laterality Date  . HYSTEROSCOPY W/D&C N/A 01/30/2015   Procedure: DILATATION AND CURETTAGE /HYSTEROSCOPY;  Surgeon: Azucena Fallen, MD;  Location: [facility];  Service: Gynecology;  Laterality: N/A;  .  ROBOTIC ASSISTED TOTAL HYSTERECTOMY WITH BILATERAL SALPINGO OOPHERECTOMY N/A 03/22/2015   Procedure: XI ROBOTIC ASSISTED TOTAL ABDOMINAL HYSTERECTOMY FOR UTERUS GREATER THAN 250 [place] WITH BILATERAL SALPINGO OOPHORECTOMY;  Surgeon: Everitt Amber, MD;  Location: [facility];  Service: Gynecology;  Laterality: N/A;  . TYMPANOSTOMY TUBE PLACEMENT     bilat    Family History  Problem Relation Age of Onset  . Cancer Mother   . Diabetes Father    Social History   Social History  . Marital status: Single    Spouse name: N/A  . Number of children: N/A  . Years of education: N/A   Occupational History  . Not on file.   Social History Main Topics  . Smoking status: Never Smoker  . Smokeless tobacco: Never Used  . Alcohol use No  . Drug use: No  . Sexual activity: Not on file   Other Topics Concern  . Not on file   Social History Narrative  . No narrative on file     Review of systems: Constitutional:  She has no weight gain or weight loss. She has no fever or chills. Eyes: No blurred vision Ears, Nose, Mouth, Throat: No dizziness, headaches or changes in hearing. No mouth sores. Cardiovascular: No chest pain, palpitations or edema. Respiratory:  No shortness of breath, wheezing or cough Gastrointestinal: She has normal bowel movements without diarrhea or constipation. She denies any nausea or vomiting. She denies blood in her stool or heart burn. Abdominal pain. Genitourinary:  + rectal bleeding (low volume) Musculoskeletal: Denies muscle weakness or joint pains.  Skin:  She has no skin changes, rashes or itching Neurological:  Denies dizziness or headaches. No neuropathy, no numbness or tingling. Psychiatric:  She denies depression or anxiety. Hematologic/Lymphatic:   No easy bruising or bleeding   Physical Exam: Blood pressure 129/70, pulse 82, temperature 99.3 F (37.4 C), temperature source Oral, resp. rate 20, weight (!) 429 lb 4.8 oz (194.7 kg), SpO2 99 %. General: Well  dressed, well nourished in no apparent distress.   HEENT:  Normocephalic and atraumatic, no lesions.  Extraocular muscles intact. Sclerae anicteric. Pupils equal, round, reactive. No mouth sores or ulcers. Thyroid is normal size, not nodular, midline. Abdomen:  Soft, nontender, nondistended.  No palpable masses.  No hepatosplenomegaly.  No ascites. Normal bowel sounds.  No hernias.  Incisions are well healed. Genitourinary: Normal EGBUS  Vaginal cuff intact.  No bleeding or discharge.  No cul de sac fullness. Exam was limited due to body habitus. Extremities: No cyanosis, clubbing or edema.  No calf tenderness or erythema. No palpable cords. Psychiatric: Mood and affect are appropriate. Neurological: Awake, alert and oriented x 3. Sensation is intact, no neuropathy.  Musculoskeletal: No pain, normal strength and range of motion.  Donaciano Eva, MD

## 2015-10-24 NOTE — Patient Instructions (Signed)
Plan to have a CT scan of the abdomen and pelvis.  We will also arrange for you to meet with the gastroenterologist again for evaluation of rectal bleeding.  Plan to follow up in three months time or sooner if needed.  Please call for any needs or concerns.

## 2015-10-30 ENCOUNTER — Ambulatory Visit (HOSPITAL_COMMUNITY)
Admission: RE | Admit: 2015-10-30 | Discharge: 2015-10-30 | Disposition: A | Discharge status: Home / Self Care | Payer: 59 | Source: Ambulatory Visit | Attending: Gynecologic Oncology | Admitting: Gynecologic Oncology

## 2015-10-30 ENCOUNTER — Encounter (HOSPITAL_COMMUNITY): Payer: Self-pay | Admitting: Radiology

## 2015-10-30 DIAGNOSIS — Z9079 Acquired absence of other genital organ(s): Secondary | ICD-10-CM | POA: Insufficient documentation

## 2015-10-30 DIAGNOSIS — R1012 Left upper quadrant pain: Secondary | ICD-10-CM | POA: Diagnosis not present

## 2015-10-30 DIAGNOSIS — R109 Unspecified abdominal pain: Secondary | ICD-10-CM | POA: Diagnosis present

## 2015-10-30 DIAGNOSIS — C541 Malignant neoplasm of endometrium: Secondary | ICD-10-CM

## 2015-10-30 MED ORDER — IOPAMIDOL (ISOVUE-300) INJECTION 61%
100.0000 mL | Freq: Once | INTRAVENOUS | Status: AC | PRN
  Administered 2015-10-30: 100 mL via INTRAVENOUS

## 2016-01-01 ENCOUNTER — Encounter: Payer: Self-pay | Admitting: Gastroenterology

## 2016-01-01 ENCOUNTER — Encounter (INDEPENDENT_AMBULATORY_CARE_PROVIDER_SITE_OTHER): Payer: Self-pay

## 2016-01-01 ENCOUNTER — Ambulatory Visit (INDEPENDENT_AMBULATORY_CARE_PROVIDER_SITE_OTHER): Payer: 59 | Admitting: Gastroenterology

## 2016-01-01 VITALS — BP 130/78 | HR 88 | Ht 63.75 in | Wt >= 6400 oz

## 2016-01-01 DIAGNOSIS — R1031 Right lower quadrant pain: Secondary | ICD-10-CM | POA: Diagnosis not present

## 2016-01-01 DIAGNOSIS — K625 Hemorrhage of anus and rectum: Secondary | ICD-10-CM

## 2016-01-01 MED ORDER — NA SULFATE-K SULFATE-MG SULF 17.5-3.13-1.6 GM/177ML PO SOLN: 1.0000 | Freq: Once | ORAL | 0 refills | Status: AC

## 2016-01-01 NOTE — Patient Instructions (Signed)
You will be set up for a colonoscopy for rectal bleeding. This will be done at [facility] with MAC sedation.

## 2016-01-01 NOTE — Progress Notes (Signed)
HPI: This is a  very pleasant 35 year old woman   who was referred to me by Everitt Amber, MD  to evaluate  intermittent rectal bleeding .    Chief complaint is minor rectal bleeding, right lower quadrant intermittent pain  Endometrial cancer treated with hysterectomy; originally diagnosed after abnormal menstral bleeding. No adjuvant chemo or xrt.  Mother had ovarian cancer  No colon cancer in her family.  Minor rectal bleeding on TP; small spot of blood.  She does not have constipation.  2-3 bms daily.  Dietary dependant.  Certain foods even water can cause pains.  Sharp pains. Also RLQ pains, crampy pain that last 10 seconds.  CT abdomen and pelvis with IV and oral contrast  recently was normal.  Review of systems: Pertinent positive and negative review of systems were noted in the above HPI section. Complete review of systems was performed and was otherwise normal.   Past Medical History:  Diagnosis Date  . Anemia   . Diabetes mellitus without complication ([place])    pt states she was pre diabetic but is no longer. Takes no meds and does not check her blood sugar.   . Endometrial cancer ([place])   . IBS (irritable bowel syndrome)   . Nocturia   . Obesity   . Tingling    right hand    Past Surgical History:  Procedure Laterality Date  . HYSTEROSCOPY W/D&C N/A 01/30/2015   Procedure: DILATATION AND CURETTAGE /HYSTEROSCOPY;  Surgeon: Azucena Fallen, MD;  Location: [facility];  Service: Gynecology;  Laterality: N/A;  . ROBOTIC ASSISTED TOTAL HYSTERECTOMY WITH BILATERAL SALPINGO OOPHERECTOMY N/A 03/22/2015   Procedure: XI ROBOTIC ASSISTED TOTAL ABDOMINAL HYSTERECTOMY FOR UTERUS GREATER THAN [address] [place] WITH BILATERAL SALPINGO OOPHORECTOMY;  Surgeon: Everitt Amber, MD;  Location: [facility];  Service: Gynecology;  Laterality: N/A;  . TYMPANOSTOMY TUBE PLACEMENT     bilat     No current outpatient prescriptions on file.   No current facility-administered medications for this visit.      Allergies as of 01/01/2016 - Review Complete 01/01/2016  Allergen Reaction Noted  . Asa [aspirin] Hives 01/30/2015  . Latex Itching 02/09/2012  . Pineapple  05/27/2014  . Lactase Diarrhea 05/27/2014    Family History  Problem Relation Age of Onset  . Ovarian cancer Mother   . Diabetes Father   . Throat cancer Paternal Uncle   . Diabetes Paternal Aunt   . Diabetes Maternal Uncle     Social History   Social History  . Marital status: Single    Spouse name: N/A  . Number of children: 0  . 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 Smoking status: Never Smoker  . Smokeless tobacco: Never Used  . Alcohol use No  . Drug use: No  . Sexual activity: Not on file   Other Topics Concern  . Not on file   Social History Narrative  . No narrative on file     Physical Exam: Ht 5' 3.75" (1.619 m) Comment: height measured without shoes  Wt (!) 429 lb 6 oz (194.8 kg)   LMP 03/15/2015   BMI 74.28 kg/m  Constitutional: generally well-appearing Psychiatric: alert and oriented x3 Eyes: extraocular movements intact Mouth: oral pharynx moist, no lesions Neck: supple no lymphadenopathy Cardiovascular: heart regular rate and rhythm Lungs: clear to auscultation bilaterally Abdomen: soft, nontender, nondistended, no obvious ascites, no peritoneal signs, normal bowel sounds Extremities: no  lower extremity edema bilaterally Skin: no lesions on visible extremities   Assessment and plan: 35 y.o. female with  Massive obesity (BMI 74), minor rectal bleeding, intermittent brief right lower quadrant pains  Will proceed with colonoscopy at her soonest convenience. I explained that probably this is minor anorectal benign disease but we need to exclude neoplastic causes. She has a BMI of 74 and is above our weight limit for outpatient endoscopy center and so this will be performed at [facility] long hospital with anesthesia  assistance. Her very brief spasm-like pains in her right lower quadrant are of unclear etiology. They last about 10 seconds that is generally not associated with GI issues. She's had recent CT scan that was normal for her abdomen and pelvis. I don't think it is showing demands further workup if her colonoscopy is normal.   Owens Loffler, MD [facility] 01/01/2016, 3:10 PM  Cc: Everitt Amber, MD

## 2016-01-09 ENCOUNTER — Ambulatory Visit: Payer: 59 | Attending: Gynecologic Oncology | Admitting: Gynecologic Oncology

## 2016-01-09 ENCOUNTER — Encounter: Payer: Self-pay | Admitting: Gynecologic Oncology

## 2016-01-09 VITALS — BP 135/88 | HR 70 | Temp 98.3°F | Resp 18 | Ht 63.75 in | Wt >= 6400 oz

## 2016-01-09 DIAGNOSIS — Z9889 Other specified postprocedural states: Secondary | ICD-10-CM | POA: Insufficient documentation

## 2016-01-09 DIAGNOSIS — Z8542 Personal history of malignant neoplasm of other parts of uterus: Secondary | ICD-10-CM | POA: Diagnosis not present

## 2016-01-09 DIAGNOSIS — E119 Type 2 diabetes mellitus without complications: Secondary | ICD-10-CM | POA: Diagnosis not present

## 2016-01-09 DIAGNOSIS — Z6841 Body Mass Index (BMI) 40.0 and over, adult: Secondary | ICD-10-CM

## 2016-01-09 DIAGNOSIS — R109 Unspecified abdominal pain: Secondary | ICD-10-CM | POA: Diagnosis not present

## 2016-01-09 DIAGNOSIS — K625 Hemorrhage of anus and rectum: Secondary | ICD-10-CM | POA: Diagnosis not present

## 2016-01-09 DIAGNOSIS — C541 Malignant neoplasm of endometrium: Secondary | ICD-10-CM | POA: Insufficient documentation

## 2016-01-09 DIAGNOSIS — Z8041 Family history of malignant neoplasm of ovary: Secondary | ICD-10-CM | POA: Insufficient documentation

## 2016-01-09 DIAGNOSIS — Z833 Family history of diabetes mellitus: Secondary | ICD-10-CM | POA: Insufficient documentation

## 2016-01-09 DIAGNOSIS — Z800 Family history of malignant neoplasm of digestive organs: Secondary | ICD-10-CM | POA: Insufficient documentation

## 2016-01-09 DIAGNOSIS — K589 Irritable bowel syndrome without diarrhea: Secondary | ICD-10-CM | POA: Insufficient documentation

## 2016-01-09 DIAGNOSIS — Z90710 Acquired absence of both cervix and uterus: Secondary | ICD-10-CM | POA: Diagnosis not present

## 2016-01-09 NOTE — Patient Instructions (Signed)
You should follow-up with Dr Denman George in 4 months (April, 2018).  Please notify our office if you notice bleeding, abnormal discharge or new abdominal pains.

## 2016-01-09 NOTE — Progress Notes (Signed)
FOLLOW-UP ENDOMETRIAL CANCER  Assessment:    35 y.o. year old with Stage IA Grade 1 endometrioid endometrial cancer.   S/p robotic assisted total hysterectomy (uterus >250gm) with BSO on 03/22/15. positive LVSI, 20% myometrial invasion, positive pelvic washings grade 1 tumor and lymph nodes not assessed surgically due to severe morbid obesity, but radiographically normal. Her tumor is ER and PR positive.  Morbid obesity with continued weight gain  No evidence of recurrence on exam.  New rectal bleeding without hemorrhoids (external) on exam.   Plan: 1) Endometrial cancer: She had some poor prognostic factors (LVSI and positive washings) but other reassuring features (grade 1 tumor and minimal Myo invasion),. She did not meet high/intermediate risk factors and therefore was not a candidate for adjuvant chemotherapy or radiation. However, due to the finding of positive washings, she was treated with a 6 month course of progestins (megace), until September, 2017. She is NED for recurrence on today's exam. We discussed the risk for recurrence and symptoms asssociated with recurrence and to notify us of these if they develop sooner. 2) Rectal bleeding: s/p GI consult - colonoscopy scheduled for January, 2018 3) abdominal pain: unexplained on exam or CT scan from September, 2017. May be adhesive disease.  4) Morbid obesity (BMI 72kg/m2, up from 70kg/m2) I discussed that her obesity places her at increased risk for recurrence and death from her cancer, and from other obesity related diseases. We discussed options for weight loss. She is interested in discussing bariatric surgery with surgeons. We have facilitated referral. Of note, we provided her with this information in March, 2017 however she did not make the appointments at that time. 5)  Return to clinic in 3 months for routine surveillance.  HPI:  Krista White is a 35 y.o. year old G77 initially seen in consultation on 02/21/15 eferred by Dr Benjie Karvonen  for grade 2 endometrial cancer.  She has morbid obesity with a BMI of 72kg/m2. She then underwent a robotic assisted total hysterectomy, BSO for a uterus of >250gm on [id number] without complications.  She acquired perineal lacerations during specimen delivery vaginally. Her postoperative course was uncomplicated.  Her final pathologic diagnosis is a Stage IA Grade 1 endometrioid endometrial cancer with positive lymphovascular space invasion, 6/31 mm (20%) of myometrial invasion and lymph nodes were grossly negative on preoperative imaging (CT abdo/pelvis). She had positive washings on her pathology. ER/PR was positive in her primary tumor.  In September 2017 she reported new abdominal pains. CT abdo/pelvis was unremarkable.  Interval Hx: She is seen today for a routine surveillance check. She denies new symptoms of recurrence. She denies pain. She denies new health concerns. No vaginal bleeding. She stopped taking the megace in June, 2016. She has gained 10pounds from her last visit 3 months ago in June (419lbs to 429lbs). She had a single episode of rectal bleeding and has colonoscopy scheduled for January, 2018.  Past Medical History:  Diagnosis Date  . Anemia   . Diabetes mellitus without complication ([place])    pt states she was pre diabetic but is no longer. Takes no meds and does not check her blood sugar.   . Endometrial cancer ([place])   . IBS (irritable bowel syndrome)   . Nocturia   . Obesity   . Tingling    right hand   Past Surgical History:  Procedure Laterality Date  . HYSTEROSCOPY W/D&C N/A 01/30/2015   Procedure: DILATATION AND CURETTAGE /HYSTEROSCOPY;  Surgeon: Azucena Fallen, MD;  Location: [facility];  Service: Gynecology;  Laterality: N/A;  . ROBOTIC ASSISTED TOTAL HYSTERECTOMY WITH BILATERAL SALPINGO OOPHERECTOMY N/A 03/22/2015   Procedure: XI ROBOTIC ASSISTED TOTAL ABDOMINAL HYSTERECTOMY FOR UTERUS GREATER THAN [address] [place] WITH BILATERAL SALPINGO OOPHORECTOMY;  Surgeon: Everitt Amber, MD;   Location: [facility];  Service: Gynecology;  Laterality: N/A;  . TYMPANOSTOMY TUBE PLACEMENT     bilat    Family History  Problem Relation Age of Onset  . Ovarian cancer Mother   . Diabetes Father   . Throat cancer Paternal Uncle   . Diabetes Paternal Aunt   . Diabetes Maternal Uncle    Social History   Social History  . Marital status: Single    Spouse name: N/A  . Number of children: 0  . 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 Smoking status: Never Smoker  . Smokeless tobacco: Never Used  . Alcohol use Not on file  . Drug use: Unknown  . Sexual activity: Not on file   Other Topics Concern  . Not on file   Social History Narrative  . No narrative on file     Review of systems: Constitutional:  She has no weight gain or weight loss. She has no fever or chills. Eyes: No blurred vision Ears, Nose, Mouth, Throat: No dizziness, headaches or changes in hearing. No mouth sores. Cardiovascular: No chest pain, palpitations or edema. Respiratory:  No shortness of breath, wheezing or cough Gastrointestinal: She has normal bowel movements without diarrhea or constipation. She denies any nausea or vomiting. She denies blood in her stool or heart burn. Abdominal pain. Genitourinary:  + rectal bleeding (low volume) Musculoskeletal: Denies muscle weakness or joint pains.  Skin:  She has no skin changes, rashes or itching Neurological:  Denies dizziness or headaches. No neuropathy, no numbness or tingling. Psychiatric:  She denies depression or anxiety. Hematologic/Lymphatic:   No easy bruising or bleeding   Physical Exam: Blood pressure 135/88, pulse 70, temperature 98.3 F (36.8 C), temperature source Oral, resp. rate 18, height 5' 3.75" (1.619 m), weight (!) 428 lb 11.2 oz (194.5 kg), last menstrual period 03/15/2015, SpO2 99 %. General: Well dressed, well nourished in no apparent distress.   HEENT:   Normocephalic and atraumatic, no lesions.  Extraocular muscles intact. Sclerae anicteric. Pupils equal, round, reactive. No mouth sores or ulcers. Thyroid is normal size, not nodular, midline. Abdomen:  Soft, nontender, nondistended.  No palpable masses.  No hepatosplenomegaly.  No ascites. Normal bowel sounds.  No hernias.  Incisions are well healed. Genitourinary: Normal EGBUS  Vaginal cuff intact.  No bleeding or discharge.  No cul de sac fullness. Exam was limited due to body habitus. Extremities: No cyanosis, clubbing or edema.  No calf tenderness or erythema. No palpable cords. Psychiatric: Mood and affect are appropriate. Neurological: Awake, alert and oriented x 3. Sensation is intact, no neuropathy.  Musculoskeletal: No pain, normal strength and range of motion.  Donaciano Eva, MD

## 2016-02-11 ENCOUNTER — Telehealth: Payer: Self-pay | Admitting: Gastroenterology

## 2016-02-11 ENCOUNTER — Encounter (HOSPITAL_COMMUNITY): Payer: Self-pay | Admitting: *Deleted

## 2016-02-11 NOTE — Telephone Encounter (Signed)
Spoke with patient and told her I would leave a sample up front to be picked up .  Patient agreed

## 2016-02-14 ENCOUNTER — Ambulatory Visit (HOSPITAL_COMMUNITY): Payer: 59 | Admitting: Anesthesiology

## 2016-02-14 ENCOUNTER — Encounter (HOSPITAL_COMMUNITY): Payer: Self-pay

## 2016-02-14 ENCOUNTER — Encounter (HOSPITAL_COMMUNITY)
Admission: RE | Disposition: A | Discharge status: Home / Self Care | Payer: Self-pay | Source: Ambulatory Visit | Attending: Gastroenterology

## 2016-02-14 ENCOUNTER — Ambulatory Visit (HOSPITAL_COMMUNITY)
Admission: RE | Admit: 2016-02-14 | Discharge: 2016-02-14 | Disposition: A | Discharge status: Home / Self Care | Payer: 59 | Source: Ambulatory Visit | Attending: Gastroenterology | Admitting: Gastroenterology

## 2016-02-14 DIAGNOSIS — E119 Type 2 diabetes mellitus without complications: Secondary | ICD-10-CM | POA: Diagnosis not present

## 2016-02-14 DIAGNOSIS — K921 Melena: Secondary | ICD-10-CM | POA: Insufficient documentation

## 2016-02-14 DIAGNOSIS — E669 Obesity, unspecified: Secondary | ICD-10-CM | POA: Insufficient documentation

## 2016-02-14 DIAGNOSIS — R1031 Right lower quadrant pain: Secondary | ICD-10-CM | POA: Diagnosis not present

## 2016-02-14 DIAGNOSIS — K625 Hemorrhage of anus and rectum: Secondary | ICD-10-CM

## 2016-02-14 DIAGNOSIS — K644 Residual hemorrhoidal skin tags: Secondary | ICD-10-CM | POA: Insufficient documentation

## 2016-02-14 DIAGNOSIS — K648 Other hemorrhoids: Secondary | ICD-10-CM | POA: Diagnosis not present

## 2016-02-14 DIAGNOSIS — K589 Irritable bowel syndrome without diarrhea: Secondary | ICD-10-CM | POA: Insufficient documentation

## 2016-02-14 DIAGNOSIS — K649 Unspecified hemorrhoids: Secondary | ICD-10-CM

## 2016-02-14 DIAGNOSIS — Z8542 Personal history of malignant neoplasm of other parts of uterus: Secondary | ICD-10-CM | POA: Insufficient documentation

## 2016-02-14 HISTORY — PX: COLONOSCOPY WITH PROPOFOL: SHX5780

## 2016-02-14 SURGERY — COLONOSCOPY WITH PROPOFOL
Anesthesia: Monitor Anesthesia Care

## 2016-02-14 MED ORDER — PROPOFOL 500 MG/50ML IV EMUL
INTRAVENOUS | Status: DC | PRN
  Administered 2016-02-14: 100 ug/kg/min via INTRAVENOUS

## 2016-02-14 MED ORDER — ONDANSETRON HCL 4 MG/2ML IJ SOLN
INTRAMUSCULAR | Status: AC
  Filled 2016-02-14: qty 2

## 2016-02-14 MED ORDER — LIDOCAINE 2% (20 MG/ML) 5 ML SYRINGE
INTRAMUSCULAR | Status: DC | PRN
  Administered 2016-02-14: 100 mg via INTRAVENOUS

## 2016-02-14 MED ORDER — PROPOFOL 10 MG/ML IV BOLUS
INTRAVENOUS | Status: AC
  Filled 2016-02-14: qty 60

## 2016-02-14 MED ORDER — LIDOCAINE 2% (20 MG/ML) 5 ML SYRINGE
INTRAMUSCULAR | Status: AC
  Filled 2016-02-14: qty 5

## 2016-02-14 MED ORDER — PROPOFOL 10 MG/ML IV BOLUS
INTRAVENOUS | Status: DC | PRN
  Administered 2016-02-14: 20 mg via INTRAVENOUS

## 2016-02-14 MED ORDER — SODIUM CHLORIDE 0.9 % IV SOLN: INTRAVENOUS | Status: DC

## 2016-02-14 MED ORDER — ONDANSETRON HCL 4 MG/2ML IJ SOLN
INTRAMUSCULAR | Status: DC | PRN
  Administered 2016-02-14: 4 mg via INTRAVENOUS

## 2016-02-14 MED ORDER — LACTATED RINGERS IV SOLN
INTRAVENOUS | Status: DC
  Administered 2016-02-14: 07:00:00 via INTRAVENOUS

## 2016-02-14 SURGICAL SUPPLY — 21 items

## 2016-02-14 NOTE — H&P (Signed)
  HPI: This is a 36 yo woman  Chief complaint is minor rectal bleeding, massive obesity (BMI 74)  ROS: complete GI ROS as described in HPI.  Constitutional:  No unintentional weight loss   Past Medical History:  Diagnosis Date  . Anemia   . Diabetes mellitus without complication ([place])    pt states she was pre diabetic but is no longer. Takes no meds and does not check her blood sugar.   . Endometrial cancer ([place])   . IBS (irritable bowel syndrome)   . Nocturia   . Obesity   . Tingling    right hand    Past Surgical History:  Procedure Laterality Date  . HYSTEROSCOPY W/D&C N/A 01/30/2015   Procedure: DILATATION AND CURETTAGE /HYSTEROSCOPY;  Surgeon: Azucena Fallen, MD;  Location: [facility];  Service: Gynecology;  Laterality: N/A;  . ROBOTIC ASSISTED TOTAL HYSTERECTOMY WITH BILATERAL SALPINGO OOPHERECTOMY N/A 03/22/2015   Procedure: XI ROBOTIC ASSISTED TOTAL ABDOMINAL HYSTERECTOMY FOR UTERUS GREATER THAN 250 [place] WITH BILATERAL SALPINGO OOPHORECTOMY;  Surgeon: Everitt Amber, MD;  Location: [facility];  Service: Gynecology;  Laterality: N/A;  . TYMPANOSTOMY TUBE PLACEMENT     bilat     Current Facility-Administered Medications  Medication Dose Route Frequency Provider Last Rate Last Dose  . 0.9 %  sodium chloride infusion   Intravenous Continuous Milus Banister, MD      . lactated ringers infusion   Intravenous Continuous Milus Banister, MD        Allergies as of 01/01/2016 - Review Complete 01/01/2016  Allergen Reaction Noted  . Asa [aspirin] Hives 01/30/2015  . Latex Itching 02/09/2012  . Pineapple  05/27/2014  . Lactase Diarrhea 05/27/2014    Family History  Problem Relation Age of Onset  . Ovarian cancer Mother   . Diabetes Father   . Throat cancer Paternal Uncle   . Diabetes Paternal Aunt   . Diabetes Maternal Uncle     Social History   Social History  . Marital status: Single    Spouse name: N/A  . Number of children: 0  . 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 Smoking status: Never Smoker  . Smokeless tobacco: Never Used  . Alcohol use No  . Drug use: No  . Sexual activity: Not on file   Other Topics Concern  . Not on file   Social History Narrative  . No narrative on file     Physical Exam: BP 129/74   Pulse 93   Temp 98 F (36.7 C) (Oral)   Resp (!) 27   Ht 5' 3.75" (1.619 m)   Wt (!) 428 lb (194.1 kg)   LMP 03/15/2015   SpO2 98%   BMI 74.04 kg/m  Constitutional: generally well-appearing Psychiatric: alert and oriented x3 Abdomen: soft, nontender, nondistended, no obvious ascites, no peritoneal signs, normal bowel sounds No peripheral edema noted in lower extremities  Assessment and plan: 36 y.o. female with minor rectal bleeding, massive obesity  For colonoscopy today   Owens Loffler, MD [facility] 02/14/2016, 7:16 AM

## 2016-02-14 NOTE — Discharge Instructions (Signed)
YOU HAD AN ENDOSCOPIC PROCEDURE TODAY: Refer to the procedure report that was given to you for any specific questions about what was found during the examination.  If the procedure report does not answer your questions, please call your gastroenterologist to clarify.  YOU SHOULD EXPECT: Some feelings of bloating in the abdomen. Passage of more gas than usual.  Walking can help get rid of the air that was put into your GI tract during the procedure and reduce the bloating. If you had a lower endoscopy (such as a colonoscopy or flexible sigmoidoscopy) you may notice spotting of blood in your stool or on the toilet paper.   DIET: Your first meal following the procedure should be a light meal and then it is ok to progress to your normal diet.  A half-sandwich or bowl of soup is an example of a good first meal.  Heavy or fried foods are harder to digest and may make you feel nasueas or bloated.  Drink plenty of fluids but you should avoid alcoholic beverages for 24 hours.  ACTIVITY: Your care partner should take you home directly after the procedure.  You should plan to take it easy, moving slowly for the rest of the day.  You can resume normal activity the day after the procedure however you should NOT DRIVE or use heavy machinery for 24 hours (because of the sedation medicines used during the test).    SYMPTOMS TO REPORT IMMEDIATELY  A gastroenterologist can be reached at any hour.  Please call your doctor's office for any of the following symptoms:   Following lower endoscopy (colonoscopy, flexible sigmoidoscopy)  Excessive amounts of blood in the stool  Significant tenderness, worsening of abdominal pains  Swelling of the abdomen that is new, acute  Fever of 100 or higher  Following upper endoscopy (EGD, EUS, ERCP)  Vomiting of blood or coffee ground material  New, significant abdominal pain  New, significant chest pain or pain under the shoulder blades  Painful or persistently difficult  swallowing  New shortness of breath  Black, tarry-looking stools  FOLLOW UP: If any biopsies were taken you will be contacted by phone or by letter within the next 1-3 weeks.  Call your gastroenterologist if you have not heard about the biopsies in 3 weeks.  Please also call your gastroenterologist's office with any specific questions about appointments or follow up tests.       Hemorrhoids Hemorrhoids are swollen veins in and around the rectum or anus. There are two types of hemorrhoids:  Internal hemorrhoids. These occur in the veins that are just inside the rectum. They may poke through to the outside and become irritated and painful.  External hemorrhoids. These occur in the veins that are outside of the anus and can be felt as a painful swelling or hard lump near the anus. Most hemorrhoids do not cause serious problems, and they can be managed with home treatments such as diet and lifestyle changes. If home treatments do not help your symptoms, procedures can be done to shrink or remove the hemorrhoids. What are the causes? This condition is caused by increased pressure in the anal area. This pressure may result from various things, including:  Constipation.  Straining to have a bowel movement.  Diarrhea.  Pregnancy.  Obesity.  Sitting for long periods of time.  Heavy lifting or other activity that causes you to strain.  Anal sex. What are the signs or symptoms? Symptoms of this condition include:  Pain.  Anal  itching or irritation.  Rectal bleeding.  Leakage of stool (feces).  Anal swelling.  One or more lumps around the anus. How is this diagnosed? This condition can often be diagnosed through a visual exam. Other exams or tests may also be done, such as:  Examination of the rectal area with a gloved hand (digital rectal exam).  Examination of the anal canal using a small tube (anoscope).  A blood test, if you have lost a significant amount of  blood.  A test to look inside the colon (sigmoidoscopy or colonoscopy). How is this treated? This condition can usually be treated at home. However, various procedures may be done if dietary changes, lifestyle changes, and other home treatments do not help your symptoms. These procedures can help make the hemorrhoids smaller or remove them completely. Some of these procedures involve surgery, and others do not. Common procedures include:  Rubber band ligation. Rubber bands are placed at the base of the hemorrhoids to cut off the blood supply to them.  Sclerotherapy. Medicine is injected into the hemorrhoids to shrink them.  Infrared coagulation. A type of light energy is used to get rid of the hemorrhoids.  Hemorrhoidectomy surgery. The hemorrhoids are surgically removed, and the veins that supply them are tied off.  Stapled hemorrhoidopexy surgery. A circular stapling device is used to remove the hemorrhoids and use staples to cut off the blood supply to them. Follow these instructions at home: Eating and drinking  Eat foods that have a lot of fiber in them, such as whole grains, beans, nuts, fruits, and vegetables. Ask your health care provider about taking products that have added fiber (fiber supplements).  Drink enough fluid to keep your urine clear or pale yellow. Managing pain and swelling  Take warm sitz baths for 20 minutes, 3-4 times a day to ease pain and discomfort.  If directed, apply ice to the affected area. Using ice packs between sitz baths may be helpful.  Put ice in a plastic bag.  Place a towel between your skin and the bag.  Leave the ice on for 20 minutes, 2-3 times a day. General instructions  Take over-the-counter and prescription medicines only as told by your health care provider.  Use medicated creams or suppositories as told.  Exercise regularly.  Go to the bathroom when you have the urge to have a bowel movement. Do not wait.  Avoid straining to  have bowel movements.  Keep the anal area dry and clean. Use wet toilet paper or moist towelettes after a bowel movement.  Do not sit on the toilet for long periods of time. This increases blood pooling and pain. Contact a health care provider if:  You have increasing pain and swelling that are not controlled by treatment or medicine.  You have uncontrolled bleeding.  You have difficulty having a bowel movement, or you are unable to have a bowel movement.  You have pain or inflammation outside the area of the hemorrhoids. This information is not intended to replace advice given to you by your health care provider. Make sure you discuss any questions you have with your health care provider. Document Released: 01/18/2000 Document Revised: 06/20/2015 Document Reviewed: 10/04/2014 Elsevier Interactive Patient Education  2017 [employer].

## 2016-02-14 NOTE — Transfer of Care (Signed)
Immediate Anesthesia Transfer of Care Note  Patient: Krista White  Procedure(s) Performed: Procedure(s): COLONOSCOPY WITH PROPOFOL (N/A)  Patient Location: PACU  Anesthesia Type:MAC  Level of Consciousness:  sedated, patient cooperative and responds to stimulation  Airway & Oxygen Therapy:Patient Spontanous Breathing and Patient connected to face mask oxgen  Post-op Assessment:  Report given to PACU RN and Post -op Vital signs reviewed and stable  Post vital signs:  Reviewed and stable  Last Vitals:  Vitals:   02/14/16 0646  BP: 129/74  Pulse: 93  Resp: (!) 27  Temp: 93.7 C    Complications: No apparent anesthesia complications

## 2016-02-14 NOTE — Anesthesia Postprocedure Evaluation (Signed)
Anesthesia Post Note  Patient: Krista White  Procedure(s) Performed: Procedure(s) (LRB): COLONOSCOPY WITH PROPOFOL (N/A)  Patient location during evaluation: Endoscopy Anesthesia Type: MAC Level of consciousness: awake, awake and alert and oriented Pain management: pain level controlled Vital Signs Assessment: post-procedure vital signs reviewed and stable Respiratory status: spontaneous breathing, respiratory function stable and nonlabored ventilation Cardiovascular status: blood pressure returned to baseline Anesthetic complications: no       Last Vitals:  Vitals:   02/14/16 0646 02/14/16 0753  BP: 129/74   Pulse: 93 86  Resp: (!) 27 18  Temp: 36.7 C 36.7 C    Last Pain:  Vitals:   02/14/16 0646  TempSrc: Oral                 Lane Eland COKER

## 2016-02-14 NOTE — Op Note (Signed)
[facility] Patient Name: Krista White Procedure Date: 02/14/2016 MRN: [id number] Attending MD: Milus Banister , MD Date of Birth: April 08, 1980 CSN: [id number] Age: 36 Admit Type: Outpatient Procedure:                Colonoscopy Indications:              Hematochezia Providers:                Milus Banister, MD, Cleda Daub, RN, Alfonso Patten, Technician, Marla Roe, CRNA Referring MD:              Medicines:                Monitored Anesthesia Care Complications:            No immediate complications. Estimated blood loss:                            None. Estimated Blood Loss:     Estimated blood loss: none. Procedure:                Pre-Anesthesia Assessment:                           - Prior to the procedure, a History and Physical                            was performed, and patient medications and                            allergies were reviewed. The patient's tolerance of                            previous anesthesia was also reviewed. The risks                            and benefits of the procedure and the sedation                            options and risks were discussed with the patient.                            All questions were answered, and informed consent                            was obtained. Prior Anticoagulants: The patient has                            taken no previous anticoagulant or antiplatelet                            agents. ASA Grade Assessment: IV - A patient with                            severe systemic  disease that is a constant threat                            to life. After reviewing the risks and benefits,                            the patient was deemed in satisfactory condition to                            undergo the procedure.                           After obtaining informed consent, the colonoscope                            was passed under direct vision. Throughout the                       procedure, the patient's blood pressure, pulse, and                            oxygen saturations were monitored continuously. The                            [id number] scope was introduced through                            the anus and advanced to the the cecum, identified                            by appendiceal orifice and ileocecal valve. The                            colonoscopy was performed without difficulty. The                            patient tolerated the procedure well. The quality                            of the bowel preparation was excellent. The                            ileocecal valve, appendiceal orifice, and rectum                            were photographed. Scope In: 7:33:35 AM Scope Out: 7:43:57 AM Scope Withdrawal Time: 0 hours 5 minutes 53 seconds  Total Procedure Duration: 0 hours 10 minutes 22 seconds  Findings:      External and internal hemorrhoids were found. The hemorrhoids were small.      The exam was otherwise without abnormality on direct and retroflexion       views. Impression:               - External and internal hemorrhoids.                           -  The examination was otherwise normal on direct                            and retroflexion views.                           - No specimens collected. Moderate Sedation:      N/A- Per Anesthesia Care Recommendation:           - Patient has a contact number available for                            emergencies. The signs and symptoms of potential                            delayed complications were discussed with the                            patient. Return to normal activities tomorrow.                            Written discharge instructions were provided to the                            patient.                           - Resume previous diet.                           - Continue present medications.                           - Repeat colonoscopy in 10  years for screening. Procedure Code(s):        --- Professional ---                           [id number], Colonoscopy, flexible; diagnostic, including                            collection of specimen(s) by brushing or washing,                            when performed (separate procedure) Diagnosis Code(s):        --- Professional ---                           K64.8, Other hemorrhoids                           K92.1, Melena (includes Hematochezia) CPT copyright 2016 American Medical Association. All rights reserved. The codes documented in this report are preliminary and upon coder review may  be revised to meet current compliance requirements. Milus Banister, MD 02/14/2016 7:45:43 AM This report has been signed electronically. Number of Addenda: 0

## 2016-02-14 NOTE — Anesthesia Preprocedure Evaluation (Signed)
Anesthesia Evaluation  Patient identified by MRN, date of birth, ID band Patient awake    Reviewed: Allergy & Precautions, NPO status , Patient's Chart, lab work & pertinent test results  Airway Mallampati: III  TM Distance: >3 FB Neck ROM: Full    Dental  (+) Teeth Intact, Dental Advisory Given   Pulmonary    breath sounds clear to auscultation       Cardiovascular  Rhythm:Regular Rate:Normal     Neuro/Psych    GI/Hepatic   Endo/Other  diabetes  Renal/GU      Musculoskeletal   Abdominal (+) + obese,   Peds  Hematology   Anesthesia Other Findings   Reproductive/Obstetrics                             Anesthesia Physical Anesthesia Plan  ASA: III  Anesthesia Plan: MAC   Post-op Pain Management:    Induction: Intravenous  Airway Management Planned: Natural Airway and Simple Face Mask  Additional Equipment:   Intra-op Plan:   Post-operative Plan:   Informed Consent: I have reviewed the patients History and Physical, chart, labs and discussed the procedure including the risks, benefits and alternatives for the proposed anesthesia with the patient or authorized representative who has indicated his/her understanding and acceptance.   Dental advisory given  Plan Discussed with: CRNA and Anesthesiologist  Anesthesia Plan Comments: (Morbid obesity S/P hysterectomy for endometrial Ca, no difficulty noted with intubtion  Plan start with MAC, May need GA with ETT)        Anesthesia Quick Evaluation

## 2016-02-15 ENCOUNTER — Encounter (HOSPITAL_COMMUNITY): Payer: Self-pay | Admitting: Gastroenterology

## 2016-05-11 NOTE — Progress Notes (Deleted)
FOLLOW-UP ENDOMETRIAL CANCER  Assessment:    36 y.o. year old with Stage IA Grade 1 endometrioid endometrial cancer.   S/p robotic assisted total hysterectomy (uterus >250gm) with BSO on 03/22/15. positive LVSI, 20% myometrial invasion, positive pelvic washings grade 1 tumor and lymph nodes not assessed surgically due to severe morbid obesity, but radiographically normal. Her tumor is ER and PR positive.  Morbid obesity with continued weight gain  No evidence of recurrence on exam.  New rectal bleeding without hemorrhoids (external) on exam.   Plan: 1) Endometrial cancer: She had some poor prognostic factors (LVSI and positive washings) but other reassuring features (grade 1 tumor and minimal Myo invasion),. She did not meet high/intermediate risk factors and therefore was not a candidate for adjuvant chemotherapy or radiation. However, due to the finding of positive washings, she was treated with a 6 month course of progestins (megace), until September, 2017. She is NED for recurrence on today's exam. We discussed the risk for recurrence and symptoms asssociated with recurrence and to notify us of these if they develop sooner. 2) Rectal bleeding: s/p GI consult - colonoscopy January, 2018 showed external and internal hemorrhoids. 3) abdominal pain: unexplained on exam or CT scan from September, 2017. May be adhesive disease.  4) Morbid obesity (BMI 72kg/m2, up from 70kg/m2) I discussed that her obesity places her at increased risk for recurrence and death from her cancer, and from other obesity related diseases. We discussed options for weight loss. She is interested in discussing bariatric surgery with surgeons. We have facilitated referral. Of note, we provided her with this information in March, 2017 however she did not make the appointments at that time. 5)  Return to clinic in 3 months for routine surveillance.  HPI:  Krista White is a 36 y.o. year old G0 initially seen in consultation  on 02/21/15 eferred by Dr Benjie Karvonen for grade 2 endometrial cancer.  She has morbid obesity with a BMI of 72kg/m2. She then underwent a robotic assisted total hysterectomy, BSO for a uterus of >250gm on 1/95/09 without complications.  She acquired perineal lacerations during specimen delivery vaginally. Her postoperative course was uncomplicated.  Her final pathologic diagnosis is a Stage IA Grade 1 endometrioid endometrial cancer with positive lymphovascular space invasion, 6/31 mm (20%) of myometrial invasion and lymph nodes were grossly negative on preoperative imaging (CT abdo/pelvis). She had positive washings on her pathology. ER/PR was positive in her primary tumor.  In September 2017 she reported new abdominal pains. CT abdo/pelvis was unremarkable.  Interval Hx: She is seen today for a routine surveillance check. She denies new symptoms of recurrence. She denies pain. She denies new health concerns. No vaginal bleeding. She stopped taking the megace in June, 2016. She has ***gained 10pounds from her last visit 3 months ago in June (419lbs to 429lbs).    Past Medical History:  Diagnosis Date  . Anemia   . Diabetes mellitus without complication ([place])    pt states she was pre diabetic but is no longer. Takes no meds and does not check her blood sugar.   . Endometrial cancer ([place])   . IBS (irritable bowel syndrome)   . Nocturia   . Obesity   . Tingling    right hand   Past Surgical History:  Procedure Laterality Date  . COLONOSCOPY WITH PROPOFOL N/A 02/14/2016   Procedure: COLONOSCOPY WITH PROPOFOL;  Surgeon: Milus Banister, MD;  Location: [facility];  Service: Endoscopy;  Laterality: N/A;  . HYSTEROSCOPY W/D&C  N/A 01/30/2015   Procedure: DILATATION AND CURETTAGE /HYSTEROSCOPY;  Surgeon: Azucena Fallen, MD;  Location: [facility];  Service: Gynecology;  Laterality: N/A;  . ROBOTIC ASSISTED TOTAL HYSTERECTOMY WITH BILATERAL SALPINGO OOPHERECTOMY N/A 03/22/2015   Procedure: XI ROBOTIC ASSISTED  TOTAL ABDOMINAL HYSTERECTOMY FOR UTERUS GREATER THAN 250 [place] WITH BILATERAL SALPINGO OOPHORECTOMY;  Surgeon: Everitt Amber, MD;  Location: [facility];  Service: Gynecology;  Laterality: N/A;  . TYMPANOSTOMY TUBE PLACEMENT     bilat    Family History  Problem Relation Age of Onset  . Ovarian cancer Mother   . Diabetes Father   . Throat cancer Paternal Uncle   . Diabetes Paternal Aunt   . Diabetes Maternal Uncle    Social History   Social History  . Marital status: Single    Spouse name: N/A  . Number of children: 0  . 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 Smoking status: Never Smoker  . Smokeless tobacco: Never Used  . Alcohol use No  . Drug use: No  . Sexual activity: Not on file   Other Topics Concern  . Not on file   Social History Narrative  . No narrative on file     Review of systems: Constitutional:  She has no weight gain or weight loss. She has no fever or chills. Eyes: No blurred vision Ears, Nose, Mouth, Throat: No dizziness, headaches or changes in hearing. No mouth sores. Cardiovascular: No chest pain, palpitations or edema. Respiratory:  No shortness of breath, wheezing or cough Gastrointestinal: She has normal bowel movements without diarrhea or constipation. She denies any nausea or vomiting. She denies blood in her stool or heart burn. Abdominal pain. Genitourinary:  + rectal bleeding (low volume) Musculoskeletal: Denies muscle weakness or joint pains.  Skin:  She has no skin changes, rashes or itching Neurological:  Denies dizziness or headaches. No neuropathy, no numbness or tingling. Psychiatric:  She denies depression or anxiety. Hematologic/Lymphatic:   No easy bruising or bleeding   Physical Exam: Last menstrual period 03/22/2015. General: Well dressed, well nourished in no apparent distress.   HEENT:  Normocephalic and atraumatic, no lesions.  Extraocular muscles intact. Sclerae  anicteric. Pupils equal, round, reactive. No mouth sores or ulcers. Thyroid is normal size, not nodular, midline. Abdomen:  Soft, nontender, nondistended.  No palpable masses.  No hepatosplenomegaly.  No ascites. Normal bowel sounds.  No hernias.  Incisions are well healed. Genitourinary: Normal EGBUS  Vaginal cuff intact.  No bleeding or discharge.  No cul de sac fullness. Exam was limited due to body habitus. Extremities: No cyanosis, clubbing or edema.  No calf tenderness or erythema. No palpable cords. Psychiatric: Mood and affect are appropriate. Neurological: Awake, alert and oriented x 3. Sensation is intact, no neuropathy.  Musculoskeletal: No pain, normal strength and range of motion.  Donaciano Eva, MD

## 2016-05-12 ENCOUNTER — Ambulatory Visit: Payer: Self-pay | Admitting: Gynecologic Oncology

## 2016-05-13 ENCOUNTER — Telehealth: Payer: Self-pay | Admitting: *Deleted

## 2016-05-13 NOTE — Telephone Encounter (Signed)
I have called and left the patient a message to the office back. Patient needs to reschedule her [facility] appt for April 9th.

## 2017-03-12 IMAGING — CT CT ABD-PELV W/ CM
2 of 4 series · 17 of 46 positions shown, 19 images · IV contrast (OMNIPAQUE)
Comparison: 02/09/2012

CLINICAL DATA: Newly diagnosed high-grade endometrial cancer,
evaluate for metastatic disease

EXAM:
CT ABDOMEN AND PELVIS WITH CONTRAST
TECHNIQUE: Multidetector CT imaging of the abdomen and pelvis was performed
using the standard protocol following bolus administration of
intravenous contrast.
CONTRAST:  100mL OMNIPAQUE IOHEXOL 300 MG/ML  SOLN

[Series 2: rtn a/p with · axial · 0.76mm/px · z∈[-498,-92]mm · 14 of 89 slices shown, 16 images]
[im 4/89  soft-tissue]
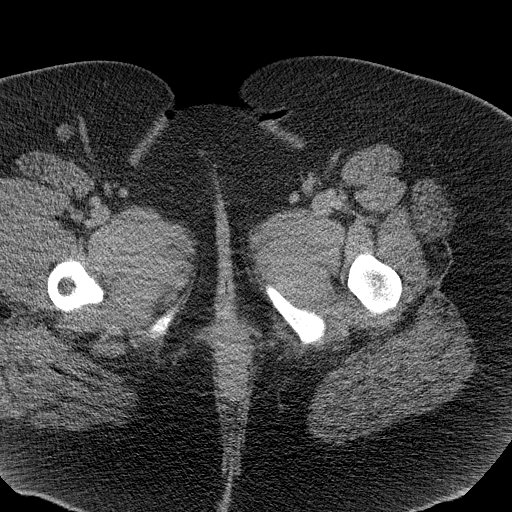
[im 4/89  bone]
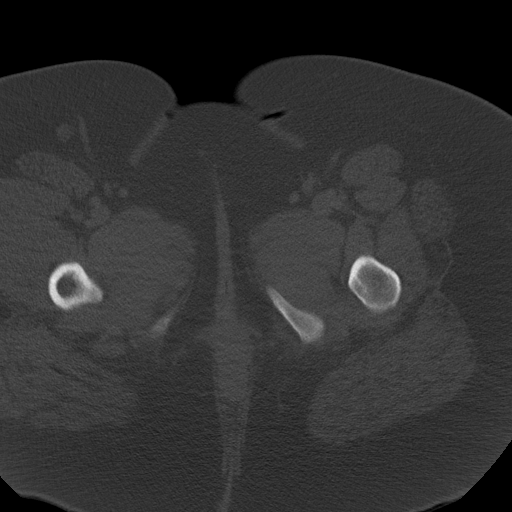
[im 12/89  soft-tissue]
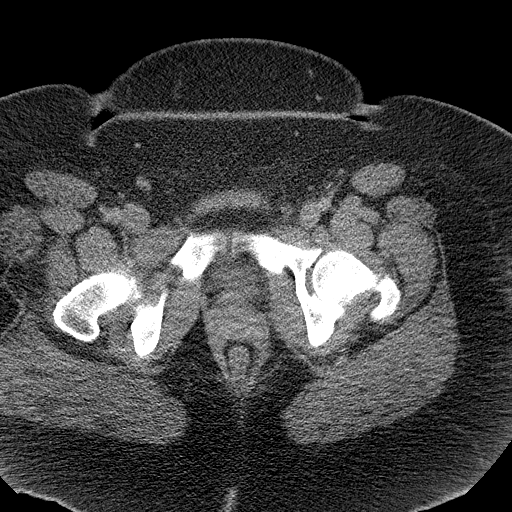
[im 19/89  soft-tissue]
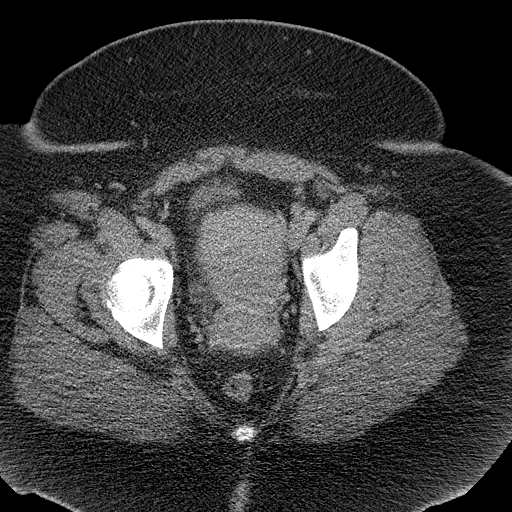
[im 23/89  soft-tissue]
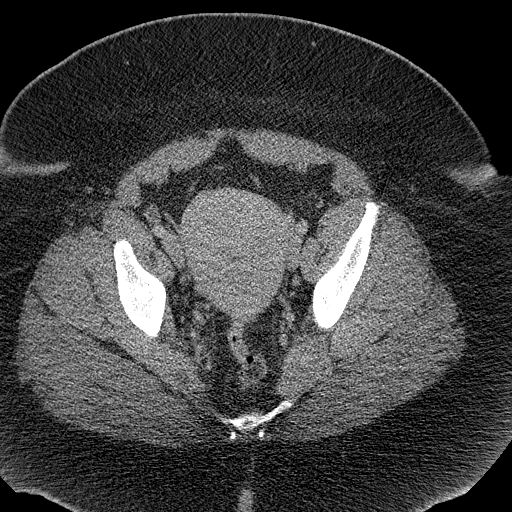
[im 30/89  soft-tissue]
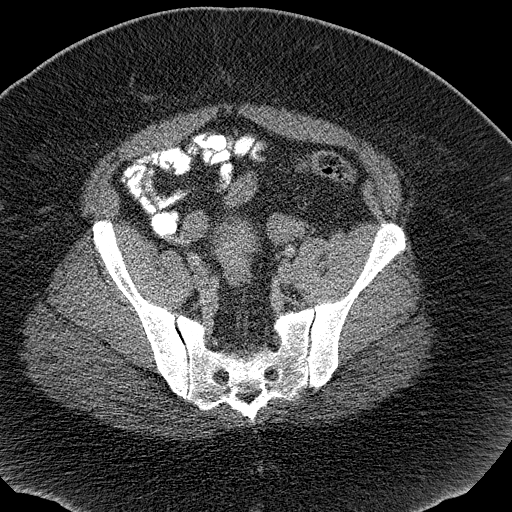
[im 37/89  soft-tissue]
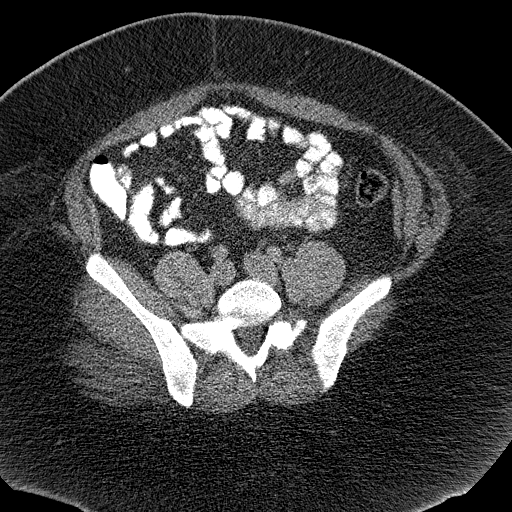
[im 41/89  soft-tissue]
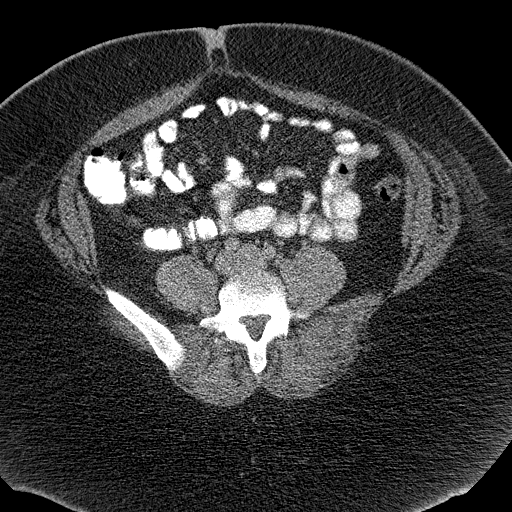
[im 48/89  soft-tissue]
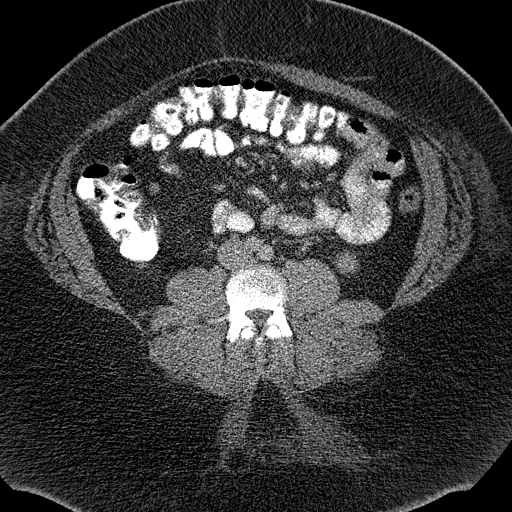
[im 52/89  soft-tissue]
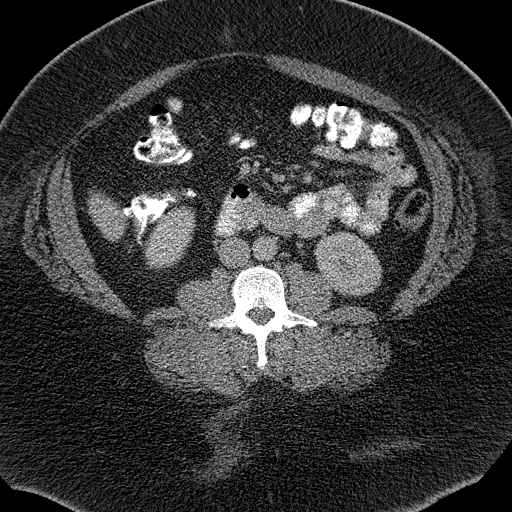
[im 52/89  bone]
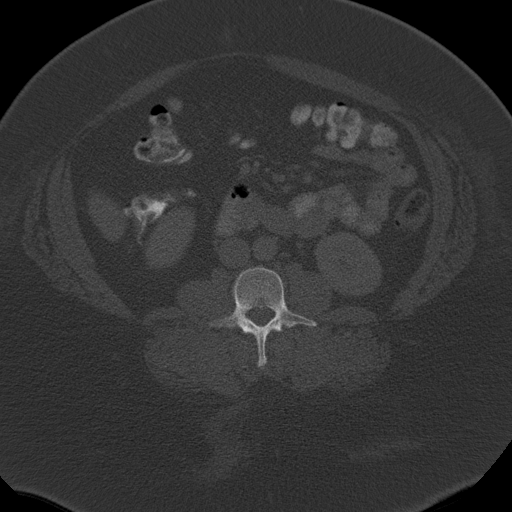
[im 59/89  soft-tissue]
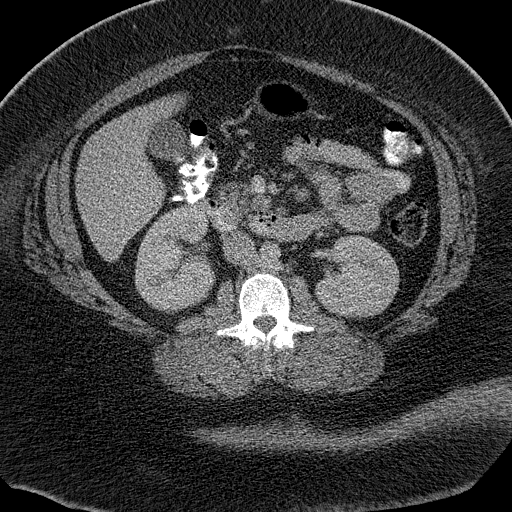
[im 67/89  soft-tissue]
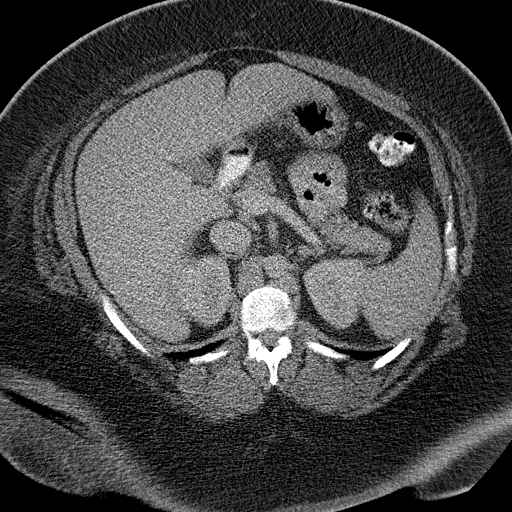
[im 70/89  soft-tissue]
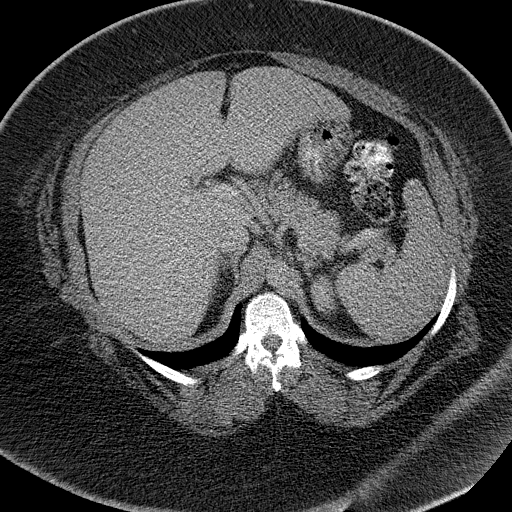
[im 78/89  soft-tissue]
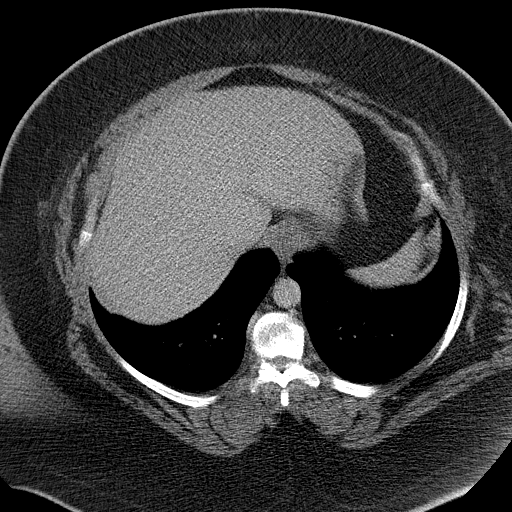
[im 85/89  soft-tissue]
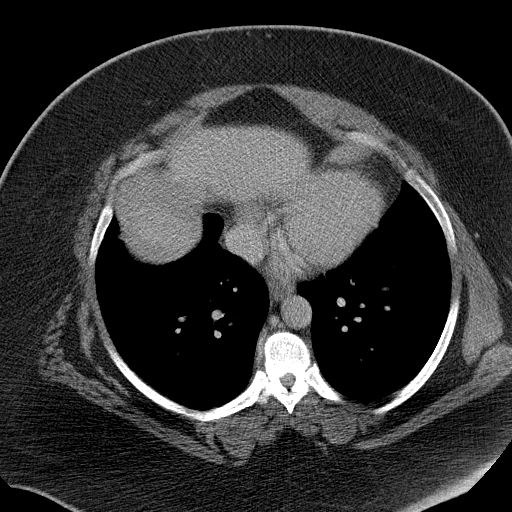

[Series 602: cor · coronal · 0.87mm/px · 3 of 109 slices shown]
[im 37/109  soft-tissue]
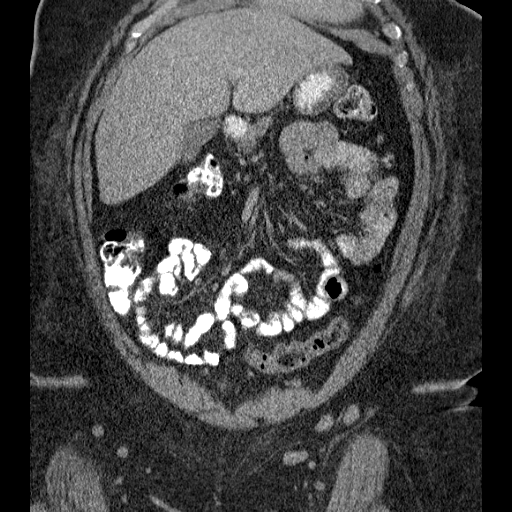
[im 49/109  soft-tissue]
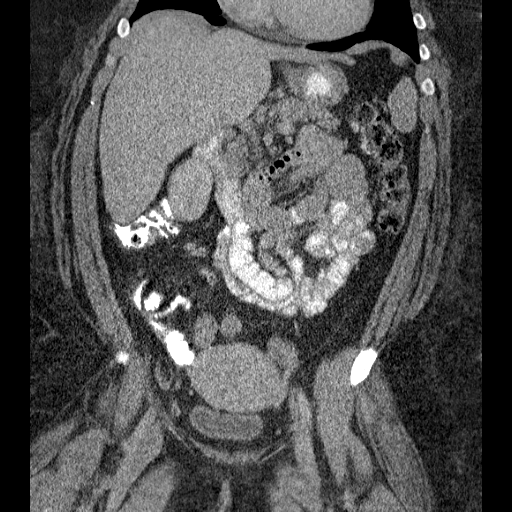
[im 61/109  soft-tissue]
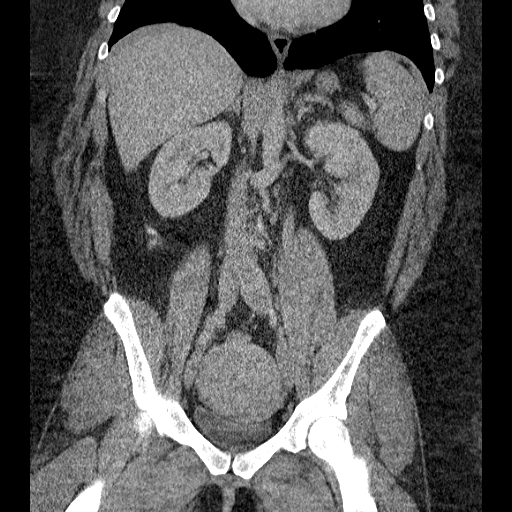

[17 of 46 positions shown; findings below may reference images not displayed]

FINDINGS: Lower chest:  Lung bases are clear.

Hepatobiliary: Liver is within normal limits. No
suspicious/enhancing hepatic lesions.

Gallbladder is notable for layering sludge and/or noncalcified
gallstones (series 2/ image 29). No associated inflammatory changes.

No intrahepatic or extrahepatic ductal dilatation.

Pancreas: Within normal limits.

Spleen: Within normal limits.

Adrenals/Urinary Tract: Adrenal glands are within normal limits.

Kidneys are within normal limits.  No hydronephrosis.

Bladder is within normal limits.

Stomach/Bowel: Stomach is within normal limits.

No evidence of bowel obstruction.

Normal appendix (series 2/ image 47).

Vascular/Lymphatic: No evidence of abdominal aortic aneurysm.

Circumaortic left renal vein.

No suspicious abdominopelvic lymphadenopathy.

Mildly prominent nodes with preservation of the normal fatty hilum
in the bilateral deep inguinal regions (series 2/ images 65 and 69).
10 mm short axis left inguinal node (series 2/ image 80), within
normal limits.

Reproductive: Uterus is mildly heterogeneous but otherwise
unremarkable in this patient with known endometrial cancer.

Bilateral ovaries are within normal limits.

Other: No abdominopelvic ascites.

Musculoskeletal: Mild degenerative changes of the lower thoracic
spine.
IMPRESSION: Uterus is mildly heterogeneous in this patient with known
endometrial cancer.

No findings specific for metastatic disease.

## 2017-03-22 IMAGING — CR DG CHEST 2V
2 series · 2 of 2 positions shown · non-contrast
Comparison: None.

CLINICAL DATA: Endometrial carcinoma. Preop respiratory exam for
hysterectomy.

EXAM:
CHEST  2 VIEW

[w chest pa]
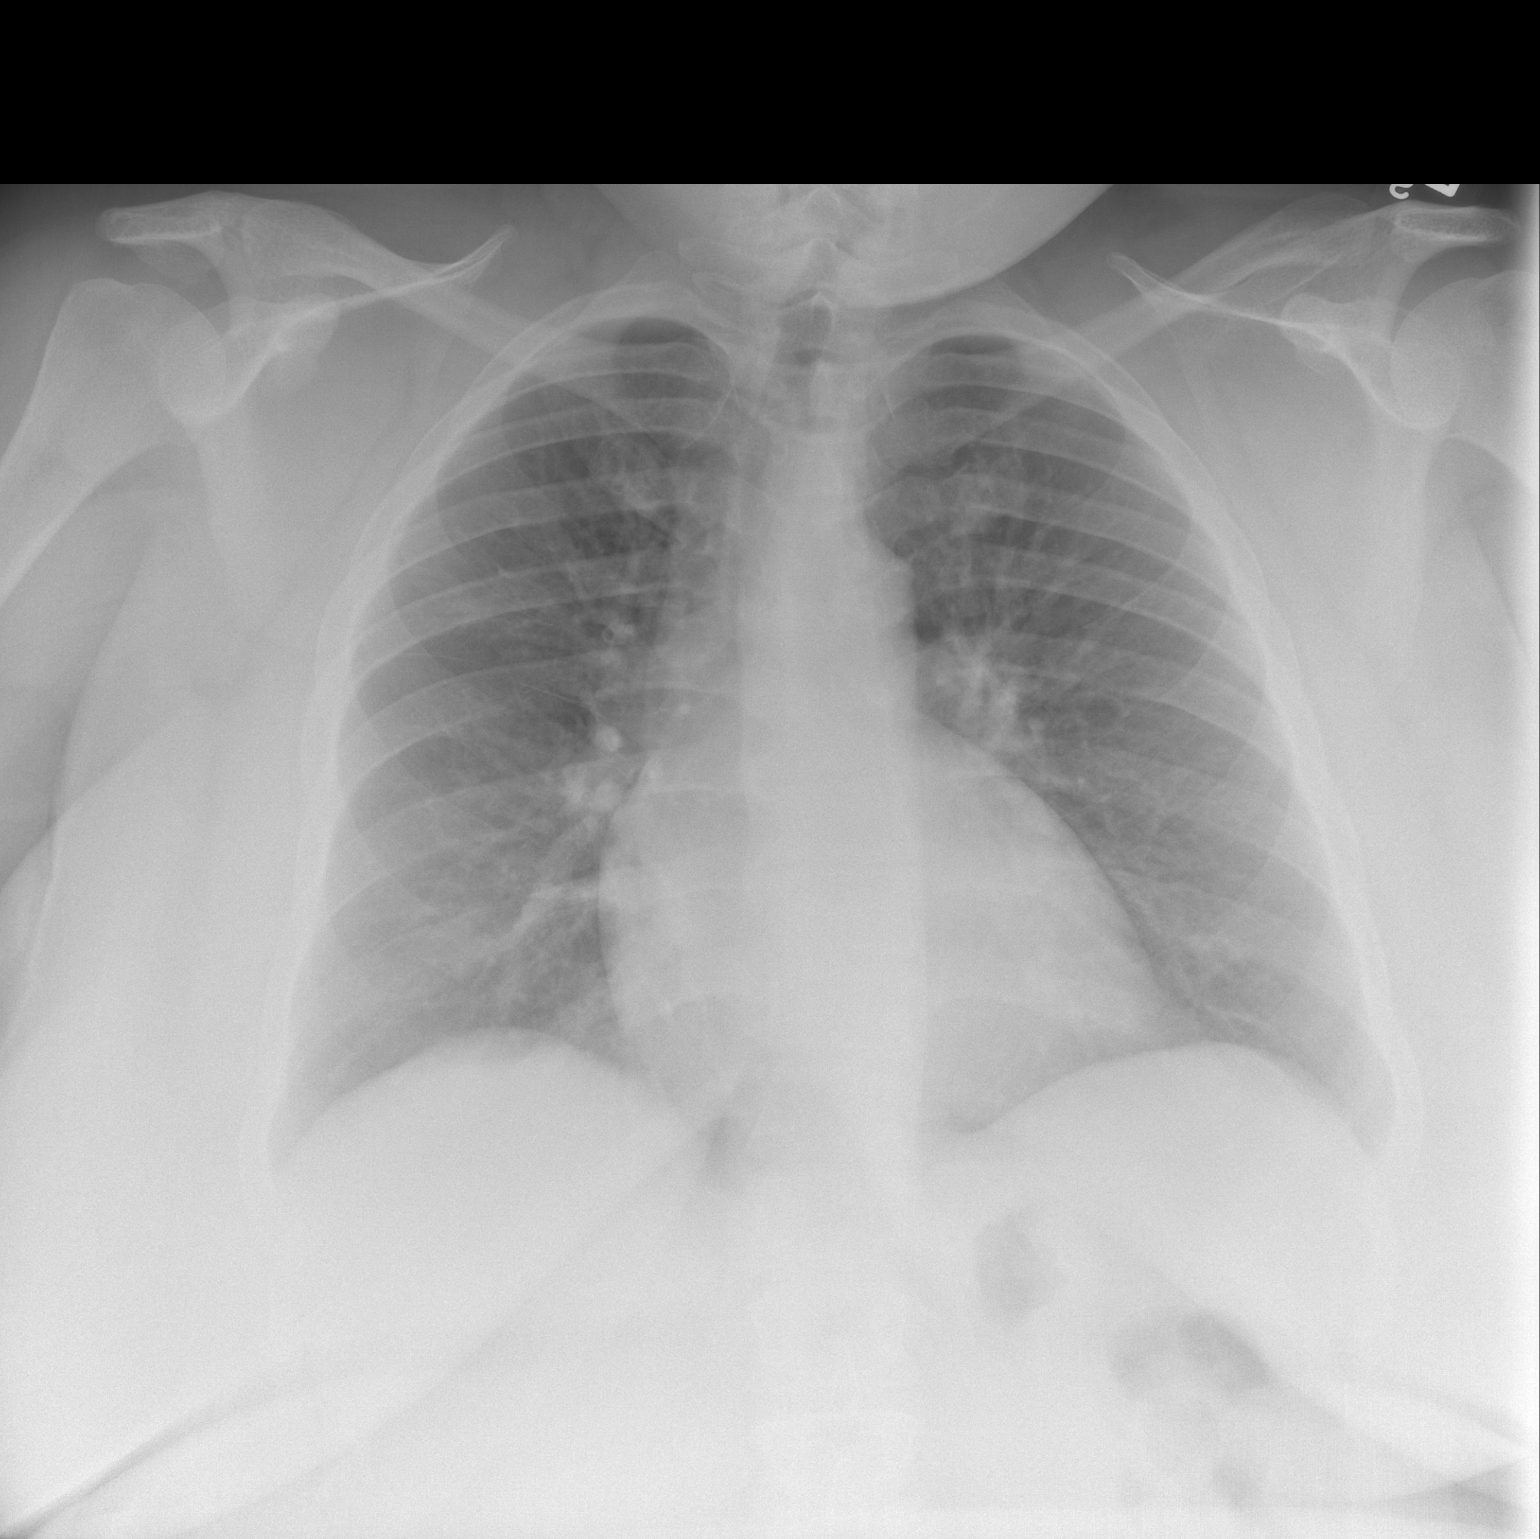

[w chest ap *]
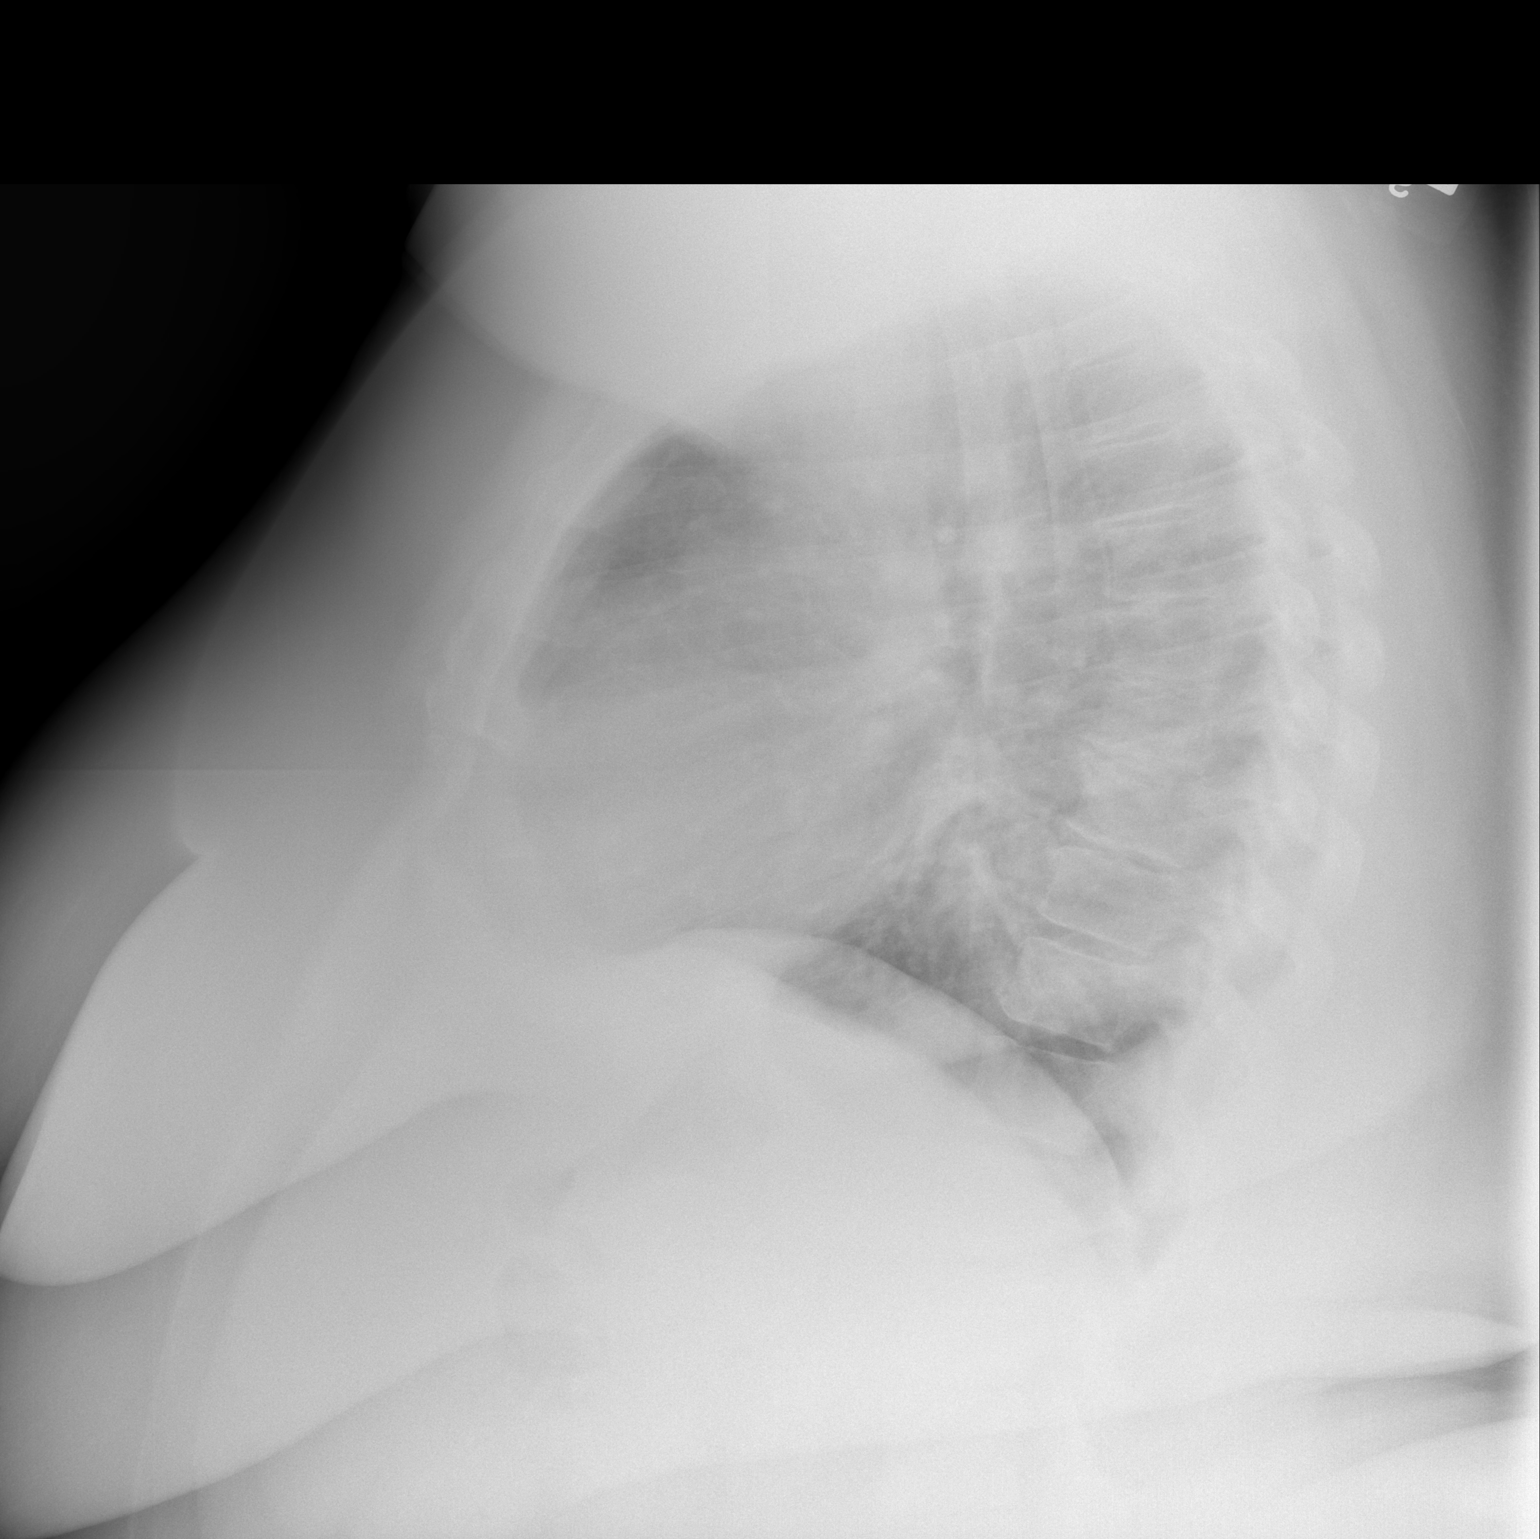

[2 of 2 positions shown; findings below may reference images not displayed]

FINDINGS: The heart size and mediastinal contours are within normal limits.
Both lungs are clear. No mass, infiltrate, or pleural effusion
identified. Mild mid thoracic spine degenerative disc disease noted.
IMPRESSION: No active cardiopulmonary disease.

## 2017-11-24 ENCOUNTER — Other Ambulatory Visit (INDEPENDENT_AMBULATORY_CARE_PROVIDER_SITE_OTHER): Payer: PRIVATE HEALTH INSURANCE

## 2017-11-24 ENCOUNTER — Ambulatory Visit (INDEPENDENT_AMBULATORY_CARE_PROVIDER_SITE_OTHER): Payer: PRIVATE HEALTH INSURANCE | Admitting: Physician Assistant

## 2017-11-24 ENCOUNTER — Encounter: Payer: Self-pay | Admitting: Physician Assistant

## 2017-11-24 VITALS — BP 130/88 | HR 72 | Ht 63.75 in | Wt >= 6400 oz

## 2017-11-24 DIAGNOSIS — K529 Noninfective gastroenteritis and colitis, unspecified: Secondary | ICD-10-CM

## 2017-11-24 DIAGNOSIS — R1084 Generalized abdominal pain: Secondary | ICD-10-CM

## 2017-11-24 LAB — COMPREHENSIVE METABOLIC PANEL
ALT: 20 U/L (ref 0–35)
AST: 18 U/L (ref 0–37)
Albumin: 4.1 g/dL (ref 3.5–5.2)
Alkaline Phosphatase: 100 U/L (ref 39–117)
BUN: 12 mg/dL (ref 6–23)
CALCIUM: 9.1 mg/dL (ref 8.4–10.5)
CHLORIDE: 105 meq/L (ref 96–112)
CO2: 27 meq/L (ref 19–32)
Creatinine, Ser: 0.73 mg/dL (ref 0.40–1.20)
GFR: 114.3 mL/min (ref >60.00)
Glucose, Bld: 128 mg/dL — ABNORMAL HIGH (ref 70–99)
POTASSIUM: 4.2 meq/L (ref 3.5–5.1)
Sodium: 140 mEq/L (ref 135–145)
Total Bilirubin: 0.3 mg/dL (ref 0.2–1.2)
Total Protein: 7.9 g/dL (ref 6.0–8.3)

## 2017-11-24 LAB — CBC WITH DIFFERENTIAL/PLATELET
BASOS ABS: 0 10*3/uL (ref 0.0–0.1)
Basophils Relative: 0.3 % (ref 0.0–3.0)
EOS PCT: 1.4 % (ref 0.0–5.0)
Eosinophils Absolute: 0.1 10*3/uL (ref 0.0–0.7)
HEMATOCRIT: 38.4 % (ref 36.0–46.0)
Hemoglobin: 12.8 g/dL (ref 12.0–15.0)
LYMPHS PCT: 41.9 % (ref 12.0–46.0)
Lymphs Abs: 2.6 10*3/uL (ref 0.7–4.0)
MCHC: 33.3 g/dL (ref 30.0–36.0)
MCV: 80.9 fl (ref 78.0–100.0)
MONOS PCT: 6.4 % (ref 3.0–12.0)
Monocytes Absolute: 0.4 10*3/uL (ref 0.1–1.0)
NEUTROS ABS: 3.1 10*3/uL (ref 1.4–7.7)
Neutrophils Relative %: 50 % (ref 43.0–77.0)
PLATELETS: 360 10*3/uL (ref 150.0–400.0)
RBC: 4.75 Mil/uL (ref 3.87–5.11)
RDW: 13.4 % (ref 11.5–15.5)
WBC: 6.1 10*3/uL (ref 4.0–10.5)

## 2017-11-24 LAB — TSH: TSH: 2.03 u[IU]/mL (ref 0.35–4.50)

## 2017-11-24 MED ORDER — DICYCLOMINE HCL 10 MG PO CAPS: 10.0000 mg | ORAL_CAPSULE | Freq: Four times a day (QID) | ORAL | 2 refills | Status: DC

## 2017-11-24 NOTE — Progress Notes (Signed)
Chief Complaint: Chronic Diarrhea  HPI:    Krista White is a 37 year old African-American female, known to Dr. Ardis Hughs for screening colonoscopy, with a past medical history as listed below, who was referred to our clinic by [facility], for chronic diarrhea.    02/14/2016 colonoscopy for my minor rectal bleeding. Dr. Ardis Hughs with external and internal hemorrhoids and otherwise normal.  Repeat recommended in 10 years.      11/16/2017 office visit note from PCP describes 2 years of progressively worsening diarrhea.  Initially was intermittent possibly triggered by lactose foods but had progressively worsened.  Typically diarrhea within 30 minutes of eating.  History of endometrial cancer status post TAH/BSO recommend she use over-the-counter Imodium 1 tab twice daily.    Today, patient explains that for her whole life about 30 minutes after eating she has had a bowel movement, typically these were solid but in the past 2 years she has had an increasing amount of loose stool now associated with a cramping abdominal pain.  Tells me now that immediately after eating or drinking, even sometimes just water, she will develop a sharp/cramping abdominal pain which "feels like food poisoning" rated as 8-9/10 in severity that will last until she is able to get all of her stool out.  Tells me that over the span of about an hour she will have 2-3 episodes of loose stool and sometimes this is nothing but mucus.  Vague history of seeing [facility] and being described "blue pills" which did not help, though at that time there was a stronger time between dairy products and the symptoms.  Associated symptoms include infrequent heartburn.    Denies fever, chills, weight loss, anorexia, nausea, vomiting or symptoms that awaken her from sleep.     Past Medical History:  Diagnosis Date  . Anemia   . Diabetes mellitus without complication ([place])    pt states she was pre diabetic but is no longer.  Takes no meds and does not check her blood sugar.   . Endometrial cancer ([place])   . IBS (irritable bowel syndrome)   . Nocturia   . Obesity   . Tingling    right hand    Past Surgical History:  Procedure Laterality Date  . COLONOSCOPY WITH PROPOFOL N/A 02/14/2016   Procedure: COLONOSCOPY WITH PROPOFOL;  Surgeon: Milus Banister, MD;  Location: [facility];  Service: Endoscopy;  Laterality: N/A;  . HYSTEROSCOPY W/D&C N/A 01/30/2015   Procedure: DILATATION AND CURETTAGE /HYSTEROSCOPY;  Surgeon: Azucena Fallen, MD;  Location: [facility];  Service: Gynecology;  Laterality: N/A;  . ROBOTIC ASSISTED TOTAL HYSTERECTOMY WITH BILATERAL SALPINGO OOPHERECTOMY N/A 03/22/2015   Procedure: XI ROBOTIC ASSISTED TOTAL ABDOMINAL HYSTERECTOMY FOR UTERUS GREATER THAN 250 [place] WITH BILATERAL SALPINGO OOPHORECTOMY;  Surgeon: Everitt Amber, MD;  Location: [facility];  Service: Gynecology;  Laterality: N/A;  . TYMPANOSTOMY TUBE PLACEMENT     bilat     No current outpatient medications on file.   No current facility-administered medications for this visit.     Allergies as of 11/24/2017 - Review Complete 11/24/2017  Allergen Reaction Noted  . Asa [aspirin] Hives 01/30/2015  . Latex Itching 02/09/2012  . Pineapple  05/27/2014  . Lactase Diarrhea 05/27/2014    Family History  Problem Relation Age of Onset  . Ovarian cancer Mother   . Diabetes Father   . Heart attack Father   . Throat cancer Paternal Uncle   . Diabetes Paternal Aunt   .  Diabetes Maternal Uncle     Social History   Socioeconomic History  . Marital status: Single    Spouse name: Not on file  . Number of children: 0  . Years of education: Not on file  . Highest education level: Not on file  Occupational History  . Occupation: Probation officer  Social Needs  . Financial resource strain: Not on file  . Food insecurity:    Worry: Not on file    Inability: Not on file  . Transportation needs:    Medical: Not on file    Non-medical:  Not on file  Tobacco Use  . Smoking status: Never Smoker  . Smokeless tobacco: Never Used  Substance and Sexual Activity  . Alcohol use: No    Alcohol/week: 0.0 standard drinks  . Drug use: No  . Sexual activity: Not on file  Lifestyle  . Physical activity:    Days per week: Not on file    Minutes per session: Not on file  . Stress: Not on file  Relationships  . Social connections:    Talks on phone: Not on file    Gets together: Not on file    Attends religious service: Not on file    Active member of club or organization: Not on file    Attends meetings of clubs or organizations: Not on file    Relationship status: Not on file  . Intimate partner violence:    Fear of current or ex partner: Not on file    Emotionally abused: Not on file    Physically abused: Not on file    Forced sexual activity: Not on file  Other Topics Concern  . Not on file  Social History Narrative  . Not on file    Review of Systems:    Constitutional: No weight loss, fever or chills Skin: No rash  Cardiovascular: No chest pain Respiratory: No SOB  Gastrointestinal: See HPI and otherwise negative   Physical Exam:  Vital signs: BP 130/88 (BP Location: Left Wrist, Patient Position: Sitting, Cuff Size: Normal)   Pulse 72   Ht 5' 3.75" (1.619 m)   Wt (!) 432 lb (196 kg)   LMP 03/22/2015 Comment: has been spotting for a while  BMI 74.74 kg/m   Constitutional:   Pleasant obese AA female appears to be in NAD, Well developed, Well nourished, alert and cooperative Head:  Normocephalic and atraumatic. Eyes:   PEERL, EOMI. No icterus. Conjunctiva pink. Ears:  Normal auditory acuity. Neck:  Supple Throat: Oral cavity and pharynx without inflammation, swelling or lesion.  Respiratory: Respirations even and unlabored. Lungs clear to auscultation bilaterally.   No wheezes, crackles, or rhonchi.  Cardiovascular: Normal S1, S2. No MRG. Regular rate and rhythm. No peripheral edema, cyanosis or pallor.    Gastrointestinal:  Soft, nondistended, nontender. No rebound or guarding. Normal bowel sounds. No appreciable masses or hepatomegaly. Rectal:  Not performed.  Msk:  Symmetrical without gross deformities. Without edema, no deformity or joint abnormality.  Neurologic:  Alert and  oriented x4;  grossly normal neurologically.  Skin:   Dry and intact without significant lesions or rashes. Psychiatric: Demonstrates good judgement and reason without abnormal affect or behaviors.  No recent labs.  Assessment: 1. Chronic Diarrhea: Increasing over the past 2 years typically 30 minutes after eating, loose stool associated with mucus, pain relieved after bowel movement, no previous work-up, colonoscopy in 2018 when patient was having these symptoms with only hemorrhoids; consider most likely IBS versus  infectious cause versus possibly microscopic colitis 2. Generalized abdominal pain  Plan: 1.  Ordered stool studies to include a GI pathogen panel and fecal pancreatic elastase 2.  Ordered labs to include a CBC, CMP and TSH 3.  Prescribed Dicyclomine 10 mg 4 times daily, 20 to 30 minutes before meals and at bedtime #120 with 3 refills 4.  Patient to follow in clinic with me in 3 to 4 weeks  Ellouise Newer, PA-C [facility] 11/24/2017, 8:43 AM

## 2017-11-24 NOTE — Patient Instructions (Addendum)
If you are age 38 or older, your body mass index should be between 23-30. Your Body mass index is 74.74 kg/m. If this is out of the aforementioned range listed, please consider follow up with your Primary Care Provider.  If you are age 51 or younger, your body mass index should be between 19-25. Your Body mass index is 74.74 kg/m. If this is out of the aformentioned range listed, please consider follow up with your Primary Care Provider.   Your provider has requested that you go to the basement level for lab work before leaving today. Press "B" on the elevator. The lab is located at the first door on the left as you exit the elevator.  We have sent the following medications to your pharmacy for you to pick up at your convenience: Dicyclomine  Follow up with me on December 22, 2017 at 8:15 am.   Thank you for choosing me and [facility].   Ellouise Newer, PA-C

## 2017-11-24 NOTE — Progress Notes (Signed)
I agree with the above note, plan 

## 2017-11-30 LAB — GASTROINTESTINAL PATHOGEN PANEL PCR
C. DIFFICILE TOX A/B, PCR: NOT DETECTED
CRYPTOSPORIDIUM, PCR: NOT DETECTED
Campylobacter, PCR: NOT DETECTED
E COLI (STEC) STX1/STX2, PCR: NOT DETECTED
E coli (ETEC) LT/ST PCR: NOT DETECTED
E coli 0157, PCR: NOT DETECTED
Giardia lamblia, PCR: NOT DETECTED
NOROVIRUS, PCR: NOT DETECTED
ROTAVIRUS, PCR: NOT DETECTED
SALMONELLA, PCR: NOT DETECTED
Shigella, PCR: NOT DETECTED

## 2017-11-30 LAB — OVA AND PARASITE EXAMINATION
CONCENTRATE RESULT:: NONE SEEN
SPECIMEN QUALITY:: ADEQUATE
TRICHROME RESULT:: NONE SEEN
VKL: 91268599

## 2017-11-30 LAB — PANCREATIC ELASTASE, FECAL: Pancreatic Elastase-1, Stool: >500 mcg/g

## 2017-12-22 ENCOUNTER — Encounter: Payer: Self-pay | Admitting: Physician Assistant

## 2017-12-22 ENCOUNTER — Ambulatory Visit (INDEPENDENT_AMBULATORY_CARE_PROVIDER_SITE_OTHER): Payer: PRIVATE HEALTH INSURANCE | Admitting: Physician Assistant

## 2017-12-22 VITALS — BP 136/90 | HR 86 | Ht 63.75 in | Wt >= 6400 oz

## 2017-12-22 DIAGNOSIS — K580 Irritable bowel syndrome with diarrhea: Secondary | ICD-10-CM

## 2017-12-22 MED ORDER — DICYCLOMINE HCL 10 MG PO CAPS: 10.0000 mg | ORAL_CAPSULE | Freq: Four times a day (QID) | ORAL | 2 refills | Status: DC

## 2017-12-22 NOTE — Progress Notes (Signed)
Chief Complaint: Follow-up chronic diarrhea  HPI:    Krista White is a 37 year old African-American female, known to Dr. Ardis Hughs for screening colonoscopy, with a past medical history as listed below including endometrial cancer status post TAH/BSO, who returns clinic today for follow-up of her chronic diarrhea.    02/14/2016 colonoscopy for minor rectal bleeding.  Dr. Ardis Hughs saw external and internal hemorrhoids and was otherwise normal, repeat recommended in 10 years.    11/24/2017 office visit with me discussed that about 30 minutes after eating her whole life she had a bowel movement.  Over the past 2 years this had worsened and become associated with abdominal cramping.  At that visit ordered stool studies to include GI pathogen panel and fecal pancreatic elastase which were normal/negative.  Labs including CBC, CMP and TSH were normal.  She was prescribed dicyclomine 10 mg 4 times a day 2030 minutes before meals and at bedtime.  All labs were negative/normal.     Today, the patient expresses that she did not start the Dicyclomine as she was not exactly sure what her diagnosis was and did not want to start a medicine without knowing what she was treating.  She has continued with symptoms which are exactly the same as before.  Patient also tells me today that her bowel movements are strongly tied to her emotions, even when she is excited she may have increased loose stool.    Social history positive for having a bad history with some of her physicians in regards to her weight and symptoms.  She was thankful today for our efforts to figure out what is going on.    Denies fever, chills, weight loss, anorexia, nausea, vomiting, heartburn, reflux, blood in her stool or symptoms that awaken her from sleep.  Past Medical History:  Diagnosis Date  . Anemia   . Diabetes mellitus without complication ([place])    pt states she was pre diabetic but is no longer. Takes no meds and does not check her blood  sugar.   . Endometrial cancer ([place])   . IBS (irritable bowel syndrome)   . Nocturia   . Obesity   . Tingling    right hand    Past Surgical History:  Procedure Laterality Date  . COLONOSCOPY WITH PROPOFOL N/A 02/14/2016   Procedure: COLONOSCOPY WITH PROPOFOL;  Surgeon: Milus Banister, MD;  Location: [facility];  Service: Endoscopy;  Laterality: N/A;  . HYSTEROSCOPY W/D&C N/A 01/30/2015   Procedure: DILATATION AND CURETTAGE /HYSTEROSCOPY;  Surgeon: Azucena Fallen, MD;  Location: [facility];  Service: Gynecology;  Laterality: N/A;  . ROBOTIC ASSISTED TOTAL HYSTERECTOMY WITH BILATERAL SALPINGO OOPHERECTOMY N/A 03/22/2015   Procedure: XI ROBOTIC ASSISTED TOTAL ABDOMINAL HYSTERECTOMY FOR UTERUS GREATER THAN [address] [place] WITH BILATERAL SALPINGO OOPHORECTOMY;  Surgeon: Everitt Amber, MD;  Location: [facility];  Service: Gynecology;  Laterality: N/A;  . TYMPANOSTOMY TUBE PLACEMENT     bilat     No current outpatient medications on file.   No current facility-administered medications for this visit.     Allergies as of 12/22/2017 - Review Complete 12/22/2017  Allergen Reaction Noted  . Asa [aspirin] Hives 01/30/2015  . Latex Itching 02/09/2012  . Pineapple  05/27/2014  . Lactase Diarrhea 05/27/2014    Family History  Problem Relation Age of Onset  . Ovarian cancer Mother   . Diabetes Father   . Heart attack Father   . Throat cancer Paternal Uncle   . Diabetes Paternal Aunt   . Diabetes  Maternal Uncle     Social History   Socioeconomic History  . Marital status: Single    Spouse name: Not on file  . Number of children: 0  . Years of education: Not on file  . Highest education level: Not on file  Occupational History    Employer: [employer]  . Financial resource strain: Not on file  . Food insecurity:    Worry: Not on file    Inability: Not on file  . Transportation needs:    Medical: Not on file    Non-medical: Not on file  Tobacco Use  . Smoking status: Never  Smoker  . Smokeless tobacco: Never Used  Substance and Sexual Activity  . Alcohol use: No    Alcohol/week: 0.0 standard drinks  . Drug use: No  . Sexual activity: Not on file  Lifestyle  . Physical activity:    Days per week: Not on file    Minutes per session: Not on file  . Stress: Not on file  Relationships  . Social connections:    Talks on phone: Not on file    Gets together: Not on file    Attends religious service: Not on file    Active member of club or organization: Not on file    Attends meetings of clubs or organizations: Not on file    Relationship status: Not on file  . Intimate partner violence:    Fear of current or ex partner: Not on file    Emotionally abused: Not on file    Physically abused: Not on file    Forced sexual activity: Not on file  Other Topics Concern  . Not on file  Social History Narrative  . Not on file    Review of Systems:    Constitutional: No weight loss, fever or chills Skin: No rash Cardiovascular: No chest pain Respiratory: No SOB  Gastrointestinal: See HPI and otherwise negative   Physical Exam:  Vital signs: Pulse 86   Ht 5' 3.75" (1.619 m)   Wt (!) 435 lb (197.3 kg)   LMP 03/22/2015 Comment: has been spotting for a while  BMI 75.25 kg/m   Constitutional:   Pleasant obese AA female appears to be in NAD, Well developed, Well nourished, alert and cooperative Respiratory: Respirations even and unlabored. Lungs clear to auscultation bilaterally.   No wheezes, crackles, or rhonchi.  Cardiovascular: Normal S1, S2. No MRG. Regular rate and rhythm. No peripheral edema, cyanosis or pallor.  Gastrointestinal:  Soft, nondistended, nontender. No rebound or guarding. Normal bowel sounds. No appreciable masses or hepatomegaly. Psychiatric: Demonstrates good judgement and reason without abnormal affect or behaviors.  RELEVANT LABS AND IMAGING: CBC    Component Value Date/Time   WBC 6.1 11/24/2017 0922   RBC 4.75 11/24/2017 0922    HGB 12.8 11/24/2017 0922   HCT 38.4 11/24/2017 0922   PLT 360.0 11/24/2017 0922   MCV 80.9 11/24/2017 0922   MCH 23.2 (L) 03/23/2015 0424   MCHC 33.3 11/24/2017 0922   RDW 13.4 11/24/2017 0922   LYMPHSABS 2.6 11/24/2017 0922   MONOABS 0.4 11/24/2017 0922   EOSABS 0.1 11/24/2017 0922   BASOSABS 0.0 11/24/2017 0922    CMP     Component Value Date/Time   NA 140 11/24/2017 0922   K 4.2 11/24/2017 0922   CL 105 11/24/2017 0922   CO2 27 11/24/2017 0922   GLUCOSE 128 (H) 11/24/2017 0922   BUN 12 11/24/2017 5885  CREATININE 0.73 11/24/2017 0922   CALCIUM 9.1 11/24/2017 0922   PROT 7.9 11/24/2017 0922   ALBUMIN 4.1 11/24/2017 0922   AST 18 11/24/2017 0922   ALT 20 11/24/2017 0922   ALKPHOS 100 11/24/2017 0922   BILITOT 0.3 11/24/2017 0922   GFRNONAA >60 03/23/2015 0424   GFRAA >60 03/23/2015 0424    Assessment: 1.  IBS-D: Patient with bowel movements about 30 minutes after eating associated with abdominal cramping, this has been increasing over the past 2 years, recent colonoscopy at the end of 2018 was normal other than hemorrhoids, labs including CBC, CMP and TSH were normal, stool studies including ova and parasites, GI pathogen panel and pancreatic elastase were all negative/normal, strong connection between stools and emotions; patient has been diagnosed with IBS-D  Plan: 1.  Discussed Dicyclomine in detail today with the patient.  Discussed that this can decrease intestinal spasm and help with diarrhea.  Re-prescribed Dicyclomine 10 mg 4 times daily, 20-30 minutes before meals and at bedtime #120 with 3 refills.  Sent this to a different pharmacy for the patient. 2.  Would recommend the patient start a daily probiotic such as Align once daily for the next 2 months.  Did provide her with a coupon. 3.  Encouraged the patient to increase water intake to the 6-8 eight ounce glasses of water per day. 4.  Patient to follow in clinic with me in 2-3 months or sooner if necessary.  At  that time can discuss titrating down on Dicyclomine versus increasing.  Krista Newer, PA-C [facility] 12/22/2017, 8:23 AM  Cc: No ref. provider found

## 2017-12-22 NOTE — Patient Instructions (Addendum)
Please follow up with Ellouise Newer, PA-C in 2 months. Call to schedule this appointment.   We have given you a prescription of Bentyl to take to your pharmacy.  Please purchase the following medications over the counter and take as directed: Align-1 capsule by mouth once daily x 2 months  Make sure to drink plenty of water! You should be drinking at least 6-8 8 ounce glasses per day!  Irritable Bowel Syndrome, Adult Irritable bowel syndrome (IBS) is not one specific disease. It is a group of symptoms that affects the organs responsible for digestion (gastrointestinal or GI tract). To regulate how your GI tract works, your body sends signals back and forth between your intestines and your brain. If you have IBS, there may be a problem with these signals. As a result, your GI tract does not function normally. Your intestines may become more sensitive and overreact to certain things. This is especially true when you eat certain foods or when you are under stress. There are four types of IBS. These may be determined based on the consistency of your stool:  IBS with diarrhea.  IBS with constipation.  Mixed IBS.  Unsubtyped IBS.  It is important to know which type of IBS you have. Some treatments are more likely to be helpful for certain types of IBS. What are the causes? The exact cause of IBS is not known. What increases the risk? You may have a higher risk of IBS if:  You are a woman.  You are younger than 38 years old.  You have a family history of IBS.  You have mental health problems.  You have had bacterial infection of your GI tract.  What are the signs or symptoms? Symptoms of IBS vary from person to person. The main symptom is abdominal pain or discomfort. Additional symptoms usually include one or more of the following:  Diarrhea, constipation, or both.  Abdominal swelling or bloating.  Feeling full or sick after eating a small or regular-size meal.  Frequent  gas.  Mucus in the stool.  A feeling of having more stool left after a bowel movement.  Symptoms tend to come and go. They may be associated with stress, psychiatric conditions, or nothing at all. How is this diagnosed? There is no specific test to diagnose IBS. Your health care provider will make a diagnosis based on a physical exam, medical history, and your symptoms. You may have other tests to rule out other conditions that may be causing your symptoms. These may include:  Blood tests.  X-rays.  CT scan.  Endoscopy and colonoscopy. This is a test in which your GI tract is viewed with a long, thin, flexible tube.  How is this treated? There is no cure for IBS, but treatment can help relieve symptoms. IBS treatment often includes:  Changes to your diet, such as: ? Eating more fiber. ? Avoiding foods that cause symptoms. ? Drinking more water. ? Eating regular, medium-sized portioned meals.  Medicines. These may include: ? Fiber supplements if you have constipation. ? Medicine to control diarrhea (antidiarrheal medicines). ? Medicine to help control muscle spasms in your GI tract (antispasmodic medicines). ? Medicines to help with any mental health issues, such as antidepressants or tranquilizers.  Therapy. ? Talk therapy may help with anxiety, depression, or other mental health issues that can make IBS symptoms worse.  Stress reduction. ? Managing your stress can help keep symptoms under control.  Follow these instructions at home:  Take medicines only  as directed by your health care provider.  Eat a healthy diet. ? Avoid foods and drinks with added sugar. ? Include more whole grains, fruits, and vegetables gradually into your diet. This may be especially helpful if you have IBS with constipation. ? Avoid any foods and drinks that make your symptoms worse. These may include dairy products and caffeinated or carbonated drinks. ? Do not eat large meals. ? Drink enough  fluid to keep your urine clear or pale yellow.  Exercise regularly. Ask your health care provider for recommendations of good activities for you.  Keep all follow-up visits as directed by your health care provider. This is important. Contact a health care provider if:  You have constant pain.  You have trouble or pain with swallowing.  You have worsening diarrhea. Get help right away if:  You have severe and worsening abdominal pain.  You have diarrhea and: ? You have a rash, stiff neck, or severe headache. ? You are irritable, sleepy, or difficult to awaken. ? You are weak, dizzy, or extremely thirsty.  You have bright red blood in your stool or you have black tarry stools.  You have unusual abdominal swelling that is painful.  You vomit continuously.  You vomit blood (hematemesis).  You have both abdominal pain and a fever. This information is not intended to replace advice given to you by your health care provider. Make sure you discuss any questions you have with your health care provider. Document Released: 01/20/2005 Document Revised: 06/22/2015 Document Reviewed: 10/07/2013 Elsevier Interactive Patient Education  2018 [employer].

## 2017-12-22 NOTE — Progress Notes (Signed)
I agree with the above note, plan 

## 2018-07-09 ENCOUNTER — Encounter (HOSPITAL_COMMUNITY): Payer: Self-pay

## 2018-07-09 ENCOUNTER — Other Ambulatory Visit: Payer: Self-pay

## 2018-07-09 ENCOUNTER — Emergency Department (HOSPITAL_COMMUNITY)
Admission: EM | Admit: 2018-07-09 | Discharge: 2018-07-09 | Disposition: A | Discharge status: Home / Self Care | Payer: PRIVATE HEALTH INSURANCE | Attending: Emergency Medicine | Admitting: Emergency Medicine

## 2018-07-09 DIAGNOSIS — L02212 Cutaneous abscess of back [any part, except buttock]: Secondary | ICD-10-CM | POA: Insufficient documentation

## 2018-07-09 DIAGNOSIS — L0291 Cutaneous abscess, unspecified: Secondary | ICD-10-CM

## 2018-07-09 DIAGNOSIS — E119 Type 2 diabetes mellitus without complications: Secondary | ICD-10-CM | POA: Insufficient documentation

## 2018-07-09 MED ORDER — ACETAMINOPHEN 325 MG PO TABS
650.0000 mg | ORAL_TABLET | Freq: Once | ORAL | Status: AC
  Administered 2018-07-09: 650 mg via ORAL
  Filled 2018-07-09: qty 2

## 2018-07-09 MED ORDER — LIDOCAINE-EPINEPHRINE (PF) 2 %-1:200000 IJ SOLN
10.0000 mL | Freq: Once | INTRAMUSCULAR | Status: AC
  Administered 2018-07-09: 10 mL
  Filled 2018-07-09: qty 10

## 2018-07-09 MED ORDER — CEPHALEXIN 500 MG PO CAPS: 500.0000 mg | ORAL_CAPSULE | Freq: Four times a day (QID) | ORAL | 0 refills | Status: DC

## 2018-07-09 NOTE — ED Triage Notes (Signed)
Pt states that she has a boil on her lower back, above her buttocks. Pt first noticed it Monday.

## 2018-07-09 NOTE — ED Provider Notes (Signed)
[facility] DEPT Provider Note   CSN: [id number] Arrival date & time: 07/09/18  1536    History   Chief Complaint Chief Complaint  Patient presents with  . Abscess    HPI Krista White is a 38 y.o. female with PMHx diabetes who presents to the ED complaining of an abscess to her lower back that has been present for the past 4 days. Pt reports she felt something underneath her skin but did not notice the actual abscess until today. She has been applying warm compresses to the area without relief. Pt has never had an abscess before. Denies fever, chills, drainage.        Past Medical History:  Diagnosis Date  . Anemia   . Diabetes mellitus without complication ([place])    pt states she was pre diabetic but is no longer. Takes no meds and does not check her blood sugar.   . Endometrial cancer ([place])   . IBS (irritable bowel syndrome)   . Nocturia   . Obesity   . Tingling    right hand    Patient Active Problem List   Diagnosis Date Noted  . RLQ abdominal pain   . Hemorrhoids   . Abdominal pain 10/24/2015  . Rectal bleeding 10/24/2015  . Endometrial cancer ([place]) 02/21/2015  . Morbid obesity with BMI of 70 and over, adult ([place]) 02/21/2015    Past Surgical History:  Procedure Laterality Date  . COLONOSCOPY WITH PROPOFOL N/A 02/14/2016   Procedure: COLONOSCOPY WITH PROPOFOL;  Surgeon: Milus Banister, MD;  Location: [facility];  Service: Endoscopy;  Laterality: N/A;  . HYSTEROSCOPY W/D&C N/A 01/30/2015   Procedure: DILATATION AND CURETTAGE /HYSTEROSCOPY;  Surgeon: Azucena Fallen, MD;  Location: [facility];  Service: Gynecology;  Laterality: N/A;  . ROBOTIC ASSISTED TOTAL HYSTERECTOMY WITH BILATERAL SALPINGO OOPHERECTOMY N/A 03/22/2015   Procedure: XI ROBOTIC ASSISTED TOTAL ABDOMINAL HYSTERECTOMY FOR UTERUS GREATER THAN [address] [place] WITH BILATERAL SALPINGO OOPHORECTOMY;  Surgeon: Everitt Amber, MD;  Location: [facility];  Service: Gynecology;  Laterality: N/A;   . TYMPANOSTOMY TUBE PLACEMENT     bilat      OB History   No obstetric history on file.      Home Medications    Prior to Admission medications   Medication Sig Start Date End Date Taking? Authorizing Provider  cephALEXin (KEFLEX) 500 MG capsule Take 1 capsule (500 mg total) by mouth 4 (four) times daily. 07/09/18   Alroy Bailiff, Arfa Lamarca, PA-C  dicyclomine (BENTYL) 10 MG capsule Take 1 capsule (10 mg total) by mouth 4 (four) times daily. Take 20-30 minutes before meals and at bedtime. 12/22/17   Levin Erp, PA    Family History Family History  Problem Relation Age of Onset  . Ovarian cancer Mother   . Diabetes Father   . Heart attack Father   . Throat cancer Paternal Uncle   . Diabetes Paternal Aunt   . Diabetes Maternal Uncle     Social History Social History   Tobacco Use  . Smoking status: Never Smoker  . Smokeless tobacco: Never Used  Substance Use Topics  . Alcohol use: No    Alcohol/week: 0.0 standard drinks  . Drug use: No     Allergies   Asa [aspirin]; Latex; Pineapple; and Lactase   Review of Systems Review of Systems  Constitutional: Negative for chills and fever.  Skin: Positive for color change.     Physical Exam Updated Vital Signs BP (!) 151/96 (BP Location:  Right Arm)   Pulse (!) 125   Temp 98.8 F (37.1 C) (Oral)   Resp 20   Ht 5\' 4"  (1.626 m)   Wt (!) 190.5 kg   LMP 03/22/2015 Comment: has been spotting for a while  SpO2 95%   BMI 72.09 kg/m   Physical Exam Vitals signs and nursing note reviewed.  Constitutional:      Appearance: She is not ill-appearing.  HENT:     Head: Normocephalic and atraumatic.  Eyes:     Conjunctiva/sclera: Conjunctivae normal.  Cardiovascular:     Rate and Rhythm: Normal rate and regular rhythm.  Pulmonary:     Effort: Pulmonary effort is normal.     Breath sounds: Normal breath sounds.  Skin:    General: Skin is warm and dry.     Coloration: Skin is not jaundiced.     Comments: 2 x 2 cm  area of fluctuance surrounded by a larger area of induration to mid right back underneath skin fold; no overlying cellulitis  Neurological:     Mental Status: She is alert.      ED Treatments / Results  Labs (all labs ordered are listed, but only abnormal results are displayed) Labs Reviewed - No data to display  EKG None  Radiology No results found.  Procedures .Marland KitchenIncision and Drainage Date/Time: 07/09/2018 4:55 PM Performed by: Eustaquio Maize, PA-C Authorized by: Eustaquio Maize, PA-C   Consent:    Consent obtained:  Verbal   Consent given by:  Patient   Risks discussed:  Bleeding, incomplete drainage, pain and damage to other organs   Alternatives discussed:  No treatment Universal protocol:    Procedure explained and questions answered to patient or proxy's satisfaction: yes     Relevant documents present and verified: yes     Patient identity confirmed:  Verbally with patient Location:    Type:  Abscess   Size:  4 x 4 cm   Location:  Trunk   Trunk location:  Back Pre-procedure details:    Skin preparation:  Betadine Anesthesia (see MAR for exact dosages):    Anesthesia method:  Local infiltration   Local anesthetic:  Lidocaine 1% WITH epi and lidocaine 2% WITH epi Procedure type:    Complexity:  Complex Procedure details:    Needle aspiration: no     Incision types:  Single straight   Incision depth:  Subcutaneous   Scalpel blade:  11   Wound management:  Probed and deloculated, irrigated with saline and extensive cleaning   Drainage:  Purulent and bloody   Drainage amount:  Moderate   Packing materials:  1/2 in gauze Post-procedure details:    Patient tolerance of procedure:  Tolerated well, no immediate complications   (including critical care time)  Medications Ordered in ED Medications  lidocaine-EPINEPHrine (XYLOCAINE W/EPI) 2 %-1:200000 (PF) injection 10 mL (10 mLs Infiltration Given by Other 07/09/18 1619)  acetaminophen (TYLENOL) tablet 650 mg  (650 mg Oral Given 07/09/18 1619)     Initial Impression / Assessment and Plan / ED Course  I have reviewed the triage vital signs and the nursing notes.  Pertinent labs & imaging results that were available during my care of the patient were reviewed by me and considered in my medical decision making (see chart for details).    Pt is a 38 year old female who presents to the ED today for an abscess to her back; no drainage noted. No fever or chills. Pt has never had an  abscess in the past. Will perform I&D today as there is a pocket of fluctuance ontop area of induration. Pt mildly tachycardic on presentation; initially in the 120's but decreased to low 100's during examination; pt states she is nervous and in mild amount of pain and believes this is due to her tachycardia.   Pt tolerated I&D well; moderate amount of drainage achieved although still some induration to the area that was unable to be deloculated. 1/2 inch iodoform packing placed. Tachycardia improved while in the ED; with patient sitting still after procedure eating ice chips it went down to 90's. Will discharge home with antibiotics and PCP follow up. Pt does not have a PCP at this time; will discharge with resource to [place].        Final Clinical Impressions(s) / ED Diagnoses   Final diagnoses:  Abscess    ED Discharge Orders         Ordered    cephALEXin (KEFLEX) 500 MG capsule  4 times daily     07/09/18 1701           Eustaquio Maize, PA-C 07/09/18 1711    Lacretia Leigh, MD 07/10/18 1750

## 2018-07-09 NOTE — Discharge Instructions (Signed)
You were seen in the ED today for an abscess that was drained; a small amount of packing was placed; this should fall out on its own but you can remove after 2 days. Please take antibiotics as prescribed. Please follow up with [facility] for your primary care needs.

## 2018-07-28 NOTE — Progress Notes (Signed)
Patient ID: Krista White, female   DOB: Dec 24, 1980, 38 y.o.   MRN: [id number]  Virtual Visit via Telephone Note  I connected with Krista White on 07/29/18 at  8:50 AM EDT by telephone and verified that I am speaking with the correct person using two identifiers.   I discussed the limitations, risks, security and privacy concerns of performing an evaluation and management service by telephone and the availability of in person appointments. I also discussed with the patient that there may be a patient responsible charge related to this service. The patient expressed understanding and agreed to proceed.  Patient location:  home My Location:  [facility] office Persons on the call:  Me and the patient   History of Present Illness: After being seen in the ED 07/09/2018 for abscess and I&D. She was prescribed Cephalexin.  The abscess has resolved.  No fever.   Her bf has tested + for herpes in the past and she wants to get checked for STD.    She also wants to get checked for DM.  She has had hyperglycemia in the past.  She has been having some blurry vision with reading.  No polyuria/polydipsia.    From ED A/P: Pt is a 38 year old female who presents to the ED today for an abscess to her back; no drainage noted. No fever or chills. Pt has never had an abscess in the past. Will perform I&D today as there is a pocket of fluctuance ontop area of induration. Pt mildly tachycardic on presentation; initially in the 120's but decreased to low 100's during examination; pt states she is nervous and in mild amount of pain and believes this is due to her tachycardia.   Pt tolerated I&D well; moderate amount of drainage achieved although still some induration to the area that was unable to be deloculated. 1/2 inch iodoform packing placed. Tachycardia improved while in the ED; with patient sitting still after procedure eating ice chips it went down to 90's. Will discharge home with antibiotics and PCP follow up. Pt  does not have a PCP at this time; will discharge with resource to [place].    Observations/Objective:  A&Ox3   Assessment and Plan: 1. Abscess resolved  2. Encounter for examination following treatment at hospital Doing well  3. History of hyperglycemia Will schedule appt for next week for pap/STD check/ fasting bloodwork    Follow Up Instructions: 1 week appt   I discussed the assessment and treatment plan with the patient. The patient was provided an opportunity to ask questions and all were answered. The patient agreed with the plan and demonstrated an understanding of the instructions.   The patient was advised to call back or seek an in-person evaluation if the symptoms worsen or if the condition fails to improve as anticipated.  I provided 12 minutes of non-face-to-face time during this encounter.   Freeman Caldron, PA-C

## 2018-07-29 ENCOUNTER — Other Ambulatory Visit: Payer: Self-pay

## 2018-07-29 ENCOUNTER — Ambulatory Visit: Payer: PRIVATE HEALTH INSURANCE | Attending: Family Medicine | Admitting: Physician Assistant

## 2018-07-29 DIAGNOSIS — Z8639 Personal history of other endocrine, nutritional and metabolic disease: Secondary | ICD-10-CM

## 2018-07-29 DIAGNOSIS — L0291 Cutaneous abscess, unspecified: Secondary | ICD-10-CM

## 2018-07-29 DIAGNOSIS — H538 Other visual disturbances: Secondary | ICD-10-CM | POA: Diagnosis not present

## 2018-07-29 DIAGNOSIS — L02212 Cutaneous abscess of back [any part, except buttock]: Secondary | ICD-10-CM | POA: Diagnosis not present

## 2018-07-29 DIAGNOSIS — Z113 Encounter for screening for infections with a predominantly sexual mode of transmission: Secondary | ICD-10-CM | POA: Diagnosis not present

## 2018-07-29 DIAGNOSIS — Z09 Encounter for follow-up examination after completed treatment for conditions other than malignant neoplasm: Secondary | ICD-10-CM

## 2018-07-29 NOTE — Progress Notes (Signed)
Patient wants to know if she have diabetes.

## 2018-08-05 ENCOUNTER — Ambulatory Visit: Payer: PRIVATE HEALTH INSURANCE | Admitting: Physician Assistant

## 2018-08-05 ENCOUNTER — Other Ambulatory Visit: Payer: Self-pay

## 2018-08-05 ENCOUNTER — Ambulatory Visit: Payer: Self-pay | Attending: Physician Assistant | Admitting: Physician Assistant

## 2018-08-05 VITALS — BP 132/82 | HR 87 | Temp 99.0°F | Ht 64.0 in | Wt >= 6400 oz

## 2018-08-05 DIAGNOSIS — Z202 Contact with and (suspected) exposure to infections with a predominantly sexual mode of transmission: Secondary | ICD-10-CM

## 2018-08-05 DIAGNOSIS — Z6841 Body Mass Index (BMI) 40.0 and over, adult: Secondary | ICD-10-CM

## 2018-08-05 DIAGNOSIS — R739 Hyperglycemia, unspecified: Secondary | ICD-10-CM

## 2018-08-05 MED ORDER — VALACYCLOVIR HCL 1 G PO TABS: 1000.0000 mg | ORAL_TABLET | Freq: Three times a day (TID) | ORAL | 0 refills | Status: DC

## 2018-08-05 MED FILL — valACYclovir HCL 1 GM TABS: 1 | 7 days supply | Qty: 21 | Fill #0

## 2018-08-05 NOTE — Patient Instructions (Signed)

## 2018-08-05 NOTE — Progress Notes (Signed)
Pt. Is here for a pap smear.  Pt. Would like to have her boil look at on her vaginal.

## 2018-08-05 NOTE — Progress Notes (Signed)
Patient ID: Krista White, female   DOB: 03-27-1980, 38 y.o.   MRN: [id number]   Krista White, is a 38 y.o. female  TSV:[id number]  [id number]  DOB - 1980/04/30  Subjective:  Chief Complaint and HPI: Krista White is a 38 y.o. female here today to establish care and for STD check.  Her Ex BF had herpes.  She started having some mildly tender lesions in the vaginal region in early June.  She denies fever or other symptoms.  See last note.    S/p total hysterectomy 2017 satge 2 ovarian CA.    Family history:  DM and htn  ROS:   Constitutional:  No f/c, No night sweats, No unexplained weight loss. EENT:  No vision changes, No blurry vision, No hearing changes. No mouth, throat, or ear problems.  Respiratory: No cough, No SOB Cardiac: No CP, no palpitations GI:  No abd pain, No N/V/D. GU: No Urinary s/sx Musculoskeletal: No joint pain Neuro: No headache, no dizziness, no motor weakness.  Skin: No rash Endocrine:  No polydipsia. No polyuria.  Psych: Denies SI/HI  No problems updated.  ALLERGIES: Allergies  Allergen Reactions  . Asa [Aspirin] Hives  . Latex Itching  . Pineapple     Mouth itches  . Lactase Diarrhea    PAST MEDICAL HISTORY: Past Medical History:  Diagnosis Date  . Anemia   . Diabetes mellitus without complication ([place])    pt states she was pre diabetic but is no longer. Takes no meds and does not check her blood sugar.   . Endometrial cancer ([place])   . IBS (irritable bowel syndrome)   . Nocturia   . Obesity   . Tingling    right hand    MEDICATIONS AT HOME: Prior to Admission medications   Medication Sig Start Date End Date Taking? Authorizing Provider  cephALEXin (KEFLEX) 500 MG capsule Take 1 capsule (500 mg total) by mouth 4 (four) times daily. Patient not taking: Reported on 07/29/2018 07/09/18   Eustaquio Maize, PA-C  dicyclomine (BENTYL) 10 MG capsule Take 1 capsule (10 mg total) by mouth 4 (four) times daily. Take 20-30 minutes before meals  and at bedtime. Patient not taking: Reported on 07/29/2018 12/22/17   Levin Erp, PA  valACYclovir (VALTREX) 1000 MG tablet Take 1 tablet (1,000 mg total) by mouth 3 (three) times daily. 08/05/18   Argentina Donovan, PA-C     Objective:  EXAM:   Vitals:   08/05/18 0852  BP: 132/82  Pulse: 87  Temp: 99 F (37.2 C)  TempSrc: Oral  SpO2: 97%  Weight: (!) 413 lb (187.3 kg)  Height: 5\' 4"  (1.626 m)    General appearance : A&OX3. NAD. Non-toxic-appearing;  Morbidly obese HEENT: Atraumatic and Normocephalic.  PERRLA. EOM intact.   Chest/Lungs:  Breathing-non-labored, Good air entry bilaterally, breath sounds normal without rales, rhonchi, or wheezing  CVS: S1 S2 regular, no murmurs, gallops, rubs  GU:  Labia with a few scattered vesicles that are tender.  Speculum inserted.  Mucosa appears healthy, swabs taken.   Neurology:  CN II-XII grossly intact, Non focal.   Psych:  TP linear. J/I WNL. Normal speech. Appropriate eye contact and affect.  Skin:  No Rash  Data Review Lab Results  Component Value Date   HGBA1C 6.6 (H) 03/15/2015     Assessment & Plan   1. STD exposure Suspicious for herpes;  Will cover - HSV(herpes simplex vrs) 1+2 ab-IgG - HIV antibody (with reflex) - RPR -  Cytology - PAP([place]) - Cervicovaginal ancillary only Valtrex 1g tid X7days Condoms always.  Consider ongoing valtrex if testing is positive  2. Hyperglycemia I have had a lengthy discussion and provided education about insulin resistance and the intake of too much sugar/refined carbohydrates.  I have advised the patient to work at a goal of eliminating sugary drinks, candy, desserts, sweets, refined sugars, processed foods, and white carbohydrates.  The patient expresses understanding.  - Hemoglobin A1c  3. Morbid obesity with BMI of 70 and over, adult ([place]) Healthy diet and exercise encouraged.    Patient have been counseled extensively about nutrition and exercise  Return in  about 6 weeks (around 09/16/2018) for assign PCP.  The patient was given clear instructions to go to ER or return to medical center if symptoms don't improve, worsen or new problems develop. The patient verbalized understanding. The patient was told to call to get lab results if they haven't heard anything in the next week.     Freeman Caldron, PA-C [facility] [place], [place]   08/05/2018, 9:16 AM

## 2018-08-06 LAB — HSV(HERPES SIMPLEX VRS) I + II AB-IGG
HSV 1 Glycoprotein G Ab, IgG: 17.9 index — ABNORMAL HIGH (ref 0.00–0.90)
HSV 2 IgG, Type Spec: 17.5 index — ABNORMAL HIGH (ref 0.00–0.90)

## 2018-08-06 LAB — HEMOGLOBIN A1C
Est. average glucose Bld gHb Est-mCnc: 329 mg/dL
Hgb A1c MFr Bld: 13.1 % — ABNORMAL HIGH (ref 4.8–5.6)

## 2018-08-06 LAB — HIV ANTIBODY (ROUTINE TESTING W REFLEX): HIV Screen 4th Generation wRfx: NONREACTIVE

## 2018-08-06 LAB — RPR: RPR Ser Ql: NONREACTIVE

## 2018-08-08 ENCOUNTER — Other Ambulatory Visit: Payer: Self-pay | Admitting: Physician Assistant

## 2018-08-08 DIAGNOSIS — E1165 Type 2 diabetes mellitus with hyperglycemia: Secondary | ICD-10-CM

## 2018-08-08 DIAGNOSIS — B009 Herpesviral infection, unspecified: Secondary | ICD-10-CM

## 2018-08-08 MED ORDER — TRUE METRIX METER W/DEVICE KIT: 1.0000 | PACK | Freq: Two times a day (BID) | 0 refills | Status: DC

## 2018-08-08 MED ORDER — TRUEPLUS LANCETS 28G MISC: 1.0000 | Freq: Two times a day (BID) | 1 refills | Status: DC

## 2018-08-08 MED ORDER — METFORMIN HCL 1000 MG PO TABS: ORAL_TABLET | ORAL | 3 refills | Status: DC

## 2018-08-08 MED ORDER — VALACYCLOVIR HCL 1 G PO TABS: 1000.0000 mg | ORAL_TABLET | Freq: Every day | ORAL | 3 refills | Status: DC

## 2018-08-08 MED ORDER — TRUE METRIX BLOOD GLUCOSE TEST VI STRP: ORAL_STRIP | 12 refills | Status: DC

## 2018-08-09 ENCOUNTER — Telehealth: Payer: Self-pay | Admitting: General Practice

## 2018-08-09 LAB — CERVICOVAGINAL ANCILLARY ONLY
Bacterial vaginitis: NEGATIVE
Candida vaginitis: NEGATIVE
Chlamydia: NEGATIVE
Neisseria Gonorrhea: NEGATIVE
Trichomonas: NEGATIVE

## 2018-08-09 MED FILL — TRUEplus LANCETS 28G MISC: 50 days supply | Qty: 100 | Fill #0

## 2018-08-09 MED FILL — metFORMIN HCL 1000 MG TABS: 1000 | 31 days supply | Qty: 61 | Fill #0

## 2018-08-09 MED FILL — TRUE METRIX TEST STRIP: 30 days supply | Qty: 100 | Fill #0

## 2018-08-09 MED FILL — valACYclovir HCL 1 GM TABS: 1 | 30 days supply | Qty: 30 | Fill #0

## 2018-08-09 MED FILL — !TRUE METRIX BLOOD GLUCOSE: 30 days supply | Qty: 1 | Fill #0

## 2018-08-09 NOTE — Telephone Encounter (Signed)
Patient called to check on the status of her results. Please follow up.

## 2018-08-09 NOTE — Telephone Encounter (Signed)
Pt name and DOB verified. Patient aware of results and result note per Freeman Caldron PA-C.  Pt was not able to discuss fully because she was at work and did not get off work until 6:30.  Informed patient hat I would call her back on tomorrow to discuss results and follow up.  Patient verbalized that would be fine.

## 2018-08-10 NOTE — Telephone Encounter (Signed)
Left message on voicemail to return call.

## 2018-08-11 LAB — CYTOLOGY - PAP
Diagnosis: NEGATIVE
HPV: NOT DETECTED

## 2018-08-12 LAB — CERVICOVAGINAL ANCILLARY ONLY: Herpes: NEGATIVE

## 2018-08-26 ENCOUNTER — Ambulatory Visit: Payer: Self-pay | Attending: Family Medicine | Admitting: Pharmacist

## 2018-08-26 ENCOUNTER — Other Ambulatory Visit: Payer: Self-pay

## 2018-08-26 ENCOUNTER — Encounter: Payer: Self-pay | Admitting: Pharmacist

## 2018-08-26 DIAGNOSIS — Z7189 Other specified counseling: Secondary | ICD-10-CM

## 2018-08-26 NOTE — Progress Notes (Signed)
Patient was educated on the use of the True Metrix blood glucose meter. Reviewed necessary supplies and operation of the meter. Also reviewed goal blood glucose levels. Patient was able to demonstrate use. All questions and concerns were addressed.   

## 2018-09-16 ENCOUNTER — Other Ambulatory Visit: Payer: Self-pay

## 2018-09-16 ENCOUNTER — Ambulatory Visit: Payer: Self-pay | Attending: Family Medicine | Admitting: Family Medicine

## 2018-09-16 ENCOUNTER — Encounter: Payer: Self-pay | Admitting: Family Medicine

## 2018-09-16 DIAGNOSIS — E1165 Type 2 diabetes mellitus with hyperglycemia: Secondary | ICD-10-CM

## 2018-09-16 DIAGNOSIS — K580 Irritable bowel syndrome with diarrhea: Secondary | ICD-10-CM

## 2018-09-16 MED ORDER — GLIPIZIDE ER 2.5 MG PO TB24: 2.5000 mg | ORAL_TABLET | Freq: Every day | ORAL | 2 refills | Status: DC

## 2018-09-16 NOTE — Progress Notes (Signed)
Virtual Visit via Telephone Note  I connected with Krista White on 09/16/18 at  3:30 PM EDT by telephone and verified that I am speaking with the correct person using two identifiers.   I discussed the limitations, risks, security and privacy concerns of performing an evaluation and management service by telephone and the availability of in person appointments. I also discussed with the patient that there may be a patient responsible charge related to this service. The patient expressed understanding and agreed to proceed.  Patient Location: Home Provider Location: [facility] Office Others participating in call: call initiated by Emilio Aspen, RMA who then transferred the call   History of Present Illness:      38 yo female with recently diagnosed diabetes who states that she diagnosed in the past with IBS-diarrhea predominant. She states that she has only checked her blood sugar about 4 times since she was diagnosed. She reports that she was diagnosed in the past with diarrhea predominant irritable bowel syndrome.  Patient states that the number of times that she has to get to the restroom per day greatly varies depending on her level of stress.  She also has had increased urinary frequency since being diagnosed with diabetes.  She states that she needs paperwork completed allowing her to have more frequent visits to the restroom.  She currently has two breaks per day that are 15 minutes long and she has a 30-minute break for lunch.  She states that she usually needs at least 2 additional 15-minute breaks in order to go to the restroom.  She states that her workday has been extended from 8 hours to 10 hours which is also because the need for additional bathroom breaks during her workday.        Patient reports that she is having some mild increased urinary frequency and mild increased thirst.  She is taking the metformin that was prescribed for her diabetes but is not taking the full thousand  milligram tablet due to issues with increased stomach upset and diarrhea.  She is now taking half a tablet twice daily.  When she has checked her blood sugars they have generally over 200.         On additional review of systems, patient denies any headaches or dizziness, no chest pain or palpitations, no abdominal pain-no nausea/vomiting/constipation.  She does have some occasional abdominal cramping.  She denies any blood in the stool and no black stools.  No peripheral edema.  No current muscle or joint pain.  She does have some fatigue.  Past Medical History:  Diagnosis Date  . Anemia   . Diabetes mellitus without complication ([place])    pt states she was pre diabetic but is no longer. Takes no meds and does not check her blood sugar.   . Endometrial cancer ([place])   . IBS (irritable bowel syndrome)   . Nocturia   . Obesity   . Tingling    right hand    Past Surgical History:  Procedure Laterality Date  . COLONOSCOPY WITH PROPOFOL N/A 02/14/2016   Procedure: COLONOSCOPY WITH PROPOFOL;  Surgeon: Milus Banister, MD;  Location: [facility];  Service: Endoscopy;  Laterality: N/A;  . HYSTEROSCOPY W/D&C N/A 01/30/2015   Procedure: DILATATION AND CURETTAGE /HYSTEROSCOPY;  Surgeon: Azucena Fallen, MD;  Location: [facility];  Service: Gynecology;  Laterality: N/A;  . ROBOTIC ASSISTED TOTAL HYSTERECTOMY WITH BILATERAL SALPINGO OOPHERECTOMY N/A 03/22/2015   Procedure: XI ROBOTIC ASSISTED TOTAL ABDOMINAL HYSTERECTOMY FOR UTERUS GREATER  THAN 250 GMS WITH BILATERAL SALPINGO OOPHORECTOMY;  Surgeon: Everitt Amber, MD;  Location: [facility];  Service: Gynecology;  Laterality: N/A;  . TYMPANOSTOMY TUBE PLACEMENT     bilat     Family History  Problem Relation Age of Onset  . Ovarian cancer Mother   . Diabetes Father   . Heart attack Father   . Throat cancer Paternal Uncle   . Diabetes Paternal Aunt   . Diabetes Maternal Uncle     Social History   Tobacco Use  . Smoking status: Never Smoker  . Smokeless  tobacco: Never Used  Substance Use Topics  . Alcohol use: No    Alcohol/week: 0.0 standard drinks  . Drug use: No     Allergies  Allergen Reactions  . Asa [Aspirin] Hives  . Latex Itching  . Pineapple     Mouth itches  . Lactase Diarrhea       Observations/Objective: No vital signs or physical exam conducted as visit was done via telephone  Assessment and Plan: 1. Type 2 diabetes mellitus with hyperglycemia, without long-term current use of insulin ([place]) Patient with hemoglobin A1c on 08/05/2018 that was elevated at 13.1.  Patient was placed on metformin at thousand milligrams twice daily but states that she has difficulty tolerating the medication secondary to GI upset and diarrhea and is currently taking 500 mg twice daily.  Patient states that she has not checked her blood sugars on a regular basis but when she has checked, blood sugars have remained elevated.  Patient has been asked to come into the office to have CMP in follow-up of her diabetes and diarrhea predominant IBS.  We will add glipizide 2.5 mg once daily to help lower her blood sugars.  Patient should monitor her blood sugars at home so that medication can be properly titrated to help improve control of her diabetes. - Comprehensive metabolic panel; Future - glipiZIDE (GLUCOTROL XL) 2.5 MG 24 hr tablet; Take 1 tablet (2.5 mg total) by mouth daily with breakfast.  Dispense: 30 tablet; Refill: 2  2. Irritable bowel syndrome with diarrhea Patient will return to office for comprehensive metabolic panel in follow-up of her IBS with diarrhea to see if she has any electrolyte or liver abnormality related to her chronic diarrhea.  Patient will also drop off FMLA paperwork as she states that she needs increased number of bathroom breaks during her workday secondary to her recurrent diarrhea as well as increased urinary frequency related to diabetes. - Comprehensive metabolic panel; Future  Follow Up Instructions:Return for  2 month  follow-up of diabetes.    I discussed the assessment and treatment plan with the patient. The patient was provided an opportunity to ask questions and all were answered. The patient agreed with the plan and demonstrated an understanding of the instructions.   The patient was advised to call back or seek an in-person evaluation if the symptoms worsen or if the condition fails to improve as anticipated.  I provided 13 minutes of non-face-to-face time during this encounter.   Antony Blackbird, MD

## 2018-09-16 NOTE — Progress Notes (Signed)
Est PCP,   Per pt she would like for PCP to complete form for her job in ref. To her using the restroom so much. Per pt due to her medications and being dx from GI with IBS.

## 2018-09-17 ENCOUNTER — Ambulatory Visit: Payer: Self-pay | Attending: Family Medicine

## 2018-09-17 ENCOUNTER — Encounter: Payer: Self-pay | Admitting: Family Medicine

## 2018-09-17 DIAGNOSIS — E1165 Type 2 diabetes mellitus with hyperglycemia: Secondary | ICD-10-CM

## 2018-09-17 DIAGNOSIS — K580 Irritable bowel syndrome with diarrhea: Secondary | ICD-10-CM

## 2018-09-17 NOTE — Telephone Encounter (Signed)
Due to provider being out of the office and patient needing her forms to be completed no later then 09-21-2018 and provider not going to be in the office until 09-22-18, Staff called Krista White at [phone] ext [phone] and [facility] requesting an extension for patient forms to be submitted. Office number was provided on voicemail and staff name was also provided on voicemail. Staff called called patient and informed her with this information. Patient dropped off the forms that needed to be completed and staff personally placed it in provider's box to be completed when provider returns to office. Message will also be sent to provider.

## 2018-09-18 LAB — COMPREHENSIVE METABOLIC PANEL WITH GFR
ALT: 20 IU/L (ref 0–32)
AST: 11 IU/L (ref 0–40)
Albumin/Globulin Ratio: 1.2 (ref 1.2–2.2)
Albumin: 4.1 g/dL (ref 3.8–4.8)
Alkaline Phosphatase: 129 IU/L — ABNORMAL HIGH (ref 39–117)
BUN/Creatinine Ratio: 14 (ref 9–23)
BUN: 11 mg/dL (ref 6–20)
Bilirubin Total: 0.3 mg/dL (ref 0.0–1.2)
CO2: 24 mmol/L (ref 20–29)
Calcium: 9.5 mg/dL (ref 8.7–10.2)
Chloride: 97 mmol/L (ref 96–106)
Creatinine, Ser: 0.81 mg/dL (ref 0.57–1.00)
GFR calc Af Amer: 106 mL/min/1.73
GFR calc non Af Amer: 92 mL/min/1.73
Globulin, Total: 3.3 g/dL (ref 1.5–4.5)
Glucose: 442 mg/dL — ABNORMAL HIGH (ref 65–99)
Potassium: 4.8 mmol/L (ref 3.5–5.2)
Sodium: 137 mmol/L (ref 134–144)
Total Protein: 7.4 g/dL (ref 6.0–8.5)

## 2018-09-21 ENCOUNTER — Telehealth: Payer: Self-pay | Admitting: *Deleted

## 2018-09-21 ENCOUNTER — Encounter: Payer: Self-pay | Admitting: Family Medicine

## 2018-09-21 NOTE — Telephone Encounter (Signed)
Faxed patient FMLA forms as requested and original forms will be sent out to [facility]. Informed patient that fax was sent and she verbalized understanding.

## 2018-09-24 ENCOUNTER — Telehealth: Payer: Self-pay | Admitting: *Deleted

## 2018-09-24 NOTE — Telephone Encounter (Signed)
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

## 2018-10-13 ENCOUNTER — Ambulatory Visit: Payer: Self-pay | Admitting: Family Medicine

## 2018-10-25 ENCOUNTER — Encounter: Payer: Self-pay | Admitting: Family Medicine

## 2018-12-16 ENCOUNTER — Encounter: Payer: Self-pay | Admitting: Family Medicine

## 2018-12-20 ENCOUNTER — Encounter: Payer: Self-pay | Admitting: Family Medicine

## 2018-12-20 ENCOUNTER — Ambulatory Visit (INDEPENDENT_AMBULATORY_CARE_PROVIDER_SITE_OTHER): Payer: Self-pay | Admitting: Family Medicine

## 2018-12-20 DIAGNOSIS — J209 Acute bronchitis, unspecified: Secondary | ICD-10-CM

## 2018-12-20 MED ORDER — AZITHROMYCIN 250 MG PO TABS: ORAL_TABLET | ORAL | 0 refills | Status: DC

## 2018-12-20 MED ORDER — PREDNISONE 20 MG PO TABS: ORAL_TABLET | ORAL | 0 refills | Status: DC

## 2018-12-20 NOTE — Telephone Encounter (Signed)
Please advise 

## 2018-12-20 NOTE — Progress Notes (Signed)
Patient having c/o SHOB, nonproductive cough, loss of smell, chills, hot flashes. No known sick contact. Was taking Robitussin with some relief but has ran out.   Did get tested for COVID on 12/14/2018 & states that her results were negative.

## 2018-12-20 NOTE — Progress Notes (Signed)
Virtual Visit via Telephone Note  I connected with [name] on 12/20/18 at  9:50 AM EST by telephone and verified that I am speaking with the correct person using two identifiers.   I discussed the limitations, risks, security and privacy concerns of performing an evaluation and management service by telephone and the availability of in person appointments. I also discussed with the patient that there may be a patient responsible charge related to this service. The patient expressed understanding and agreed to proceed.  Patient Location: Home Provider Location: [facility] Office Others participating in call: none   History of Present Illness:       38 year old female who reports worsening symptoms over the past 1-1/2 to 2 weeks including nonproductive cough, sensation of shortness of breath, nasal congestion with loss of sensation of smell, sensation of chills but sometimes feels as though she is hot or having hot flashes.  She has also had some increase in fatigue.  She has been taking over-the-counter Robitussin-DM and this has been helping but she ran out within the past 2 days.  She reports that she did have a Covid test done on 12/14/2018 which was negative.  She denies headaches or dizziness, no chest pain or palpitations, no sore throat or difficulty swallowing.  She has had no abdominal pain-no nausea/vomiting/diarrhea or constipation.  No current issues with muscle or joint pain.  Cough is sometimes disruptive to sleep.   Past Medical History:  Diagnosis Date  . Anemia   . Diabetes mellitus without complication ([employer])    pt states she was pre diabetic but is no longer. Takes no meds and does not check her blood sugar.   . Endometrial cancer ([place])   . IBS (irritable bowel syndrome)   . Nocturia   . Obesity   . Tingling    right hand    Past Surgical History:  Procedure Laterality Date  . COLONOSCOPY WITH PROPOFOL N/A 02/14/2016   Procedure: COLONOSCOPY WITH PROPOFOL;  Surgeon:  Milus Banister, MD;  [facility]: [facility];  Service: Endoscopy;  Laterality: N/A;  . HYSTEROSCOPY W/D&C N/A 01/30/2015   Procedure: DILATATION AND CURETTAGE /HYSTEROSCOPY;  Surgeon: Azucena Fallen, MD;  Location: [facility];  Service: Gynecology;  Laterality: N/A;  . ROBOTIC ASSISTED TOTAL HYSTERECTOMY WITH BILATERAL SALPINGO OOPHERECTOMY N/A 03/22/2015   Procedure: XI ROBOTIC ASSISTED TOTAL ABDOMINAL HYSTERECTOMY FOR UTERUS GREATER THAN 250 [place] WITH BILATERAL SALPINGO OOPHORECTOMY;  Surgeon: Everitt Amber, MD;  Location: [facility];  Service: Gynecology;  Laterality: N/A;  . TYMPANOSTOMY TUBE PLACEMENT     bilat     Family History  Problem Relation Age of Onset  . Ovarian cancer Mother   . Diabetes Father   . Heart attack Father   . Throat cancer Paternal Uncle   . Diabetes Paternal Aunt   . Diabetes Maternal Uncle     Social History   Tobacco Use  . Smoking status: Never Smoker  . Smokeless tobacco: Never Used  Substance Use Topics  . Alcohol use: No    Alcohol/week: 0.0 standard drinks  . Drug use: No     Allergies  Allergen Reactions  . Asa [Aspirin] Hives  . Latex Itching  . Pineapple     Mouth itches  . Lactase Diarrhea       Observations/Objective: No vital signs or physical exam conducted as visit was done via telephone  Assessment and Plan: 1. Acute bronchitis, unspecified organism We will place patient on azithromycin Z-Pak in case of  atypical pneumonia versus acute bronchitis.  Prescription provided for prednisone 20 mg, 2 pills once daily to help with sensation of shortness of breath/chest tightness.  She is encouraged to continue using Robitussin-DM to help with cough and chest congestion.  Patient is aware that if she has any worsening of symptoms such as increased shortness of breath, difficulty breathing, chest pain or any other concerns she should go to the emergency department for further evaluation and further treatment.  She reports recent negative Covid test but  Covid should be on the differential in case of false negative or development of infection after testing.  Rest and remain well-hydrated, - azithromycin (ZITHROMAX) 250 MG tablet; Take 2 tablets on the first day then one daily for 4 days  Dispense: 6 tablet; Refill: 0 - predniSONE (DELTASONE) 20 MG tablet; Take 2 pills once per day for 5 days; take after eating  Dispense: 10 tablet; Refill: 0  Follow Up Instructions:Return for Go to emergency department if symptoms worsen or do not improve.    I discussed the assessment and treatment plan with the patient. The patient was provided an opportunity to ask questions and all were answered. The patient agreed with the plan and demonstrated an understanding of the instructions.   The patient was advised to call back or seek an in-person evaluation if the symptoms worsen or if the condition fails to improve as anticipated.  I provided 12 minutes of non-face-to-face time during this encounter.   Antony Blackbird, MD

## 2019-04-26 ENCOUNTER — Encounter: Payer: Self-pay | Admitting: Family Medicine

## 2019-05-31 ENCOUNTER — Encounter: Payer: Self-pay | Admitting: Family Medicine

## 2019-06-01 ENCOUNTER — Other Ambulatory Visit: Payer: Self-pay | Admitting: Family Medicine

## 2019-06-01 DIAGNOSIS — E1165 Type 2 diabetes mellitus with hyperglycemia: Secondary | ICD-10-CM

## 2019-06-01 MED ORDER — METFORMIN HCL 1000 MG PO TABS: 1000.0000 mg | ORAL_TABLET | Freq: Two times a day (BID) | ORAL | 0 refills | Status: DC

## 2019-06-01 MED FILL — metFORMIN HCL 1000 MG TABS: 1000 | 30 days supply | Qty: 60 | Fill #0

## 2019-06-01 NOTE — Progress Notes (Signed)
Patient ID: Krista White, female   DOB: 16-May-1980, 39 y.o.   MRN: [id number]   Patient requested refill of Metformin and 30-day supply sent to pharmacy and patient will make follow-up appointment in the meantime

## 2019-06-02 ENCOUNTER — Other Ambulatory Visit: Payer: Self-pay | Admitting: Pharmacist

## 2019-06-02 DIAGNOSIS — E1165 Type 2 diabetes mellitus with hyperglycemia: Secondary | ICD-10-CM

## 2019-06-02 MED ORDER — METFORMIN HCL 1000 MG PO TABS: 1000.0000 mg | ORAL_TABLET | Freq: Two times a day (BID) | ORAL | 0 refills | Status: DC

## 2019-06-18 ENCOUNTER — Encounter: Payer: Self-pay | Admitting: Family Medicine

## 2019-06-18 ENCOUNTER — Other Ambulatory Visit: Payer: Self-pay | Admitting: Family Medicine

## 2019-06-18 DIAGNOSIS — B009 Herpesviral infection, unspecified: Secondary | ICD-10-CM

## 2019-06-18 MED ORDER — VALACYCLOVIR HCL 1 G PO TABS: 1000.0000 mg | ORAL_TABLET | Freq: Every day | ORAL | 1 refills | Status: DC

## 2019-06-18 NOTE — Progress Notes (Signed)
Patient ID: Krista White, female   DOB: 1980/08/16, 39 y.o.   MRN: [id number]   MyChart request received from patient for refill of valacyclovir to [facility].

## 2019-07-21 ENCOUNTER — Encounter: Payer: Self-pay | Admitting: Family Medicine

## 2019-08-02 ENCOUNTER — Ambulatory Visit: Payer: Self-pay | Admitting: Internal Medicine

## 2019-09-14 ENCOUNTER — Encounter: Payer: Self-pay | Admitting: Family Medicine

## 2019-09-16 ENCOUNTER — Other Ambulatory Visit: Payer: Self-pay | Admitting: Internal Medicine

## 2019-09-16 DIAGNOSIS — E1165 Type 2 diabetes mellitus with hyperglycemia: Secondary | ICD-10-CM

## 2019-09-16 MED ORDER — METFORMIN HCL 1000 MG PO TABS: 1000.0000 mg | ORAL_TABLET | Freq: Two times a day (BID) | ORAL | 3 refills | Status: DC

## 2019-09-16 MED FILL — metFORMIN HCL 1000 MG TABS: 1000 | 30 days supply | Qty: 60 | Fill #0

## 2019-10-17 ENCOUNTER — Ambulatory Visit: Payer: Self-pay | Admitting: Internal Medicine

## 2019-10-28 ENCOUNTER — Other Ambulatory Visit: Payer: Self-pay

## 2019-10-28 ENCOUNTER — Ambulatory Visit: Payer: Self-pay | Attending: Internal Medicine | Admitting: Family Medicine

## 2019-10-28 ENCOUNTER — Encounter: Payer: Self-pay | Admitting: Family Medicine

## 2019-10-28 VITALS — BP 136/76 | HR 96 | Ht 64.0 in | Wt 368.0 lb

## 2019-10-28 DIAGNOSIS — N898 Other specified noninflammatory disorders of vagina: Secondary | ICD-10-CM

## 2019-10-28 DIAGNOSIS — Z23 Encounter for immunization: Secondary | ICD-10-CM

## 2019-10-28 DIAGNOSIS — E1165 Type 2 diabetes mellitus with hyperglycemia: Secondary | ICD-10-CM

## 2019-10-28 LAB — POCT URINALYSIS DIP (CLINITEK)
Bilirubin, UA: NEGATIVE
Glucose, UA: 500 mg/dL — AB
Ketones, POC UA: NEGATIVE mg/dL
Leukocytes, UA: NEGATIVE
Nitrite, UA: NEGATIVE
POC PROTEIN,UA: NEGATIVE
Spec Grav, UA: 1.02
Urobilinogen, UA: 0.2 U/dL
pH, UA: 5.5

## 2019-10-28 LAB — GLUCOSE, POCT (MANUAL RESULT ENTRY)
POC Glucose: 440 mg/dl — AB (ref 70–99)
POC Glucose: 425 mg/dL — AB (ref 70–99)

## 2019-10-28 LAB — POCT GLYCOSYLATED HEMOGLOBIN (HGB A1C): HbA1c POC (<> result, manual entry): >15 %

## 2019-10-28 MED ORDER — FLUCONAZOLE 150 MG PO TABS: ORAL_TABLET | ORAL | 2 refills | Status: DC

## 2019-10-28 MED ORDER — INSULIN ASPART 100 UNIT/ML ~~LOC~~ SOLN
12.0000 [IU] | Freq: Once | SUBCUTANEOUS | Status: AC
  Administered 2019-10-28: 12 [IU] via SUBCUTANEOUS

## 2019-10-28 MED ORDER — METFORMIN HCL 1000 MG PO TABS: 1000.0000 mg | ORAL_TABLET | Freq: Two times a day (BID) | ORAL | 3 refills | Status: DC

## 2019-10-28 MED ORDER — GLIPIZIDE ER 10 MG PO TB24: 10.0000 mg | ORAL_TABLET | Freq: Every day | ORAL | 2 refills | Status: DC

## 2019-10-28 NOTE — Progress Notes (Signed)
Established Patient Office Visit  Subjective:  Patient ID: Krista White, female    DOB: 1980-11-08  Age: 39 y.o. MRN: [id number]  CC:  Chief Complaint  Patient presents with  . Diabetes    HPI [name] 36, 39 year old female who is seen in follow-up of poorly controlled diabetes and patient with complaint of vaginal itching and white clumpy vaginal discharge.  She reports that she continues to take the Metformin.  She has had increased thirst, frequent urination and increased hunger.  She never started the glipizide that was previously prescribed.  She reports that she does not have a glucometer at home to monitor her blood sugars.   Past Medical History:  Diagnosis Date  . Anemia   . Diabetes mellitus without complication ([place])    pt states she was pre diabetic but is no longer. Takes no meds and does not check her blood sugar.   . Endometrial cancer ([place])   . IBS (irritable bowel syndrome)   . Nocturia   . Obesity   . Tingling    right hand    Past Surgical History:  Procedure Laterality Date  . COLONOSCOPY WITH PROPOFOL N/A 02/14/2016   Procedure: COLONOSCOPY WITH PROPOFOL;  Surgeon: Milus Banister, MD;  Location: [facility];  Service: Endoscopy;  Laterality: N/A;  . HYSTEROSCOPY WITH D & C N/A 01/30/2015   Procedure: DILATATION AND CURETTAGE /HYSTEROSCOPY;  Surgeon: Azucena Fallen, MD;  Location: [facility];  Service: Gynecology;  Laterality: N/A;  . ROBOTIC ASSISTED TOTAL HYSTERECTOMY WITH BILATERAL SALPINGO OOPHERECTOMY N/A 03/22/2015   Procedure: XI ROBOTIC ASSISTED TOTAL ABDOMINAL HYSTERECTOMY FOR UTERUS GREATER THAN [address] [place] WITH BILATERAL SALPINGO OOPHORECTOMY;  Surgeon: Everitt Amber, MD;  Location: [facility];  Service: Gynecology;  Laterality: N/A;  . TYMPANOSTOMY TUBE PLACEMENT     bilat     Family History  Problem Relation Age of Onset  . Ovarian cancer Mother   . Diabetes Father   . Heart attack Father   . Throat cancer Paternal Uncle   . Diabetes Paternal  Aunt   . Diabetes Maternal Uncle     Social History   Socioeconomic History  . Marital status: Single    Spouse name: Not on file  . Number of children: 0  . Years of education: Not on file  . Highest education level: Not on file  Occupational History    Employer: [employer]  Tobacco Use  . Smoking status: Never Smoker  . Smokeless tobacco: Never Used  Vaping Use  . Vaping Use: Never used  Substance and Sexual Activity  . Alcohol use: No    Alcohol/week: 0.0 standard drinks  . Drug use: No  . Sexual activity: Not on file  Other Topics Concern  . Not on file  Social History Narrative  . Not on file   Social Determinants of Health   Financial Resource Strain:   . Difficulty of Paying Living Expenses: Not on file  Food Insecurity:   . Worried About Charity fundraiser in the Last Year: Not on file  . Ran Out of Food in the Last Year: Not on file  Transportation Needs:   . Lack of Transportation (Medical): Not on file  . Lack of Transportation (Non-Medical): Not on file  Physical Activity:   . Days of Exercise per Week: Not on file  . Minutes of Exercise per Session: Not on file  Stress:   . Feeling of Stress : Not on file  Social  Connections:   . Frequency of Communication with Friends and Family: Not on file  . Frequency of Social Gatherings with Friends and Family: Not on file  . Attends Religious Services: Not on file  . Active Member of Clubs or Organizations: Not on file  . Attends Archivist Meetings: Not on file  . Marital Status: Not on file  Intimate Partner Violence:   . Fear of Current or Ex-Partner: Not on file  . Emotionally Abused: Not on file  . Physically Abused: Not on file  . Sexually Abused: Not on file    Outpatient Medications Prior to Visit  Medication Sig Dispense Refill  . Blood Glucose Monitoring Suppl (TRUE METRIX METER) w/Device KIT 1 each by Does not apply route 2 (two) times a day. 1 kit 0  . glucose blood (TRUE METRIX  BLOOD GLUCOSE TEST) test strip Use as instructed 100 each 12  . metFORMIN (GLUCOPHAGE) 1000 MG tablet Take 1 tablet (1,000 mg total) by mouth 2 (two) times daily with a meal. Take with food 60 tablet 3  . TRUEplus Lancets 28G MISC 1 each by Does not apply route 2 (two) times a day. 100 each 1  . azithromycin (ZITHROMAX) 250 MG tablet Take 2 tablets on the first day then one daily for 4 days (Patient not taking: Reported on 10/28/2019) 6 tablet 0  . glipiZIDE (GLUCOTROL XL) 2.5 MG 24 hr tablet Take 1 tablet (2.5 mg total) by mouth daily with breakfast. (Patient not taking: Reported on 10/28/2019) 30 tablet 2  . predniSONE (DELTASONE) 20 MG tablet Take 2 pills once per day for 5 days; take after eating (Patient not taking: Reported on 10/28/2019) 10 tablet 0  . valACYclovir (VALTREX) 1000 MG tablet Take 1 tablet (1,000 mg total) by mouth daily. For herpes suppression (Patient not taking: Reported on 10/28/2019) 90 tablet 1   No facility-administered medications prior to visit.    Allergies  Allergen Reactions  . Asa [Aspirin] Hives  . Latex Itching  . Pineapple     Mouth itches  . Lactase Diarrhea    ROS Review of Systems  Constitutional: Negative for chills and fever.  Eyes: Negative for photophobia and visual disturbance.  Respiratory: Negative for cough and shortness of breath.   Cardiovascular: Negative for chest pain and palpitations.  Gastrointestinal: Negative for abdominal pain, constipation and nausea.  Endocrine: Positive for polydipsia, polyphagia and polyuria.  Genitourinary: Positive for frequency and vaginal discharge. Negative for dysuria.  Neurological: Negative for dizziness and headaches.  Hematological: Negative for adenopathy. Does not bruise/bleed easily.  Psychiatric/Behavioral: Negative for suicidal ideas. The patient is not nervous/anxious.       Objective:    Physical Exam Vitals and nursing note reviewed.  Constitutional:      General: She is not in acute  distress.    Appearance: Normal appearance. She is obese.  Cardiovascular:     Rate and Rhythm: Normal rate and regular rhythm.  Pulmonary:     Effort: Pulmonary effort is normal.     Breath sounds: Normal breath sounds.  Abdominal:     Palpations: Abdomen is soft.     Tenderness: There is no abdominal tenderness. There is no guarding or rebound.  Musculoskeletal:     Right lower leg: No edema.     Left lower leg: No edema.  Skin:    General: Skin is warm and dry.  Neurological:     General: No focal deficit present.     Mental  Status: She is alert and oriented to person, place, and time.  Psychiatric:        Mood and Affect: Mood normal.        Behavior: Behavior normal.     BP 136/76   Pulse 96   Ht 5' 4"  (1.626 m)   Wt (!) 368 lb (166.9 kg)   LMP 03/22/2015 Comment: has been spotting for a while  SpO2 98%   BMI 63.17 kg/m  Wt Readings from Last 3 Encounters:  10/28/19 (!) 368 lb (166.9 kg)  08/05/18 (!) 413 lb (187.3 kg)  07/09/18 (!) 420 lb (190.5 kg)     Health Maintenance Due  Topic Date Due  . Hepatitis C Screening  Never done  . URINE MICROALBUMIN  Never done  . COVID-19 Vaccine (1) Never done  . INFLUENZA VACCINE  Never done   Patient was offered and agreed to have influenza immunization at today's visit   Lab Results  Component Value Date   TSH 2.03 11/24/2017   Lab Results  Component Value Date   WBC 6.1 11/24/2017   HGB 12.8 11/24/2017   HCT 38.4 11/24/2017   MCV 80.9 11/24/2017   PLT 360.0 11/24/2017   Lab Results  Component Value Date   NA 137 09/17/2018   K 4.8 09/17/2018   CO2 24 09/17/2018   GLUCOSE 442 (H) 09/17/2018   BUN 11 09/17/2018   CREATININE 0.81 09/17/2018   BILITOT 0.3 09/17/2018   ALKPHOS 129 (H) 09/17/2018   AST 11 09/17/2018   ALT 20 09/17/2018   PROT 7.4 09/17/2018   ALBUMIN 4.1 09/17/2018   CALCIUM 9.5 09/17/2018   ANIONGAP 8 03/23/2015   GFR 114.30 11/24/2017   No results found for: CHOL No results found  for: HDL No results found for: LDLCALC No results found for: TRIG No results found for: CHOLHDL Lab Results  Component Value Date   HGBA1C 13.1 (H) 08/05/2018      Assessment & Plan:  1. Type 2 diabetes mellitus with hyperglycemia, without long-term current use of insulin ([place]) She reports that she ate just prior to today's visit and had chicken with vegetables but also drink lemonade.  She has not been taking the previously prescribed glipizide.  Hemoglobin A1c at her visit on 08/05/2018 was elevated at 13.1.  She has taken her Metformin morning dose.  Glucose was elevated at 440 at today's visit.  Discussed with the patient that she will be given some short acting insulin x1 here in the office to help improve her blood sugars as sometimes the oral medications are not very effective when blood glucose is very high.  She reports that she does not have a home glucometer.  Offer was made to send 1 here to the pharmacy but patient states that she will pick up her medicines at [facility] and therefore she is encouraged to find out from the pharmacist at [facility] which glucometer is cheapest and most effective and also obtain test strips to start testing her blood sugars on a daily basis.  Information on diabetes basics and diabetes action plan provided as part of AVS.  She has been asked to record her home blood sugars and bring blood sugar diary as well as glucometer when she returns in the next 2 to 3 weeks for a visit with the clinical pharmacist.  New prescriptions are provided for her Metformin at 1000 mg twice daily as well as new prescription for glipizide at 10 mg once daily with the  first meal of the day.  She should call or return sooner if blood sugars are remaining above 250 fasting.  Urinalysis did not show any presence of ketones.  She has lost a significant amount of weight and is encouraged to continue efforts at weight loss. - POCT glucose (manual entry) - POCT URINALYSIS DIP (CLINITEK) -  insulin aspart (novoLOG) injection 12 Units - Basic Metabolic Panel - POCT glycosylated hemoglobin (Hb A1C) - metFORMIN (GLUCOPHAGE) 1000 MG tablet; Take 1 tablet (1,000 mg total) by mouth 2 (two) times daily with a meal. Take with food  Dispense: 60 tablet; Refill: 3 - glipiZIDE (GLUCOTROL XL) 10 MG 24 hr tablet; Take 1 tablet (10 mg total) by mouth daily with breakfast. Or before first meal  Dispense: 30 tablet; Refill: 2 - POCT glucose (manual entry)  2. Itching in the vaginal area She has complaint of itching in the vaginal area and white clumpy discharge.  Discussed with the patient that elevated blood sugars increase the risk for yeast infections.  Prescription has been sent to her pharmacy for fluconazole 150 mg x 1 which she will then repeat in 3 days.  She should call or return if her symptoms do not improve. - fluconazole (DIFLUCAN) 150 MG tablet; Take one pill by mouth then take second pill in 3 days  Dispense: 2 tablet; Refill: 2  3. Need for immunization against influenza She has agreed to have influenza immunization at today's visit which will be given along with educational material on influenza immunization.   Follow-up: Return for DM- 2 or 3 weeks with Luke/CPP- bring glucometer and BS diary.    Antony Blackbird, MD

## 2019-10-28 NOTE — Progress Notes (Signed)
Patient has not been taking glipizide medication for a while now.  She just recently ate lunch.

## 2019-10-28 NOTE — Patient Instructions (Signed)
Diabetes Basics  Diabetes (diabetes mellitus) is a long-term (chronic) disease. It occurs when the body does not properly use sugar (glucose) that is released from food after you eat. Diabetes may be caused by one or both of these problems:  Your pancreas does not make enough of a hormone called insulin.  Your body does not react in a normal way to insulin that it makes. Insulin lets sugars (glucose) go into cells in your body. This gives you energy. If you have diabetes, sugars cannot get into cells. This causes high blood sugar (hyperglycemia). Follow these instructions at home: How is diabetes treated? You may need to take insulin or other diabetes medicines daily to keep your blood sugar in balance. Take your diabetes medicines every day as told by your doctor. List your diabetes medicines here: Diabetes medicines  Name of medicine: ______________________________ ? Amount (dose): _______________ Time (a.m./p.m.): _______________ Notes: ___________________________________  Name of medicine: ______________________________ ? Amount (dose): _______________ Time (a.m./p.m.): _______________ Notes: ___________________________________  Name of medicine: ______________________________ ? Amount (dose): _______________ Time (a.m./p.m.): _______________ Notes: ___________________________________ If you use insulin, you will learn how to give yourself insulin by injection. You may need to adjust the amount based on the food that you eat. List the types of insulin you use here: Insulin  Insulin type: ______________________________ ? Amount (dose): _______________ Time (a.m./p.m.): _______________ Notes: ___________________________________  Insulin type: ______________________________ ? Amount (dose): _______________ Time (a.m./p.m.): _______________ Notes: ___________________________________  Insulin type: ______________________________ ? Amount (dose): _______________ Time (a.m./p.m.):  _______________ Notes: ___________________________________  Insulin type: ______________________________ ? Amount (dose): _______________ Time (a.m./p.m.): _______________ Notes: ___________________________________  Insulin type: ______________________________ ? Amount (dose): _______________ Time (a.m./p.m.): _______________ Notes: ___________________________________ How do I manage my blood sugar?  Check your blood sugar levels using a blood glucose monitor as directed by your doctor. Your doctor will set treatment goals for you. Generally, you should have these blood sugar levels:  Before meals (preprandial): 80-130 mg/dL (4.4-7.2 mmol/L).  After meals (postprandial): below 180 mg/dL (10 mmol/L).  A1c level: less than 7%. Write down the times that you will check your blood sugar levels: Blood sugar checks  Time: _______________ Notes: ___________________________________  Time: _______________ Notes: ___________________________________  Time: _______________ Notes: ___________________________________  Time: _______________ Notes: ___________________________________  Time: _______________ Notes: ___________________________________  Time: _______________ Notes: ___________________________________  What do I need to know about low blood sugar? Low blood sugar is called hypoglycemia. This is when blood sugar is at or below 70 mg/dL (3.9 mmol/L). Symptoms may include:  Feeling: ? Hungry. ? Worried or nervous (anxious). ? Sweaty and clammy. ? Confused. ? Dizzy. ? Sleepy. ? Sick to your stomach (nauseous).  Having: ? A fast heartbeat. ? A headache. ? A change in your vision. ? Tingling or no feeling (numbness) around the mouth, lips, or tongue. ? Jerky movements that you cannot control (seizure).  Having trouble with: ? Moving (coordination). ? Sleeping. ? Passing out (fainting). ? Getting upset easily (irritability). Treating low blood sugar To treat low blood  sugar, eat or drink something sugary right away. If you can think clearly and swallow safely, follow the 15:15 rule:  Take 15 grams of a fast-acting carb (carbohydrate). Talk with your doctor about how much you should take.  Some fast-acting carbs are: ? Sugar tablets (glucose pills). Take 3-4 glucose pills. ? 6-8 pieces of hard candy. ? 4-6 oz (120-150 mL) of fruit juice. ? 4-6 oz (120-150 mL) of regular (not diet) soda. ? 1 Tbsp (15 mL) honey or sugar.    Check your blood sugar 15 minutes after you take the carb.  If your blood sugar is still at or below 70 mg/dL (3.9 mmol/L), take 15 grams of a carb again.  If your blood sugar does not go above 70 mg/dL (3.9 mmol/L) after 3 tries, get help right away.  After your blood sugar goes back to normal, eat a meal or a snack within 1 hour. Treating very low blood sugar If your blood sugar is at or below 54 mg/dL (3 mmol/L), you have very low blood sugar (severe hypoglycemia). This is an emergency. Do not wait to see if the symptoms will go away. Get medical help right away. Call your local emergency services (911 in the [place] Do not drive yourself to the hospital. Questions to ask your health care provider  Do I need to meet with a diabetes educator?  What equipment will I need to care for myself at home?  What diabetes medicines do I need? When should I take them?  How often do I need to check my blood sugar?  What number can I call if I have questions?  When is my next doctor's visit?  Where can I find a support group for people with diabetes? Where to find more information  American Diabetes Association: www.diabetes.org  American Association of Diabetes Educators: www.diabeteseducator.org/patient-resources Contact a doctor if:  Your blood sugar is at or above 240 mg/dL (13.3 mmol/L) for 2 days in a row.  You have been sick or have had a fever for 2 days or more, and you are not getting better.  You have any of these  problems for more than 6 hours: ? You cannot eat or drink. ? You feel sick to your stomach (nauseous). ? You throw up (vomit). ? You have watery poop (diarrhea). Get help right away if:  Your blood sugar is lower than 54 mg/dL (3 mmol/L).  You get confused.  You have trouble: ? Thinking clearly. ? Breathing. Summary  Diabetes (diabetes mellitus) is a long-term (chronic) disease. It occurs when the body does not properly use sugar (glucose) that is released from food after digestion.  Take insulin and diabetes medicines as told.  Check your blood sugar every day, as often as told.  Keep all follow-up visits as told by your doctor. This is important. This information is not intended to replace advice given to you by your health care provider. Make sure you discuss any questions you have with your health care provider. Document Revised: 10/13/2018 Document Reviewed: 04/24/2017 Elsevier Patient Education  2020 [place] Following a diabetes action plan is a way for you to manage your diabetes (diabetes mellitus) symptoms. The plan is color-coded to help you understand what actions you need to take based on any symptoms you are having.  If you have symptoms in the red zone, you need medical care right away.  If you have symptoms in the yellow zone, it means you are having problems.  If you have symptoms in the green zone, you are doing well. Learning about and understanding diabetes can take time. Follow the plan that you develop with your health care provider. Know the target range for your blood sugar (glucose) level, and review your treatment plan with your health care provider at each visit. The target range for my blood sugar level is __________________________ mg/dL. Red zone Get medical help right away if you have any of the following symptoms:  A blood sugar test result  that is below 54 mg/dL (3 mmol/L).  A blood sugar test result that is at or  above 240 mg/dL (13.3 mmol/L) for 2 days in a row.  Confusion or trouble thinking clearly.  Difficulty breathing.  Sickness or a fever for 2 or more days that is not getting better.  Moderate or large ketone levels in your urine. If you have any red zone symptoms, call emergency services (911 in the [place] or go to the nearest emergency room. If you have severely low blood sugar (severe hypoglycemia) and you cannot eat or drink, you may need an injection of glucagon. Make sure a family member or close friend knows how to check your blood sugar and how to give you a glucagon injection. You may need to be treated in a hospital for this condition. Yellow zone If you have any of the following symptoms, your diabetes is not under control and you may need to make some changes:  Blood sugar test results that are below 70 mg/dL (3.9 mmol/L).  Other symptoms of hypoglycemia, such as: ? Shaking or feeling light-headed. ? Confusion or irritability. ? Feeling hungry. ? Having a fast heartbeat.  A blood sugar test result that is higher than 240 mg/dL (13.3 mmol/L) for 2 days in a row.  A fever.  Feeling tired, or not having any energy. If you have any yellow zone symptoms:  Treat your low blood sugar (hypoglycemia) by eating or drinking 15 grams of a rapid-acting carbohydrate. Follow the 15:15 rule: ? Take 15 grams of a rapid-acting carbohydrate, such as:  1 tube of glucose gel.  3 glucose pills.  6-8 pieces of hard candy.  4 oz (120 mL) of fruit juice.  4 oz (120 mL) of regular (not diet) soda. ? Check your blood sugar 15 minutes after you take the carbohydrate. ? If the repeat blood sugar test is still at or below 70 mg/dL (3.9 mmol/L), take 15 grams of a carbohydrate again. ? If your blood sugar does not increase above 70 mg/dL (3.9 mmol/L) after 3 tries, get medical help right away. ? After your blood sugar returns to normal, eat a meal or a snack within 1 hour.  Keep taking your  daily medicines as directed.  Check your blood sugar more often than you normally would. ? Write down your results. ? Call your health care provider if you have trouble keeping your blood sugar in your target range.  Green zone These signs mean you are doing well and you can continue what you are doing to manage your diabetes:  Your blood sugar is within your personal target range. For most people, a blood sugar level before a meal (preprandial) should be 80-130 mg/dL.  You feel well, and you are able to do daily activities. If you are in the green zone, continue to manage your diabetes as directed. To do this:  Eat a healthy diet.  Exercise regularly.  Check your blood sugar as directed.  Take your medicines as directed.  Where to find more information You can find more information about diabetes from:  American Diabetes Association (ADA): www.diabetes.org  American Association of Diabetes Educators (AADE): www.diabeteseducator.org Summary  Following a diabetes action plan is a way for you to manage your diabetes symptoms. The plan is color-coded to help you understand what actions you need to take based on any symptoms you are having.  Follow the plan that you develop with your health care provider. Make sure you know your personal  target blood sugar level.  Review your treatment plan with your health care provider at each visit. This information is not intended to replace advice given to you by your health care provider. Make sure you discuss any questions you have with your health care provider. Document Revised: 03/20/2017 Document Reviewed: 11/12/2016 Elsevier Patient Education  2020 [employer].

## 2019-10-29 LAB — BASIC METABOLIC PANEL WITH GFR
BUN/Creatinine Ratio: 12 (ref 9–23)
BUN: 10 mg/dL (ref 6–24)
CO2: 27 mmol/L (ref 20–29)
Calcium: 9.4 mg/dL (ref 8.7–10.2)
Chloride: 98 mmol/L (ref 96–106)
Creatinine, Ser: 0.82 mg/dL (ref 0.57–1.00)
GFR calc Af Amer: 104 mL/min/1.73
GFR calc non Af Amer: 90 mL/min/1.73
Glucose: 381 mg/dL — ABNORMAL HIGH (ref 65–99)
Potassium: 4.5 mmol/L (ref 3.5–5.2)
Sodium: 138 mmol/L (ref 134–144)

## 2019-10-31 NOTE — Progress Notes (Signed)
Normal/stable result letter sent via [facility]

## 2019-11-11 ENCOUNTER — Ambulatory Visit: Payer: Self-pay | Admitting: Pharmacist

## 2020-05-23 ENCOUNTER — Other Ambulatory Visit: Payer: Self-pay | Admitting: Critical Care Medicine

## 2020-05-23 ENCOUNTER — Other Ambulatory Visit: Payer: Self-pay

## 2020-05-23 ENCOUNTER — Telehealth: Payer: Self-pay

## 2020-05-23 ENCOUNTER — Other Ambulatory Visit (HOSPITAL_COMMUNITY): Payer: Self-pay

## 2020-05-23 DIAGNOSIS — E1165 Type 2 diabetes mellitus with hyperglycemia: Secondary | ICD-10-CM

## 2020-05-23 DIAGNOSIS — E0800 Diabetes mellitus due to underlying condition with hyperosmolarity without nonketotic hyperglycemic-hyperosmolar coma (NKHHC): Secondary | ICD-10-CM

## 2020-05-23 LAB — GLUCOSE, POCT (MANUAL RESULT ENTRY): POC Glucose: 527 mg/dl (ref 70–99)

## 2020-05-23 MED ORDER — METFORMIN HCL 1000 MG PO TABS
1000.0000 mg | ORAL_TABLET | Freq: Two times a day (BID) | ORAL | 3 refills | Status: DC
  Filled 2020-05-23: qty 60, 30d supply, fill #0

## 2020-05-23 MED ORDER — GLIPIZIDE ER 10 MG PO TB24
10.0000 mg | ORAL_TABLET | Freq: Every day | ORAL | 2 refills | Status: DC
  Filled 2020-05-23: qty 30, 30d supply, fill #0

## 2020-05-23 MED ORDER — GLIPIZIDE ER 10 MG PO TB24
10.0000 mg | ORAL_TABLET | Freq: Every day | ORAL | 0 refills | Status: DC
  Filled 2020-05-23: qty 30, 30d supply, fill #0

## 2020-05-23 MED ORDER — METFORMIN HCL 500 MG PO TABS
1000.0000 mg | ORAL_TABLET | Freq: Two times a day (BID) | ORAL | 0 refills | Status: DC
  Filled 2020-05-23: qty 120, 30d supply, fill #0

## 2020-05-23 NOTE — [facility] Nurse Program (Signed)
  Dept: [facility] Nurse Program Note  Date of Encounter: 05/23/2020  Past Medical History: Past Medical History:  Diagnosis Date  . Anemia   . Diabetes mellitus without complication ([place])    pt states she was pre diabetic but is no longer. Takes no meds and does not check her blood sugar.   . Endometrial cancer ([place])   . IBS (irritable bowel syndrome)   . Nocturia   . Obesity   . Tingling    right hand    Encounter Details:  CNP Questionnaire - 05/23/20 1906      Questionnaire   Visit Setting Other    Location Patient Served At [place]    Patient Status Homeless    Medical Provider Yes    Insurance Uninsured (Includes Orange Card/[employer]s)    Intervention Counsel;Educate;Refer;Support    Housing/Utilities No permanent housing    Medication Have medication insecurities    Referrals PCP - [facility]    ED Visit Averted Yes         Initial visit to see nurse,admitted to [facility] last Thursday . States she is feeling aliitle anxious about being at the shelter and her present situation. States she is a diabetic ,diagnosed about 1 1/2 year .has been off her medications since January when she ran out, from [place] but has been in [place] 5 years, no family here but has a church family she depemds on. Little support from her biological family. No children . Tries to watch what she eats ,not sure what her blood sugar levels are ,states probably high ,drives ,no health insurance,,has lost about  61 lbs . Applied for food stamps .was working at [employer] for about 5 years ,job ended during [employer] ,had to work from home and became isolated ,nota good time for her and things just happened,,sees a Social worker  At [employer] ,whom has a Network engineer and feels good about that. Counseled regarding her high blood sugar level. Nurse will call Dr Viona Gilmore to get direction on care . Medications ordered and picked up ,client took first dose of metformin 1,000 mg at  6:10 pm. to be seen on mobile van in am ,complete orange card application ,return to see nurse next week . Blood sugar at 3;51 pm was 527 mg.

## 2020-05-23 NOTE — Telephone Encounter (Signed)
Telephone call to report blood sugar level of 527 mg at 3;51 pm,client has been off medications since January ,when she ran out. Dr w will order medications Glipizide  And Metformin and nurse /SW picked up at Out patient pharmacy and client took first dose.  Follow up next week

## 2020-05-24 ENCOUNTER — Ambulatory Visit: Payer: Self-pay | Admitting: Critical Care Medicine

## 2020-05-24 ENCOUNTER — Other Ambulatory Visit: Payer: Self-pay

## 2020-05-24 VITALS — BP 140/99 | HR 86 | Ht 64.0 in | Wt 372.0 lb

## 2020-05-24 DIAGNOSIS — Z1159 Encounter for screening for other viral diseases: Secondary | ICD-10-CM

## 2020-05-24 DIAGNOSIS — Z5900 Homelessness unspecified: Secondary | ICD-10-CM | POA: Insufficient documentation

## 2020-05-24 DIAGNOSIS — E1165 Type 2 diabetes mellitus with hyperglycemia: Secondary | ICD-10-CM | POA: Insufficient documentation

## 2020-05-24 DIAGNOSIS — Z6841 Body Mass Index (BMI) 40.0 and over, adult: Secondary | ICD-10-CM

## 2020-05-24 HISTORY — DX: Homelessness unspecified: Z59.00

## 2020-05-24 LAB — GLUCOSE, POCT (MANUAL RESULT ENTRY): POC Glucose: 358 mg/dl — AB (ref 70–99)

## 2020-05-24 LAB — POCT GLYCOSYLATED HEMOGLOBIN (HGB A1C): HbA1c POC (<> result, manual entry): >15 % (ref 4.0–5.6)

## 2020-05-24 MED ORDER — ATORVASTATIN CALCIUM 20 MG PO TABS
20.0000 mg | ORAL_TABLET | Freq: Every day | ORAL | 3 refills | Status: DC
  Filled 2020-05-24: qty 30, 30d supply, fill #0
  Filled 2020-08-05: qty 30, 30d supply, fill #1

## 2020-05-24 MED ORDER — TRULICITY 0.75 MG/0.5ML ~~LOC~~ SOAJ
0.7500 mg | SUBCUTANEOUS | 4 refills | Status: DC
  Filled 2020-05-24: qty 2, 28d supply, fill #0

## 2020-05-24 MED ORDER — TRUE METRIX METER W/DEVICE KIT
PACK | 0 refills | Status: DC
  Filled 2020-05-24: qty 1, 365d supply, fill #0

## 2020-05-24 MED ORDER — METFORMIN HCL 500 MG PO TABS
1000.0000 mg | ORAL_TABLET | Freq: Two times a day (BID) | ORAL | 3 refills | Status: DC
  Filled 2020-05-24 – 2020-08-05 (×3): qty 120, 30d supply, fill #0

## 2020-05-24 MED ORDER — TRUE METRIX BLOOD GLUCOSE TEST VI STRP
ORAL_STRIP | 12 refills | Status: DC
  Filled 2020-05-24: qty 100, 25d supply, fill #0

## 2020-05-24 MED ORDER — GLIPIZIDE ER 10 MG PO TB24
10.0000 mg | ORAL_TABLET | Freq: Every day | ORAL | 1 refills | Status: DC
  Filled 2020-05-24 (×2): qty 30, 30d supply, fill #0

## 2020-05-24 MED ORDER — TRUEPLUS LANCETS 28G MISC
1 refills | Status: DC
  Filled 2020-05-24: qty 100, 25d supply, fill #0

## 2020-05-24 NOTE — Assessment & Plan Note (Addendum)
We will continue glipizide and metformin for now and add Trulicity 75 mcg weekly Will get patient assistance for the Trulicity and have her meet with our clinical pharmacist for training on Trulicity  We were not able to draw blood on the MMU today so we will obtain metabolic panel blood counts lipid panel when she comes to the clinic to meet with clinical pharmacy and pick up her Trulicity and her atorvastatin in a week  Will begin atorvastatin in advance of the lipid panel  Patient given a healthy diet recommendation  Patient issued new glucose testing meters and supplies

## 2020-05-24 NOTE — Assessment & Plan Note (Signed)
Patient currently homeless but in the [place] with a good support system

## 2020-05-24 NOTE — Patient Instructions (Signed)
Complete set of labs will be obtained today  [facility] for Trulicity to inject once weekly you will have an appointment with Lurena Joiner our clinical pharmacist next week to be trained on this when you pick up your medicine  Begin atorvastatin 1 daily again you will pick this up next week when you go to the clinical pharmacist  Continue metformin and glipizide as you are currently taking  Continue to hydrate yourself very well  Will monitor your blood pressure for now  Follow healthy diet as you can below  I am checking a hepatitis C assay  Urine for microalbumin will be obtained as well to check for protein in the urine  Return to see Dr. Joya Gaskins 6 weeks to further establish the [facility]  Dulaglutide Injection What is this medicine? DULAGLUTIDE (DOO la GLOO tide) controls blood sugar in people with type 2 diabetes. It is used with lifestyle changes like diet and exercise. It may lower the risk of problems that need treatment in the hospital. These problems include heart attack or stroke. This medicine may be used for other purposes; ask your health care provider or pharmacist if you have questions. COMMON BRAND NAME(S): Trulicity What should I tell my health care provider before I take this medicine? They need to know if you have any of these conditions:  endocrine tumors (MEN 2) or if someone in your family had these tumors  eye disease, vision problems  history of pancreatitis  kidney disease  liver disease  stomach or intestine problems  thyroid cancer or if someone in your family had thyroid cancer  an unusual or allergic reaction to dulaglutide, other medicines, foods, dyes, or preservatives  pregnant or trying to get pregnant  breast-feeding How should I use this medicine? This medicine is injected under the skin. You will be taught how to prepare and give it. Take it as directed on the  prescription label on the same day of each week. Do NOT prime the pen. Keep taking it unless your health care provider tells you to stop. If you use this medicine with insulin, you should inject this medicine and the insulin separately. Do not mix them together. Do not give the injections right next to each other. Change (rotate) injection sites with each injection. This drug comes with INSTRUCTIONS FOR USE. Ask your pharmacist for directions on how to use this medicine. Read the information carefully. Talk to your pharmacist or health care provider if you have questions. It is important that you put your used needles and syringes in a special sharps container. Do not put them in a trash can. If you do not have a sharps container, call your pharmacist or health care provider to get one. A special MedGuide will be given to you by the pharmacist with each prescription and refill. Be sure to read this information carefully each time. Talk to your health care provider about the use of this medicine in children. Special care may be needed. Overdosage: If you think you have taken too much of this medicine contact a poison control center or emergency room at once. NOTE: This medicine is only for you. Do not share this medicine with others. What if I miss a dose? If you miss a dose, take it as soon as you can unless it is more than 3 days late. If it is more than 3 days late, skip the missed dose. Take the next  dose at the normal time. What may interact with this medicine?  other medicines for diabetes Many medications may cause changes in blood sugar, these include:  alcohol containing beverages  antiviral medicines for HIV or AIDS  aspirin and aspirin-like drugs  certain medicines for blood pressure, heart disease, irregular heart beat  chromium  diuretics  female hormones, such as estrogens or progestins, birth control pills  fenofibrate  gemfibrozil  isoniazid  lanreotide  female  hormones or anabolic steroids  MAOIs like Carbex, Eldepryl, Marplan, Nardil, and Parnate  medicines for allergies, asthma, cold, or cough  medicines for depression, anxiety, or psychotic disturbances  medicines for weight loss  niacin  nicotine  NSAIDs, medicines for pain and inflammation, like ibuprofen or naproxen  octreotide  pasireotide  pentamidine  phenytoin  probenecid  quinolone antibiotics such as ciprofloxacin, levofloxacin, ofloxacin  some herbal dietary supplements  steroid medicines such as prednisone or cortisone  sulfamethoxazole; trimethoprim  thyroid hormones Some medications can hide the warning symptoms of low blood sugar (hypoglycemia). You may need to monitor your blood sugar more closely if you are taking one of these medications. These include:  beta-blockers, often used for high blood pressure or heart problems (examples include atenolol, metoprolol, propranolol)  clonidine  guanethidine  reserpine This list may not describe all possible interactions. Give your health care provider a list of all the medicines, herbs, non-prescription drugs, or dietary supplements you use. Also tell them if you smoke, drink alcohol, or use illegal drugs. Some items may interact with your medicine. What should I watch for while using this medicine? Visit your health care provider for regular checks on your progress. Check with your health care provider if you have severe diarrhea, nausea, and vomiting, or if you sweat a lot. The loss of too much body fluid may make it dangerous for you to take this medicine. A test called the HbA1C (A1C) will be monitored. This is a simple blood test. It measures your blood sugar control over the last 2 to 3 months. You will receive this test every 3 to 6 months. Learn how to check your blood sugar. Learn the symptoms of low and high blood sugar and how to manage them. Always carry a quick-source of sugar with you in case you  have symptoms of low blood sugar. Examples include hard sugar candy or glucose tablets. Make sure others know that you can choke if you eat or drink when you develop serious symptoms of low blood sugar, such as seizures or unconsciousness. Get medical help at once. Tell your health care provider if you have high blood sugar. You might need to change the dose of your medicine. If you are sick or exercising more than usual, you may need to change the dose of your medicine. Do not skip meals. Ask your health care provider if you should avoid alcohol. Many nonprescription cough and cold products contain sugar or alcohol. These can affect blood sugar. Pens should never be shared. Even if the needle is changed, sharing may result in passing of viruses like hepatitis or HIV. Wear a medical ID bracelet or chain. Carry a card that describes your condition. List the medicines and doses you take on the card. What side effects may I notice from receiving this medicine? Side effects that you should report to your doctor or health care professional as soon as possible:  allergic reactions (skin rash, itching or hives; swelling of the face, lips, or tongue)  changes in vision  diarrhea that continues or is severe  infection (fever, chills, cough, sore throat, pain or trouble passing urine)  kidney injury (trouble passing urine or change in the amount of urine)  low blood sugar (feeling anxious; confusion; dizziness; increased hunger; unusually weak or tired; increased sweating; shakiness; cold, clammy skin; irritable; headache; blurred vision; fast heartbeat; loss of consciousness)  lump or swelling on the neck  trouble breathing  trouble swallowing  unusual stomach upset or pain  vomiting Side effects that usually do not require medical attention (report to your doctor or health care professional if they continue or are bothersome):  lack or loss of appetite  nausea  pain, redness, or irritation  at site where injected This list may not describe all possible side effects. Call your doctor for medical advice about side effects. You may report side effects to FDA at 1-800-FDA-1088. Where should I keep my medicine? Keep out of the reach of children and pets. Refrigeration (preferred): Store unopened pens in a refrigerator between 2 and 8 degrees C (36 and 46 degrees F). Keep it in the original carton until you are ready to take it. Do not freeze or use if the medicine has been frozen. Protect from light. Get rid of any unused medicine after the expiration date on the label. Room Temperature: The pen may be stored at room temperature below 30 degrees C (86 degrees F) for up to a total of 14 days if needed. Protect from light. Avoid exposure to extreme heat. If it is stored at room temperature, throw away any unused medicine after 14 days or after it expires, whichever is first. To get rid of medicines that are no longer needed or have expired:  Take the medicine to a medicine take-back program. Check with your pharmacy or law enforcement to find a location.  If you cannot return the medicine, ask your pharmacist or health care provider how to get rid of this medicine safely. NOTE: This sheet is a summary. It may not cover all possible information. If you have questions about this medicine, talk to your doctor, pharmacist, or health care provider.  2021 Elsevier/Gold Standard (2019-11-21 07:35:51)  Diabetes Mellitus and Nutrition, Adult When you have diabetes, or diabetes mellitus, it is very important to have healthy eating habits because your blood sugar (glucose) levels are greatly affected by what you eat and drink. Eating healthy foods in the right amounts, at about the same times every day, can help you:  Control your blood glucose.  Lower your risk of heart disease.  Improve your blood pressure.  Reach or maintain a healthy weight. What can affect my meal plan? Every person with  diabetes is different, and each person has different needs for a meal plan. Your health care provider may recommend that you work with a dietitian to make a meal plan that is best for you. Your meal plan may vary depending on factors such as:  The calories you need.  The medicines you take.  Your weight.  Your blood glucose, blood pressure, and cholesterol levels.  Your activity level.  Other health conditions you have, such as heart or kidney disease. How do carbohydrates affect me? Carbohydrates, also called carbs, affect your blood glucose level more than any other type of food. Eating carbs naturally raises the amount of glucose in your blood. Carb counting is a method for keeping track of how many carbs you eat. Counting carbs is important to keep your blood glucose at a healthy level,  especially if you use insulin or take certain oral diabetes medicines. It is important to know how many carbs you can safely have in each meal. This is different for every person. Your dietitian can help you calculate how many carbs you should have at each meal and for each snack. How does alcohol affect me? Alcohol can cause a sudden decrease in blood glucose (hypoglycemia), especially if you use insulin or take certain oral diabetes medicines. Hypoglycemia can be a life-threatening condition. Symptoms of hypoglycemia, such as sleepiness, dizziness, and confusion, are similar to symptoms of having too much alcohol.  Do not drink alcohol if: ? Your health care provider tells you not to drink. ? You are pregnant, may be pregnant, or are planning to become pregnant.  If you drink alcohol: ? Do not drink on an empty stomach. ? Limit how much you use to:  0-1 drink a day for women.  0-2 drinks a day for men. ? Be aware of how much alcohol is in your drink. In the [place] one drink equals one 12 oz bottle of beer (355 mL), one 5 oz glass of wine (148 mL), or one 1 oz glass of hard liquor (44 mL). ? Keep  yourself hydrated with water, diet soda, or unsweetened iced tea.  Keep in mind that regular soda, juice, and other mixers may contain a lot of sugar and must be counted as carbs. What are tips for following this plan? Reading food labels  Start by checking the serving size on the "Nutrition Facts" label of packaged foods and drinks. The amount of calories, carbs, fats, and other nutrients listed on the label is based on one serving of the item. Many items contain more than one serving per package.  Check the total grams (g) of carbs in one serving. You can calculate the number of servings of carbs in one serving by dividing the total carbs by 15. For example, if a food has 30 g of total carbs per serving, it would be equal to 2 servings of carbs.  Check the number of grams (g) of saturated fats and trans fats in one serving. Choose foods that have a low amount or none of these fats.  Check the number of milligrams (mg) of salt (sodium) in one serving. Most people should limit total sodium intake to less than 2,300 mg per day.  Always check the nutrition information of foods labeled as "low-fat" or "nonfat." These foods may be higher in added sugar or refined carbs and should be avoided.  Talk to your dietitian to identify your daily goals for nutrients listed on the label. Shopping  Avoid buying canned, pre-made, or processed foods. These foods tend to be high in fat, sodium, and added sugar.  Shop around the outside edge of the grocery store. This is where you will most often find fresh fruits and vegetables, bulk grains, fresh meats, and fresh dairy. Cooking  Use low-heat cooking methods, such as baking, instead of high-heat cooking methods like deep frying.  Cook using healthy oils, such as olive, canola, or sunflower oil.  Avoid cooking with butter, cream, or high-fat meats. Meal planning  Eat meals and snacks regularly, preferably at the same times every day. Avoid going long  periods of time without eating.  Eat foods that are high in fiber, such as fresh fruits, vegetables, beans, and whole grains. Talk with your dietitian about how many servings of carbs you can eat at each meal.  Eat 4-6 oz (  112-168 g) of lean protein each day, such as lean meat, chicken, fish, eggs, or tofu. One ounce (oz) of lean protein is equal to: ? 1 oz (28 g) of meat, chicken, or fish. ? 1 egg. ?  cup (62 g) of tofu.  Eat some foods each day that contain healthy fats, such as avocado, nuts, seeds, and fish.   What foods should I eat? Fruits Berries. Apples. Oranges. Peaches. Apricots. Plums. Grapes. Mango. Papaya. Pomegranate. Kiwi. Cherries. Vegetables Lettuce. Spinach. Leafy greens, including kale, chard, collard greens, and mustard greens. Beets. Cauliflower. Cabbage. Broccoli. Carrots. Green beans. Tomatoes. Peppers. Onions. Cucumbers. Brussels sprouts. Grains Whole grains, such as whole-wheat or whole-grain bread, crackers, tortillas, cereal, and pasta. Unsweetened oatmeal. Quinoa. Brown or wild rice. Meats and other proteins Seafood. Poultry without skin. Lean cuts of poultry and beef. Tofu. Nuts. Seeds. Dairy Low-fat or fat-free dairy products such as milk, yogurt, and cheese. The items listed above may not be a complete list of foods and beverages you can eat. Contact a dietitian for more information. What foods should I avoid? Fruits Fruits canned with syrup. Vegetables Canned vegetables. Frozen vegetables with butter or cream sauce. Grains Refined white flour and flour products such as bread, pasta, snack foods, and cereals. Avoid all processed foods. Meats and other proteins Fatty cuts of meat. Poultry with skin. Breaded or fried meats. Processed meat. Avoid saturated fats. Dairy Full-fat yogurt, cheese, or milk. Beverages Sweetened drinks, such as soda or iced tea. The items listed above may not be a complete list of foods and beverages you should avoid. Contact a  dietitian for more information. Questions to ask a health care provider  Do I need to meet with a diabetes educator?  Do I need to meet with a dietitian?  What number can I call if I have questions?  When are the best times to check my blood glucose? Where to find more information:  American Diabetes Association: diabetes.org  Academy of Nutrition and Dietetics: www.eatright.[employer] of Diabetes and Digestive and Kidney Diseases: [employer]  Association of Diabetes Care and Education Specialists: www.diabeteseducator.org Summary  It is important to have healthy eating habits because your blood sugar (glucose) levels are greatly affected by what you eat and drink.  A healthy meal plan will help you control your blood glucose and maintain a healthy lifestyle.  Your health care provider may recommend that you work with a dietitian to make a meal plan that is best for you.  Keep in mind that carbohydrates (carbs) and alcohol have immediate effects on your blood glucose levels. It is important to count carbs and to use alcohol carefully. This information is not intended to replace advice given to you by your health care provider. Make sure you discuss any questions you have with your health care provider. Document Revised: 12/28/2018 Document Reviewed: 12/28/2018 Elsevier Patient Education  2021 [employer].

## 2020-05-24 NOTE — Progress Notes (Signed)
Sugar level elevated. Just restarted medication yesterday.

## 2020-05-24 NOTE — Progress Notes (Signed)
Subjective:    Patient ID: Krista White, female    DOB: Oct 21, 1980, 40 y.o.   MRN: [id number]  40 y.o.F with T2DM. Obesity   BS 527 at [facility] visit yesterday  05/24/2020 This is a former primary care patient of Dr. Chapman Fitch last seen in September 2021.  Patient now is in the [facility] shelter since 14 April.  She lost her housing because she had difficulty with family relations.  She is had diabetes diagnosed for 2 years had morbid obesity for some time with a BMI of greater than 60.  On arrival blood pressure was 140/99.  Yesterday in the clinic at the [facility] her blood sugar was over 500.  On arrival it is today 73 with an A1c greater than 15  At the last office visit in September the documentation was as below Type 2 diabetes mellitus with hyperglycemia, without long-term current use of insulin ([place]) She reports that she ate just prior to today's visit and had chicken with vegetables but also drink lemonade.  She has not been taking the previously prescribed glipizide.  Hemoglobin A1c at her visit on 08/05/2018 was elevated at 13.1.  She has taken her Metformin morning dose.  Glucose was elevated at 440 at today's visit.  Discussed with the patient that she will be given some short acting insulin x1 here in the office to help improve her blood sugars as sometimes the oral medications are not very effective when blood glucose is very high.  She reports that she does not have a home glucometer.  Offer was made to send 1 here to the pharmacy but patient states that she will pick up her medicines at [facility] and therefore she is encouraged to find out from the pharmacist at [facility] which glucometer is cheapest and most effective and also obtain test strips to start testing her blood sugars on a daily basis.  Information on diabetes basics and diabetes action plan provided as part of AVS.  She has been asked to record her home blood sugars and bring blood sugar diary as well as  glucometer when she returns in the next 2 to 3 weeks for a visit with the clinical pharmacist.  New prescriptions are provided for her Metformin at 1000 mg twice daily as well as new prescription for glipizide at 10 mg once daily with the first meal of the day.  She should call or return sooner if blood sugars are remaining above 250 fasting.  Urinalysis did not show any presence of ketones.  She has lost a significant amount of weight and is encouraged to continue efforts at weight loss. - POCT glucose (manual entry) - POCT URINALYSIS DIP (CLINITEK) - insulin aspart (novoLOG) injection 12 Units - Basic Metabolic Panel - POCT glycosylated hemoglobin (Hb A1C) - metFORMIN (GLUCOPHAGE) 1000 MG tablet; Take 1 tablet (1,000 mg total) by mouth 2 (two) times daily with a meal. Take with food  Dispense: 60 tablet; Refill: 3 - glipiZIDE (GLUCOTROL XL) 10 MG 24 hr tablet; Take 1 tablet (10 mg total) by mouth daily with breakfast. Or before first meal  Dispense: 30 tablet; Refill: 2 - POCT glucose (manual entry)  2. Itching in the vaginal area She has complaint of itching in the vaginal area and white clumpy discharge.  Discussed with the patient that elevated blood sugars increase the risk for yeast infections.  Prescription has been sent to her pharmacy for fluconazole 150 mg x 1 which she will then  repeat in 3 days.  She should call or return if her symptoms do not improve. - fluconazole (DIFLUCAN) 150 MG tablet; Take one pill by mouth then take second pill in 3 days  Dispense: 2 tablet; Refill: 2  3. Need for immunization against influenza She has agreed to have influenza immunization at today's visit which will be given along with educational material on influenza immunization.  Patient is received all of her COVID vaccinations.  She is in need of a foot exam and urine microalbumin.  She has no other primary care gaps and she will need to be reassigned to primary care at [facility]  Patient  has no other specific complaints on review of systems at this time except for polyuria and polydipsia   Past Medical History:  Diagnosis Date  . Anemia   . Diabetes mellitus without complication ([place])    pt states she was pre diabetic but is no longer. Takes no meds and does not check her blood sugar.   . Endometrial cancer ([place])   . Endometrial cancer ([place]) 02/21/2015  . IBS (irritable bowel syndrome)   . Nocturia   . Obesity   . Tingling    right hand     Family History  Problem Relation Age of Onset  . Ovarian cancer Mother   . Diabetes Father   . Heart attack Father   . Throat cancer Paternal Uncle   . Diabetes Paternal Aunt   . Diabetes Maternal Uncle      Social History   Socioeconomic History  . Marital status: Single    Spouse name: Not on file  . Number of children: 0  . Years of education: Not on file  . Highest education level: Not on file  Occupational History    Employer: [employer]  Tobacco Use  . Smoking status: Never Smoker  . Smokeless tobacco: Never Used  Vaping Use  . Vaping Use: Never used  Substance and Sexual Activity  . Alcohol use: No    Alcohol/week: 0.0 standard drinks  . Drug use: No  . Sexual activity: Not on file  Other Topics Concern  . Not on file  Social History Narrative  . Not on file   Social Determinants of Health   Financial Resource Strain: Not on file  Food Insecurity: Not on file  Transportation Needs: Not on file  Physical Activity: Not on file  Stress: Not on file  Social Connections: Not on file  Intimate Partner Violence: Not on file     Allergies  Allergen Reactions  . Asa [Aspirin] Hives  . Latex Itching  . Pineapple     Mouth itches  . Lactase Diarrhea     Outpatient Medications Prior to Visit  Medication Sig Dispense Refill  . Blood Glucose Monitoring Suppl (TRUE METRIX METER) w/Device KIT 1 each by Does not apply route 2 (two) times a day. 1 kit 0  . glipiZIDE (GLUCOTROL XL) 10 MG 24 hr tablet  Take 1 tablet (10 mg total) by mouth daily with breakfast. Or before first meal 30 tablet 2  . glipiZIDE (GLUCOTROL XL) 10 MG 24 hr tablet Take 1 tablet (10 mg total) by mouth daily with breakfast. 30 tablet 0  . glucose blood (TRUE METRIX BLOOD GLUCOSE TEST) test strip Use as instructed 100 each 12  . metFORMIN (GLUCOPHAGE) 1000 MG tablet Take 1 tablet (1,000 mg total) by mouth 2 (two) times daily with a meal. Take with food 60 tablet 3  .  metFORMIN (GLUCOPHAGE) 500 MG tablet Take 2 tablets (1,000 mg total) by mouth 2 (two) times daily with a meal. 120 tablet 0  . TRUEplus Lancets 28G MISC 1 each by Does not apply route 2 (two) times a day. 100 each 1   No facility-administered medications prior to visit.      Review of Systems  Constitutional: Negative for fatigue.  HENT: Negative.   Respiratory: Negative.   Cardiovascular: Negative.   Gastrointestinal: Negative.   Endocrine: Positive for polydipsia and polyuria. Negative for polyphagia.  Genitourinary: Positive for frequency. Negative for decreased urine volume, dysuria, urgency, vaginal bleeding, vaginal discharge and vaginal pain.  Musculoskeletal: Negative.   Neurological: Negative.   Psychiatric/Behavioral: Negative.  Negative for self-injury and suicidal ideas.       Objective:   Physical Exam Vitals:   05/24/20 0912  BP: (!) 140/99  Pulse: 86  SpO2: 97%  Weight: (!) 372 lb (168.7 kg)  Height: _0  (1.626 m)    Gen: Pleasant, morbidly obese, in no distress,  normal affect  ENT: No lesions,  mouth clear,  oropharynx clear, no postnasal drip  Neck: No JVD, no TMG, no carotid bruits  Lungs: No use of accessory muscles, no dullness to percussion, clear without rales or rhonchi  Cardiovascular: RRR, heart sounds normal, no murmur or gallops, no peripheral edema  Abdomen: soft and NT, no HSM,  BS normal  Musculoskeletal: No deformities, no cyanosis or clubbing  Neuro: alert, non focal  Skin: Warm, no lesions or  rashes Foot exam normal  Lab Results  Component Value Date   HGBA1C >15 05/24/2020         Assessment & Plan:  I personally reviewed all images and lab data in the [facility] system as well as any outside material available during this office visit and agree with the  radiology impressions.   Type 2 diabetes mellitus with hyperglycemia, without long-term current use of insulin ([facility]) We will continue glipizide and metformin for now and add Trulicity 75 mcg weekly Will get patient assistance for the Trulicity and have her meet with our clinical pharmacist for training on Trulicity  We were not able to draw blood on the MMU today so we will obtain metabolic panel blood counts lipid panel when she comes to the clinic to meet with clinical pharmacy and pick up her Trulicity and her atorvastatin in a week  Will begin atorvastatin in advance of the lipid panel  Patient given a healthy diet recommendation  Patient issued new glucose testing meters and supplies  Morbid obesity with BMI of 70 and over, adult [facility]) Patient given dietary education  Homelessness Patient currently homeless but in the [employer] with a good support system   Krista White was seen today for diabetes.  Diagnoses and all orders for this visit:  Type 2 diabetes mellitus with hyperglycemia, without long-term current use of insulin (HCC) -     Microalbumin / creatinine urine ratio -     Cancel: Hemoglobin A1c -     CBC with Differential/Platelet; Future -     Lipid Panel -     Comprehensive metabolic panel -     POCT glucose (manual entry) -     Blood Glucose Monitoring Suppl (TRUE METRIX METER) w/Device KIT; Use to check blood sugar -     glucose blood (TRUE METRIX BLOOD GLUCOSE TEST) test strip; Use as instructed -     TRUEplus Lancets 28G MISC; Use to check blood sugar -  Microalbumin / creatinine urine ratio -     POCT glycosylated hemoglobin (Hb A1C)  Need for hepatitis C screening test -     HCV Ab w  Reflex to Quant PCR  Morbid obesity with BMI of 70 and over, adult [facility])  Homelessness  Other orders -     glipiZIDE (GLUCOTROL XL) 10 MG 24 hr tablet; Take 1 tablet (10 mg total) by mouth daily with breakfast. -     metFORMIN (GLUCOPHAGE) 500 MG tablet; Take 2 tablets (1,000 mg total) by mouth 2 (two) times daily with a meal. -     Dulaglutide (TRULICITY) 5.70 VX/7.9TJ SOPN; Inject 0.75 mg into the skin once a week. -     atorvastatin (LIPITOR) 20 MG tablet; Take 1 tablet (20 mg total) by mouth daily.  Also check hepatitis viral C panel  I spent 35 minutes with this patient including chart review interview exam patient education and formulating a plan of care which was moderate and this medical decision making

## 2020-05-24 NOTE — Assessment & Plan Note (Signed)
Patient given dietary education

## 2020-05-25 LAB — MICROALBUMIN / CREATININE URINE RATIO
Creatinine, Urine: 157.1 mg/dL
Microalb/Creat Ratio: 18 mg/g creat (ref 0–29)
Microalbumin, Urine: 27.6 ug/mL

## 2020-05-31 ENCOUNTER — Other Ambulatory Visit: Payer: Self-pay

## 2020-05-31 ENCOUNTER — Ambulatory Visit: Payer: Self-pay | Attending: Family Medicine | Admitting: Pharmacist

## 2020-05-31 DIAGNOSIS — E1165 Type 2 diabetes mellitus with hyperglycemia: Secondary | ICD-10-CM

## 2020-05-31 NOTE — Progress Notes (Signed)
Patient was educated on the use of the Trulicity pen. Reviewed necessary supplies and operation of the pen. Also reviewed goal blood glucose levels. Patient was able to demonstrate use. All questions and concerns were addressed.  Of note, she is not fasting today. Will have her return to see me in 2 weeks for a full DM visit and labs.   Time spent face-to-face counseling: 15 minutes Follow-up in 2 weeks.  Benard Halsted, PharmD, [facility], [place] [phone]

## 2020-06-14 ENCOUNTER — Ambulatory Visit: Payer: Self-pay | Attending: Critical Care Medicine | Admitting: Pharmacist

## 2020-06-14 ENCOUNTER — Other Ambulatory Visit: Payer: Self-pay

## 2020-06-14 ENCOUNTER — Encounter: Payer: Self-pay | Admitting: Pharmacist

## 2020-06-14 DIAGNOSIS — E1165 Type 2 diabetes mellitus with hyperglycemia: Secondary | ICD-10-CM

## 2020-06-14 LAB — GLUCOSE, POCT (MANUAL RESULT ENTRY): POC Glucose: 136 mg/dl — AB (ref 70–99)

## 2020-06-14 MED ORDER — GLIPIZIDE ER 5 MG PO TB24
5.0000 mg | ORAL_TABLET | Freq: Every day | ORAL | 2 refills | Status: DC
  Filled 2020-06-14: qty 30, 30d supply, fill #0

## 2020-06-14 NOTE — Patient Instructions (Signed)
Thank you for coming to see me today. Please do the following:  1. Stop the glipizide you take now.  2. I sent a prescription for the lower dose of glipizide. Pick it up and take it instead of the glipizide you've been taking.  3. Continue metformin.  4. Continue Trulicity.  5. Continue checking blood sugars at home. It's really important that you record these and bring these in to your next doctor's appointment. If you get in readings above 500 or lower than 70, call me or the clinic to let your doctor know. See below on how to treat low blood sugar.  6. Continue making the lifestyle changes we've discussed together during our visit. Diet and exercise play a significant role in improving your blood sugars.  7. Follow-up with me in 2 weeks.    Hypoglycemia or low blood sugar:   Low blood sugar can happen quickly and may become an emergency if not treated right away.   While this shouldn't happen often, it can be brought upon if you skip a meal or do not eat enough. Also, if your insulin or other diabetes medications are dosed too high, this can cause your blood sugar to go to low.   Warning signs of low blood sugar include: 1. Feeling shaky or dizzy 2. Feeling weak or tired  3. Excessive hunger 4. Feeling anxious or upset  5. Sweating even when you aren't exercising  What to do if I experience low blood sugar? 1. Check your blood sugar with your meter. If lower than 70, proceed to step 2.  2. Treat with 3-4 glucose tablets or 3 packets of regular sugar. If these aren't around, you can try hard candy. Yet another option would be to drink 4 ounces of fruit juice or 6 ounces of REGULAR soda.  3. Re-check your sugar in 15 minutes. If it is still below 70, do what you did in step 2 again. If has come back up, go ahead and eat a snack or small meal at this time.

## 2020-06-14 NOTE — Progress Notes (Signed)
    S:     No chief complaint on file.  Patient arrives in good spirits.  Presents for diabetes evaluation, education, and management Patient was referred and last seen by Primary Care Provider on 05/24/2020.    Patient reports Diabetes was diagnosed within the last couple of years. Tolerating Trulicity well. Denies NV, abdominal pain. No ACS/CAD, CKD, or CHF hx.   Family/Social History:  FHx: DM Tobacco: never smoker  Alcohol: none  Insurance coverage/medication affordability: self pay  Medication adherence reported.   Current diabetes medications include: dulaglutide 0.75 mg weekly, glipizide 10 mg XL daily, metformin 500 mg - takes 2 tablets (1037m total) BID.   Patient reports hypoglycemic awareness but is not checking her sugars when this occurs. Endorses dizziness. Txs successfully   Patient reported dietary habits: - Admits to struggling with pasta and bread   Patient-reported exercise habits:  - Walks daily    Patient denies nocturia (nighttime urination).  Patient denies neuropathy (nerve pain). Patient denies visual changes. Patient reports self foot exams.     O:  POCT: 136   Lab Results  Component Value Date   HGBA1C >15 05/24/2020   There were no vitals filed for this visit.  Lipid Panel  No results found for: CHOL, TRIG, HDL, CHOLHDL, VLDL, LDLCALC, LDLDIRECT  Home fasting blood sugars: not checking   2 hour post-meal/random blood sugars: not checking .   Clinical Atherosclerotic Cardiovascular Disease (ASCVD): No  The ASCVD Risk score (Mikey BussingDC Jr., et al., 2013) failed to calculate for the following reasons:   Cannot find a previous HDL lab   Cannot find a previous total cholesterol lab    A/P: Diabetes longstanding currently uncontrolled but seems to be improving. Patient is able to verbalize appropriate hypoglycemia management plan. Medication adherence appears optimal however pt is not checking home CBGs.  -Continued metformin, Trulicity.   -Decrease glipizide to 5 mg XL daily d/t relative hypoglycemia. We may be able to d/c this at follow-up when we increase Trulicity.  -Encouraged pt to check home CBGs.  -Extensively discussed pathophysiology of diabetes, recommended lifestyle interventions, dietary effects on blood sugar control -Counseled on s/sx of and management of hypoglycemia -Next A1C anticipated 08/2020.  -CMP14+eGFR -Lipid  Written patient instructions provided.  Total time in face to face counseling 30 minutes.   Follow up Pharmacist Clinic Visit in 2 weeks for Trulicity titration.     LBenard Halsted PharmD, [facility] [phone]

## 2020-06-15 LAB — CMP14+EGFR
ALT: 20 IU/L (ref 0–32)
AST: 18 IU/L (ref 0–40)
Albumin/Globulin Ratio: 1.2 (ref 1.2–2.2)
Albumin: 4.1 g/dL (ref 3.8–4.8)
Alkaline Phosphatase: 136 IU/L — ABNORMAL HIGH (ref 44–121)
BUN/Creatinine Ratio: 19 (ref 9–23)
BUN: 13 mg/dL (ref 6–24)
Bilirubin Total: <0.2 mg/dL (ref 0.0–1.2)
CO2: 23 mmol/L (ref 20–29)
Calcium: 9.5 mg/dL (ref 8.7–10.2)
Chloride: 103 mmol/L (ref 96–106)
Creatinine, Ser: 0.68 mg/dL (ref 0.57–1.00)
Globulin, Total: 3.3 g/dL (ref 1.5–4.5)
Glucose: 104 mg/dL — ABNORMAL HIGH (ref 65–99)
Potassium: 4.5 mmol/L (ref 3.5–5.2)
Sodium: 142 mmol/L (ref 134–144)
Total Protein: 7.4 g/dL (ref 6.0–8.5)
eGFR: 112 mL/min/{1.73_m2} (ref >59)

## 2020-06-15 LAB — LIPID PANEL
Chol/HDL Ratio: 4.6 ratio — ABNORMAL HIGH (ref 0.0–4.4)
Cholesterol, Total: 181 mg/dL (ref 100–199)
HDL: 39 mg/dL — ABNORMAL LOW (ref >39)
LDL Chol Calc (NIH): 117 mg/dL — ABNORMAL HIGH (ref 0–99)
Triglycerides: 141 mg/dL (ref 0–149)
VLDL Cholesterol Cal: 25 mg/dL (ref 5–40)

## 2020-06-21 ENCOUNTER — Other Ambulatory Visit: Payer: Self-pay

## 2020-06-29 ENCOUNTER — Ambulatory Visit: Payer: Self-pay | Attending: Critical Care Medicine | Admitting: Pharmacist

## 2020-06-29 ENCOUNTER — Encounter: Payer: Self-pay | Admitting: Pharmacist

## 2020-06-29 ENCOUNTER — Other Ambulatory Visit: Payer: Self-pay

## 2020-06-29 DIAGNOSIS — E1165 Type 2 diabetes mellitus with hyperglycemia: Secondary | ICD-10-CM

## 2020-06-29 LAB — GLUCOSE, POCT (MANUAL RESULT ENTRY): POC Glucose: 158 mg/dl — AB (ref 70–99)

## 2020-06-29 MED ORDER — TRULICITY 1.5 MG/0.5ML ~~LOC~~ SOAJ
1.5000 mg | SUBCUTANEOUS | 4 refills | Status: DC
  Filled 2020-06-29: qty 2, 28d supply, fill #0
  Filled 2020-08-05: qty 2, 28d supply, fill #1

## 2020-06-29 NOTE — Progress Notes (Signed)
    S:     No chief complaint on file.  Patient arrives in good spirits.  Presents for diabetes evaluation, education, and management Patient was referred and last seen by Primary Care Provider on 05/24/2020. I last saw her on 06/14/2020 and decreased glipizide dose d/t hypoglycemia.  Today, pt tells me she stopped glipizide. Endorses compliance with metformin and Trulicity. Tolerating Trulicity well. Denies NV, abdominal pain. Endorses occasional diarrhea with metformin but experiences this when she eats fruit with high fiber content.   Family/Social History:  FHx: DM Tobacco: never smoker  Alcohol: none  Insurance coverage/medication affordability: self pay  Medication adherence reported.   Current diabetes medications include: dulaglutide 0.75 mg weekly, glipizide 5 mg XL daily (not taking), metformin 500 mg - takes 2 tablets (1000mg  total) BID.   Patient denies hypoglycemia.    Patient reported dietary habits: - Admits to struggling with pasta and bread   Patient-reported exercise habits:  - Walks daily    Patient denies nocturia (nighttime urination).  Patient endorses neuropathy (nerve pain). Patient denies visual changes. Patient reports self foot exams.     O:  POCT: 158 (~2 hrs post prandial)  Lab Results  Component Value Date   HGBA1C >15 05/24/2020   There were no vitals filed for this visit.  Lipid Panel     Component Value Date/Time   CHOL 181 06/14/2020 1449   TRIG 141 06/14/2020 1449   HDL 39 (L) 06/14/2020 1449   CHOLHDL 4.6 (H) 06/14/2020 1449   LDLCALC 117 (H) 06/14/2020 1449   Home fasting blood sugars: not checking as much  2 hour post-meal/random blood sugars: 130s - 190s  Clinical Atherosclerotic Cardiovascular Disease (ASCVD): No  The 10-year ASCVD risk score Mikey Bussing DC Jr., et al., 2013) is: 4.9%   Values used to calculate the score:     Age: 64 years     Sex: Female     Is Non-Hispanic African American: Yes     Diabetic: Yes      Tobacco smoker: No     Systolic Blood Pressure: 109 mmHg     Is BP treated: No     HDL Cholesterol: 39 mg/dL     Total Cholesterol: 181 mg/dL    A/P: Diabetes longstanding currently uncontrolled but seems to be improving. Patient is able to verbalize appropriate hypoglycemia management plan. Medication adherence appears optimal with Trulicity and metformin. She stopped the glipizide. I recommend to discontinue this completely and inc Trulicity dose.   -Continued metformin 1000 mg BID (takes as two 500 mg tablets BID).  -Increase TRulicity to 1.5 mg weekly.  -Stop glipizide.  -Will message PCP regarding neuropathy - possible gabapentin start.  -Encouraged pt to check home CBGs.  -Extensively discussed pathophysiology of diabetes, recommended lifestyle interventions, dietary effects on blood sugar control -Counseled on s/sx of and management of hypoglycemia -Next A1C anticipated 08/2020.   Written patient instructions provided.  Total time in face to face counseling 30 minutes.   Follow up PCP Clinic Visit 07/18/2020.  Benard Halsted, PharmD, [facility] [place], [place] [phone]

## 2020-07-16 ENCOUNTER — Other Ambulatory Visit (HOSPITAL_BASED_OUTPATIENT_CLINIC_OR_DEPARTMENT_OTHER): Payer: Self-pay

## 2020-07-18 ENCOUNTER — Ambulatory Visit: Payer: Self-pay | Admitting: Critical Care Medicine

## 2020-07-18 NOTE — Progress Notes (Deleted)
Subjective:    Patient ID: Krista White, female    DOB: 03-25-1980, 40 y.o.   MRN: [id number]  40 y.o.F with T2DM. Obesity   BS 527 at [place] visit yesterday  05/24/2020 This is a former primary care patient of Dr. Chapman Fitch last seen in September 2021.  Patient now is in the [facility] shelter since 14 April.  She lost her housing because she had difficulty with family relations.  She is had diabetes diagnosed for 2 years had morbid obesity for some time with a BMI of greater than 60.  On arrival blood pressure was 140/99.  Yesterday in the clinic at the [facility] her blood sugar was over 500.  On arrival it is today 60 with an A1c greater than 15  At the last office visit in September the documentation was as below Type 2 diabetes mellitus with hyperglycemia, without long-term current use of insulin ([place]) She reports that she ate just prior to today's visit and had chicken with vegetables but also drink lemonade.  She has not been taking the previously prescribed glipizide.  Hemoglobin A1c at her visit on 08/05/2018 was elevated at 13.1.  She has taken her Metformin morning dose.  Glucose was elevated at 440 at today's visit.  Discussed with the patient that she will be given some short acting insulin x1 here in the office to help improve her blood sugars as sometimes the oral medications are not very effective when blood glucose is very high.  She reports that she does not have a home glucometer.  Offer was made to send 1 here to the pharmacy but patient states that she will pick up her medicines at [facility] and therefore she is encouraged to find out from the pharmacist at [facility] which glucometer is cheapest and most effective and also obtain test strips to start testing her blood sugars on a daily basis.  Information on diabetes basics and diabetes action plan provided as part of AVS.  She has been asked to record her home blood sugars and bring blood sugar diary as well as  glucometer when she returns in the next 2 to 3 weeks for a visit with the clinical pharmacist.  New prescriptions are provided for her Metformin at 1000 mg twice daily as well as new prescription for glipizide at 10 mg once daily with the first meal of the day.  She should call or return sooner if blood sugars are remaining above 250 fasting.  Urinalysis did not show any presence of ketones.  She has lost a significant amount of weight and is encouraged to continue efforts at weight loss. - POCT glucose (manual entry) - POCT URINALYSIS DIP (CLINITEK) - insulin aspart (novoLOG) injection 12 Units - Basic Metabolic Panel - POCT glycosylated hemoglobin (Hb A1C) - metFORMIN (GLUCOPHAGE) 1000 MG tablet; Take 1 tablet (1,000 mg total) by mouth 2 (two) times daily with a meal. Take with food  Dispense: 60 tablet; Refill: 3 - glipiZIDE (GLUCOTROL XL) 10 MG 24 hr tablet; Take 1 tablet (10 mg total) by mouth daily with breakfast. Or before first meal  Dispense: 30 tablet; Refill: 2 - POCT glucose (manual entry)   2. Itching in the vaginal area She has complaint of itching in the vaginal area and white clumpy discharge.  Discussed with the patient that elevated blood sugars increase the risk for yeast infections.  Prescription has been sent to her pharmacy for fluconazole 150 mg x 1 which she will  then repeat in 3 days.  She should call or return if her symptoms do not improve. - fluconazole (DIFLUCAN) 150 MG tablet; Take one pill by mouth then take second pill in 3 days  Dispense: 2 tablet; Refill: 2   3. Need for immunization against influenza She has agreed to have influenza immunization at today's visit which will be given along with educational material on influenza immunization.  Patient is received all of her COVID vaccinations.  She is in need of a foot exam and urine microalbumin.  She has no other primary care gaps and she will need to be reassigned to primary care at [facility]  Patient  has no other specific complaints on review of systems at this time except for polyuria and polydipsia   07/18/2020 Saw Lurena Joiner now of glipizide,  on high dose trulicity and metformin  e 2 diabetes mellitus with hyperglycemia, without long-term current use of insulin ([place]) We will continue glipizide and metformin for now and add Trulicity 75 mcg weekly Will get patient assistance for the Trulicity and have her meet with our clinical pharmacist for training on Trulicity  We were not able to draw blood on the MMU today so we will obtain metabolic panel blood counts lipid panel when she comes to the clinic to meet with clinical pharmacy and pick up her Trulicity and her atorvastatin in a week  Will begin atorvastatin in advance of the lipid panel  Patient given a healthy diet recommendation  Patient issued new glucose testing meters and supplies  Morbid obesity with BMI of 70 and over, adult [facility]) Patient given dietary education  Homelessness Patient currently homeless but in the [employer] with a good support system   Past Medical History:  Diagnosis Date   Anemia    Diabetes mellitus without complication ([place])    pt states she was pre diabetic but is no longer. Takes no meds and does not check her blood sugar.    Endometrial cancer ([place])    Endometrial cancer ([place]) 02/21/2015   IBS (irritable bowel syndrome)    Nocturia    Obesity    Tingling    right hand     Family History  Problem Relation Age of Onset   Ovarian cancer Mother    Diabetes Father    Heart attack Father    Throat cancer Paternal Uncle    Diabetes Paternal Aunt    Diabetes Maternal Uncle      Social History   Socioeconomic History   Marital status: Single    Spouse name: Not on file   Number of children: 0   Years of education: Not on file   Highest education level: Not on file  Occupational History    Employer: [employer]  Tobacco Use   Smoking status: Never   Smokeless tobacco: Never   Vaping Use   Vaping Use: Never used  Substance and Sexual Activity   Alcohol use: No    Alcohol/week: 0.0 standard drinks   Drug use: No   Sexual activity: Not on file  Other Topics Concern   Not on file  Social History Narrative   Not on file   Social Determinants of Health   Financial Resource Strain: Not on file  Food Insecurity: Not on file  Transportation Needs: Not on file  Physical Activity: Not on file  Stress: Not on file  Social Connections: Not on file  Intimate Partner Violence: Not on file     Allergies  Allergen Reactions   Asa [Aspirin] Hives   Latex Itching   Pineapple     Mouth itches   Lactase Diarrhea     Outpatient Medications Prior to Visit  Medication Sig Dispense Refill   atorvastatin (LIPITOR) 20 MG tablet Take 1 tablet (20 mg total) by mouth daily. 90 tablet 3   Blood Glucose Monitoring Suppl (TRUE METRIX METER) w/Device KIT Use to check blood sugar 1 kit 0   Dulaglutide (TRULICITY) 1.5 BW/4.6KZ SOPN Inject 1.5 mg into the skin once a week. 2 mL 4   glucose blood (TRUE METRIX BLOOD GLUCOSE TEST) test strip Use as instructed 100 each 12   metFORMIN (GLUCOPHAGE) 500 MG tablet Take 2 tablets (1,000 mg total) by mouth 2 (two) times daily with a meal. 120 tablet 3   TRUEplus Lancets 28G MISC Use to check blood sugar 100 each 1   No facility-administered medications prior to visit.      Review of Systems  Constitutional:  Negative for fatigue.  HENT: Negative.    Respiratory: Negative.    Cardiovascular: Negative.   Gastrointestinal: Negative.   Endocrine: Positive for polydipsia and polyuria. Negative for polyphagia.  Genitourinary:  Positive for frequency. Negative for decreased urine volume, dysuria, urgency, vaginal bleeding, vaginal discharge and vaginal pain.  Musculoskeletal: Negative.   Neurological: Negative.   Psychiatric/Behavioral: Negative.  Negative for self-injury and suicidal ideas.       Objective:   Physical  Exam There were no vitals filed for this visit.   Gen: Pleasant, morbidly obese, in no distress,  normal affect  ENT: No lesions,  mouth clear,  oropharynx clear, no postnasal drip  Neck: No JVD, no TMG, no carotid bruits  Lungs: No use of accessory muscles, no dullness to percussion, clear without rales or rhonchi  Cardiovascular: RRR, heart sounds normal, no murmur or gallops, no peripheral edema  Abdomen: soft and NT, no HSM,  BS normal  Musculoskeletal: No deformities, no cyanosis or clubbing  Neuro: alert, non focal  Skin: Warm, no lesions or rashes Foot exam normal  Lab Results  Component Value Date   HGBA1C >15 05/24/2020         Assessment & Plan:  I personally reviewed all images and lab data in the [facility] system as well as any outside material available during this office visit and agree with the  radiology impressions.   No problem-specific Assessment & Plan notes found for this encounter.   There are no diagnoses linked to this encounter. Also check hepatitis viral C panel  I spent 35 minutes with this patient including chart review interview exam patient education and formulating a plan of care which was moderate and this medical decision making

## 2020-08-07 ENCOUNTER — Other Ambulatory Visit: Payer: Self-pay | Admitting: Critical Care Medicine

## 2020-08-07 ENCOUNTER — Other Ambulatory Visit: Payer: Self-pay

## 2020-08-07 MED ORDER — ATORVASTATIN CALCIUM 20 MG PO TABS: 20.0000 mg | ORAL_TABLET | Freq: Every day | ORAL | 2 refills | Status: DC

## 2020-08-07 MED ORDER — TRULICITY 1.5 MG/0.5ML ~~LOC~~ SOAJ: 1.5000 mg | SUBCUTANEOUS | 1 refills | Status: DC

## 2020-08-07 MED ORDER — METFORMIN HCL 500 MG PO TABS: 1000.0000 mg | ORAL_TABLET | Freq: Two times a day (BID) | ORAL | 1 refills | Status: DC

## 2020-08-07 NOTE — Telephone Encounter (Signed)
Medication: Rx #: [id number] Dulaglutide (TRULICITY) 1.5 TM/9.3JP SOPN [[id number]] , Rx #: [id number] metFORMIN (GLUCOPHAGE) 500 MG tablet [[id number]] , Rx #: [id number] atorvastatin (LIPITOR) 20 MG tablet [[id number]]    Has the patient contacted their pharmacy? YES (Agent: If no, request that the patient contact the pharmacy for the refill.) (Agent: If yes, when and what did the pharmacy advise?)  Preferred Pharmacy (with phone number or street name): [place], [address] [place]. [place] [address] Phone: [phone] Fax: [phone] Hours: Not open 24 hours    Agent: Please be advised that RX refills may take up to 3 business days. We ask that you follow-up with your pharmacy.

## 2020-08-07 NOTE — Telephone Encounter (Signed)
Change of pharmacy Requested Prescriptions  Pending Prescriptions Disp Refills  . Dulaglutide (TRULICITY) 1.5 OI/7.1IW SOPN 2 mL 1    Sig: Inject 1.5 mg into the skin once a week.     Endocrinology:  Diabetes - GLP-1 Receptor Agonists Failed - 08/07/2020  4:03 PM      Failed - HBA1C is between 0 and 7.9 and within 180 days    HbA1c POC (<> result, manual entry)  Date Value Ref Range Status  05/24/2020 >15 4.0 - 5.6 % Final         Passed - Valid encounter within last 6 months    Recent Outpatient Visits          1 month ago Type 2 diabetes mellitus with hyperglycemia, without long-term current use of insulin ([place])   [place], Annie Main L, RPH-CPP   1 month ago Type 2 diabetes mellitus with hyperglycemia, without long-term current use of insulin ([place])   [place], Jarome Matin, RPH-CPP   2 months ago Type 2 diabetes mellitus with hyperglycemia, without long-term current use of insulin ([place])   [place], Annie Main L, RPH-CPP   9 months ago Type 2 diabetes mellitus with hyperglycemia, without long-term current use of insulin ([place])   [place] Fulp, [place], MD   1 year ago Type 2 diabetes mellitus with hyperglycemia, without long-term current use of insulin ([place])   [place] Fulp, [place], MD             . metFORMIN (GLUCOPHAGE) 500 MG tablet 120 tablet 1    Sig: Take 2 tablets (1,000 mg total) by mouth 2 (two) times daily with a meal.     Endocrinology:  Diabetes - Biguanides Failed - 08/07/2020  4:03 PM      Failed - HBA1C is between 0 and 7.9 and within 180 days    HbA1c POC (<> result, manual entry)  Date Value Ref Range Status  05/24/2020 >15 4.0 - 5.6 % Final         Passed - Cr in normal range and within 360 days    Creatinine, Ser  Date Value Ref Range Status  06/14/2020 0.68 0.57 -  1.00 mg/dL Final         Passed - AA eGFR in normal range and within 360 days    GFR calc Af Amer  Date Value Ref Range Status  10/28/2019 104 >59 mL/min/1.73 Final    Comment:    **[facility] currently reports eGFR in compliance with the current**   recommendations of the [employer]. [facility] will   update reporting as new guidelines are published from the NKF-ASN   Task force.    GFR calc non Af Amer  Date Value Ref Range Status  10/28/2019 90 >59 mL/min/1.73 Final   GFR  Date Value Ref Range Status  11/24/2017 114.30 >60.00 mL/min Final   eGFR  Date Value Ref Range Status  06/14/2020 112 >59 mL/min/1.73 Final         Passed - Valid encounter within last 6 months    Recent Outpatient Visits          1 month ago Type 2 diabetes mellitus with hyperglycemia, without long-term current use of insulin [facility])   [place], Annie Main L, RPH-CPP   1 month ago Type 2  diabetes mellitus with hyperglycemia, without long-term current use of insulin [facility])   [place], Jarome Matin, RPH-CPP   2 months ago Type 2 diabetes mellitus with hyperglycemia, without long-term current use of insulin [facility])   [place], Annie Main L, RPH-CPP   9 months ago Type 2 diabetes mellitus with hyperglycemia, without long-term current use of insulin ([place])   [place] Fulp, [place], MD   1 year ago Type 2 diabetes mellitus with hyperglycemia, without long-term current use of insulin ([place])   [place] Fulp, [place], MD             . atorvastatin (LIPITOR) 20 MG tablet 90 tablet 2    Sig: Take 1 tablet (20 mg total) by mouth daily.     Cardiovascular:  Antilipid - Statins Failed - 08/07/2020  4:03 PM      Failed - LDL in normal range and within 360 days    LDL Chol Calc (NIH)  Date Value Ref Range Status   06/14/2020 117 (H) 0 - 99 mg/dL Final         Failed - HDL in normal range and within 360 days    HDL  Date Value Ref Range Status  06/14/2020 39 (L) >39 mg/dL Final         Passed - Total Cholesterol in normal range and within 360 days    Cholesterol, Total  Date Value Ref Range Status  06/14/2020 181 100 - 199 mg/dL Final         Passed - Triglycerides in normal range and within 360 days    Triglycerides  Date Value Ref Range Status  06/14/2020 141 0 - 149 mg/dL Final         Passed - Patient is not pregnant      Passed - Valid encounter within last 12 months    Recent Outpatient Visits          1 month ago Type 2 diabetes mellitus with hyperglycemia, without long-term current use of insulin ([place])   [place], Stephen L, RPH-CPP   1 month ago Type 2 diabetes mellitus with hyperglycemia, without long-term current use of insulin [facility])   [place], Annie Main L, RPH-CPP   2 months ago Type 2 diabetes mellitus with hyperglycemia, without long-term current use of insulin [facility])   [place], Annie Main L, RPH-CPP   9 months ago Type 2 diabetes mellitus with hyperglycemia, without long-term current use of insulin ([place])   [place] Fulp, [place], MD   1 year ago Type 2 diabetes mellitus with hyperglycemia, without long-term current use of insulin ([place])   [facility] [place], [place], MD

## 2020-08-08 ENCOUNTER — Other Ambulatory Visit: Payer: Self-pay

## 2020-08-14 ENCOUNTER — Other Ambulatory Visit: Payer: Self-pay

## 2020-08-14 MED ORDER — METFORMIN HCL 500 MG PO TABS
1000.0000 mg | ORAL_TABLET | Freq: Two times a day (BID) | ORAL | 1 refills | Status: DC
  Filled 2020-08-14: qty 120, 30d supply, fill #0
  Filled 2020-12-14: qty 120, 30d supply, fill #1

## 2020-08-14 MED ORDER — TRULICITY 1.5 MG/0.5ML ~~LOC~~ SOAJ
1.5000 mg | SUBCUTANEOUS | 1 refills | Status: DC
  Filled 2020-08-14: qty 2, 28d supply, fill #0
  Filled 2020-09-09: qty 2, 28d supply, fill #1

## 2020-08-14 MED ORDER — ATORVASTATIN CALCIUM 20 MG PO TABS
20.0000 mg | ORAL_TABLET | Freq: Every day | ORAL | 2 refills | Status: DC
  Filled 2020-08-14: qty 30, 30d supply, fill #0

## 2020-09-10 ENCOUNTER — Other Ambulatory Visit: Payer: Self-pay

## 2020-09-11 ENCOUNTER — Other Ambulatory Visit: Payer: Self-pay

## 2020-12-13 ENCOUNTER — Ambulatory Visit (HOSPITAL_COMMUNITY)
Admission: EM | Admit: 2020-12-13 | Discharge: 2020-12-13 | Disposition: A | Discharge status: Home / Self Care | Payer: BC Managed Care – PPO | Attending: Student | Admitting: Student

## 2020-12-13 ENCOUNTER — Encounter (HOSPITAL_COMMUNITY): Payer: Self-pay | Admitting: Emergency Medicine

## 2020-12-13 ENCOUNTER — Other Ambulatory Visit: Payer: Self-pay

## 2020-12-13 DIAGNOSIS — Z7984 Long term (current) use of oral hypoglycemic drugs: Secondary | ICD-10-CM

## 2020-12-13 DIAGNOSIS — E1165 Type 2 diabetes mellitus with hyperglycemia: Secondary | ICD-10-CM | POA: Diagnosis not present

## 2020-12-13 LAB — CBG MONITORING, ED: Glucose-Capillary: 399 mg/dL — ABNORMAL HIGH (ref 70–99)

## 2020-12-13 MED ORDER — GLIPIZIDE 5 MG PO TABS: 5.0000 mg | ORAL_TABLET | Freq: Every day | ORAL | 0 refills | Status: DC

## 2020-12-13 NOTE — Discharge Instructions (Addendum)
-  Glipizide once daily -Continue Metformin twice daily -Call your PCP tomorrow to schedule a follow-up!!! -If you develop these symptoms, head to the ED immediately. You may have developed a condition called DKA, which is life threatening:  Being very thirsty Urinating often Feeling a need to throw up and throwing up Having stomach pain Being weak or tired Being short of breath Having fruity-scented breath Being confused

## 2020-12-13 NOTE — ED Provider Notes (Signed)
[facility]    CSN: [id number] Arrival date & time: 12/13/20  1549      History   Chief Complaint Chief Complaint  Patient presents with   Fatigue    HPI Krista White is a 40 y.o. female presenting with fatigue, vomiting x1 episode. Medical history diabetes. Diabetes was previously well controlled on trulicity and metformin but she stopped the Trulicity 3 months ago as she cannot afford this.  Describes 1 week of fatigue, nausea with 1 episode of vomiting earlier today that has resolved.  Occasional watery diarrhea.  Denies abdominal pain.  Feeling well otherwise, denies cough, congestion, fever/chills, dizziness, weakness, chest pain, shortness of breath.  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 Takes no meds and does not check her blood sugar.    Endometrial cancer ([place])    Endometrial cancer ([place]) 02/21/2015   IBS (irritable bowel syndrome)    Nocturia    Obesity    Tingling    right hand    Patient Active Problem List   Diagnosis Date Noted   Type 2 diabetes mellitus with hyperglycemia, without long-term current use of insulin ([place]) 05/24/2020   Homelessness 05/24/2020   Morbid obesity with BMI of 70 and over, adult ([place]) 02/21/2015    Past Surgical History:  Procedure Laterality Date   COLONOSCOPY WITH PROPOFOL N/A 02/14/2016   Procedure: COLONOSCOPY WITH PROPOFOL;  Surgeon: Milus Banister, MD;  Location: [facility];  Service: Endoscopy;  Laterality: N/A;   HYSTEROSCOPY WITH D & C N/A 01/30/2015   Procedure: DILATATION AND CURETTAGE /HYSTEROSCOPY;  Surgeon: Azucena Fallen, MD;  Location: [facility];  Service: Gynecology;  Laterality: N/A;   ROBOTIC ASSISTED TOTAL HYSTERECTOMY WITH BILATERAL  SALPINGO OOPHERECTOMY N/A 03/22/2015   Procedure: XI ROBOTIC ASSISTED TOTAL ABDOMINAL HYSTERECTOMY FOR UTERUS GREATER THAN [address] WITH BILATERAL SALPINGO OOPHORECTOMY;  Surgeon: Everitt Amber, MD;  Location: [facility];  Service: Gynecology;  Laterality: N/A;   TYMPANOSTOMY TUBE PLACEMENT     bilat     OB History   No obstetric history on file.      Home Medications    Prior to Admission medications   Medication Sig Start Date End Date Taking? Authorizing Provider  glipiZIDE (GLUCOTROL) 5 MG tablet Take 1 tablet (5 mg total) by mouth daily before breakfast. 12/13/20 01/12/21 Yes Hazel Sams, PA-C  atorvastatin (LIPITOR) 20 MG tablet Take 1 tablet (20 mg total) by mouth daily. Patient not taking: Reported on 12/13/2020 08/14/20   Elsie Stain, MD  Blood Glucose Monitoring Suppl (TRUE METRIX METER) w/Device KIT Use to check blood sugar 05/24/20   Elsie Stain, MD  Dulaglutide (TRULICITY) 1.5 XY/8.1VW SOPN Inject 1.5 mg into the skin once a week. Patient not taking: Reported on 12/13/2020 08/14/20   Elsie Stain, MD  glucose blood (TRUE METRIX BLOOD GLUCOSE TEST) test strip Use as instructed 05/24/20   Elsie Stain, MD  metFORMIN (GLUCOPHAGE) 500 MG tablet Take 2 tablets (1,000 mg total) by mouth 2 (two) times daily with a meal. 08/14/20   Elsie Stain, MD  TRUEplus Lancets 28G MISC Use to check blood sugar 05/24/20  Elsie Stain, MD    Family History Family History  Problem Relation Age of Onset   Ovarian cancer Mother    Diabetes Father    Heart attack Father    Throat cancer Paternal Uncle    Diabetes Paternal Aunt    Diabetes Maternal Uncle     Social History Social History   Tobacco Use   Smoking status: Never   Smokeless tobacco: Never  Vaping Use   Vaping Use: Never used  Substance Use Topics   Alcohol use: No    Alcohol/week: 0.0 standard drinks   Drug use: No     Allergies   Asa [aspirin], Latex, Pineapple, and Tilactase   Review of  Systems Review of Systems  Constitutional:  Positive for fatigue. Negative for appetite change, chills and fever.  HENT:  Negative for congestion, ear pain, rhinorrhea, sinus pressure, sinus pain and sore throat.   Eyes:  Negative for redness and visual disturbance.  Respiratory:  Negative for cough, chest tightness, shortness of breath and wheezing.   Cardiovascular:  Negative for chest pain and palpitations.  Gastrointestinal:  Negative for abdominal pain, constipation, diarrhea, nausea and vomiting.  Genitourinary:  Negative for dysuria, frequency and urgency.  Musculoskeletal:  Negative for myalgias.  Neurological:  Negative for dizziness, weakness and headaches.  Psychiatric/Behavioral:  Negative for confusion.   All other systems reviewed and are negative.   Physical Exam Triage Vital Signs ED Triage Vitals  Enc Vitals Group     BP 12/13/20 1653 (!) 165/101     Pulse Rate 12/13/20 1653 96     Resp 12/13/20 1653 (!) 24     Temp 12/13/20 1653 98.4 F (36.9 C)     Temp Source 12/13/20 1653 Oral     SpO2 12/13/20 1653 96 %     Weight --      Height --      Head Circumference --      Peak Flow --      Pain Score 12/13/20 1651 0     Pain Loc --      Pain Edu? --      Excl. in [place]? --    No data found.  Updated Vital Signs BP (!) 165/101 (BP Location: Left Arm) Comment (BP Location): regular cuff on left forearm  Pulse 96   Temp 98.4 F (36.9 C) (Oral)   Resp (!) 24   LMP 03/22/2015 Comment: has been spotting for a while  SpO2 96%   Visual Acuity Right Eye Distance:   Left Eye Distance:   Bilateral Distance:    Right Eye Near:   Left Eye Near:    Bilateral Near:     Physical Exam Vitals reviewed.  Constitutional:      General: She is not in acute distress.    Appearance: Normal appearance. She is not ill-appearing.  HENT:     Head: Normocephalic and atraumatic.     Right Ear: Tympanic membrane, ear canal and external ear normal. No tenderness. No middle ear  effusion. There is no impacted cerumen. Tympanic membrane is not perforated, erythematous, retracted or bulging.     Left Ear: Tympanic membrane, ear canal and external ear normal. No tenderness.  No middle ear effusion. There is no impacted cerumen. Tympanic membrane is not perforated, erythematous, retracted or bulging.     Nose: Nose normal. No congestion.     Mouth/Throat:     Mouth: Mucous membranes are moist.     Pharynx: Uvula  midline. No oropharyngeal exudate or posterior oropharyngeal erythema.  Eyes:     Extraocular Movements: Extraocular movements intact.     Pupils: Pupils are equal, round, and reactive to light.  Cardiovascular:     Rate and Rhythm: Normal rate and regular rhythm.     Heart sounds: Normal heart sounds.  Pulmonary:     Effort: Pulmonary effort is normal.     Breath sounds: Normal breath sounds. No decreased breath sounds, wheezing, rhonchi or rales.  Abdominal:     Palpations: Abdomen is soft.     Tenderness: There is no abdominal tenderness. There is no guarding or rebound.  Lymphadenopathy:     Cervical: No cervical adenopathy.     Right cervical: No superficial cervical adenopathy.    Left cervical: No superficial cervical adenopathy.  Neurological:     General: No focal deficit present.     Mental Status: She is alert and oriented to person, place, and time.     Comments: CN 2-12 grossly intact PERRLA, EOMI  Psychiatric:        Mood and Affect: Mood normal.        Behavior: Behavior normal.        Thought Content: Thought content normal.        Judgment: Judgment normal.     UC Treatments / Results  Labs (all labs ordered are listed, but only abnormal results are displayed) Labs Reviewed  CBG MONITORING, ED - Abnormal; Notable for the following components:      Result Value   Glucose-Capillary 399 (*)    All other components within normal limits    EKG   Radiology No results found.  Procedures Procedures (including critical care  time)  Medications Ordered in UC Medications - No data to display  Initial Impression / Assessment and Plan / UC Course  I have reviewed the triage vital signs and the nursing notes.  Pertinent labs & imaging results that were available during my care of the patient were reviewed by me and considered in my medical decision making (see chart for details).     This patient is a very pleasant 40 y.o. year old female presenting with hyperglycemia. Afebrile, nontachy. Negative for covid and influenza at work today. She stopped her trulicity 3 months ago due to cost, but is still taking metformin 1077m bid. Nonfasting CBG 399 today. Will send glipizide but advised her to call her PCP in 1 day for f/u and STRICT ED return precautions discussed. She does not regularly monitor her sugars at home, but states that she will also start doing this. Patient verbalizes understanding and agreement.    Final Clinical Impressions(s) / UC Diagnoses   Final diagnoses:  Uncontrolled type 2 diabetes mellitus with hyperglycemia ([place]     Discharge Instructions      -Glipizide once daily -Continue Metformin twice daily -Call your PCP tomorrow to schedule a follow-up!!! -If you develop these symptoms, head to the ED immediately. You may have developed a condition called DKA, which is life threatening:  Being very thirsty Urinating often Feeling a need to throw up and throwing up Having stomach pain Being weak or tired Being short of breath Having fruity-scented breath Being confused   ED Prescriptions     Medication Sig Dispense Auth. Provider   glipiZIDE (GLUCOTROL) 5 MG tablet Take 1 tablet (5 mg total) by mouth daily before breakfast. 30 tablet GHazel Sams PA-C      PDMP not reviewed this encounter.  Hazel Sams, PA-C 12/13/20 1722

## 2020-12-13 NOTE — ED Triage Notes (Signed)
Fatigue for one week. Vomited x 1 today.   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  Cbg was 400+.  Patient is diabetic.  Patient does not routinely check cbg.

## 2020-12-14 ENCOUNTER — Other Ambulatory Visit: Payer: Self-pay | Admitting: Critical Care Medicine

## 2020-12-14 ENCOUNTER — Other Ambulatory Visit: Payer: Self-pay

## 2020-12-14 MED ORDER — TRULICITY 1.5 MG/0.5ML ~~LOC~~ SOAJ
1.5000 mg | SUBCUTANEOUS | 1 refills | Status: DC
  Filled 2020-12-14: qty 2, 28d supply, fill #0

## 2020-12-14 NOTE — Telephone Encounter (Signed)
Requested medication (s) are due for refill today: yes   Requested medication (s) are on the active medication list: yes  Last refill:  08/14/20 #79ml 1 refills  Future visit scheduled: no  Notes to clinic:  [place]     Requested Prescriptions  Pending Prescriptions Disp Refills   Dulaglutide (TRULICITY) 1.5 QH/2.2VJ SOPN 2 mL 1    Sig: Inject 1.5 mg into the skin once a week.     Endocrinology:  Diabetes - GLP-1 Receptor Agonists Failed - 12/14/2020  8:54 AM      Failed - HBA1C is between 0 and 7.9 and within 180 days    HbA1c POC (<> result, manual entry)  Date Value Ref Range Status  05/24/2020 >15 4.0 - 5.6 % Final          Passed - Valid encounter within last 6 months    Recent Outpatient Visits           5 months ago Type 2 diabetes mellitus with hyperglycemia, without long-term current use of insulin [facility])   [place], Annie Main L, RPH-CPP   6 months ago Type 2 diabetes mellitus with hyperglycemia, without long-term current use of insulin [facility])   [place], Annie Main L, RPH-CPP   6 months ago Type 2 diabetes mellitus with hyperglycemia, without long-term current use of insulin [facility])   [place], Annie Main L, RPH-CPP   1 year ago Type 2 diabetes mellitus with hyperglycemia, without long-term current use of insulin ([place])   [place], [place], MD   2 years ago Type 2 diabetes mellitus with hyperglycemia, without long-term current use of insulin ([place])   [facility] [place], [place], MD

## 2020-12-16 NOTE — Telephone Encounter (Signed)
Carly call this pt and get her in with me face to face soon ok to double book

## 2020-12-17 ENCOUNTER — Other Ambulatory Visit: Payer: Self-pay

## 2020-12-18 ENCOUNTER — Other Ambulatory Visit: Payer: Self-pay

## 2020-12-20 ENCOUNTER — Encounter: Payer: Self-pay | Admitting: Critical Care Medicine

## 2020-12-20 MED ORDER — FLUCONAZOLE 100 MG PO TABS: ORAL_TABLET | ORAL | 0 refills | Status: DC

## 2021-01-15 ENCOUNTER — Encounter: Payer: Self-pay | Admitting: Critical Care Medicine

## 2021-01-15 DIAGNOSIS — U071 COVID-19: Secondary | ICD-10-CM

## 2021-01-15 HISTORY — DX: COVID-19: U07.1

## 2021-01-15 MED ORDER — NIRMATRELVIR/RITONAVIR (PAXLOVID)TABLET: 3.0000 | ORAL_TABLET | Freq: Two times a day (BID) | ORAL | 0 refills | Status: AC

## 2021-01-15 NOTE — Telephone Encounter (Signed)
Outpatient Oral COVID Treatment Note  I connected with Krista White on 01/15/2021/3:45 PM by telephone and verified that I am speaking with the correct person using two identifiers.  I discussed the limitations, risks, security, and privacy concerns of performing an evaluation and management service by telephone and the availability of in person appointments. I also discussed with the patient that there may be a patient responsible charge related to this service. The patient expressed understanding and agreed to proceed.  Patient location: [facility] Provider location: office  Diagnosis: COVID-19 infection  Purpose of visit: Discussion of potential use of Molnupiravir or Paxlovid, a new treatment for mild to moderate COVID-19 viral infection in non-hospitalized patients.   Subjective: Patient is a 40 y.o. female who has been diagnosed with COVID 19 viral infection.  Their symptoms began on 01/12/21  with hoarse, cough, fever .    Past Medical History:  Diagnosis Date   Anemia    Diabetes mellitus without complication ([place])    pt states she was pre diabetic but is no longer. Takes no meds and does not check her blood sugar.    Endometrial cancer ([place])    Endometrial cancer ([place]) 02/21/2015   IBS (irritable bowel syndrome)    Nocturia    Obesity    Tingling    right hand    Allergies  Allergen Reactions   Asa [Aspirin] Hives   Latex Itching   Pineapple     Mouth itches   Tilactase Diarrhea     Current Outpatient Medications:    nirmatrelvir/ritonavir EUA (PAXLOVID) 20 x 150 MG & 10 x 100MG TABS, Take 3 tablets by mouth 2 (two) times daily for 5 days. (Take nirmatrelvir 150 mg two tablets twice daily for 5 days and ritonavir 100 mg one tablet twice daily for 5 days) Patient GFR is 112, Disp: 30 tablet, Rfl: 0   Blood Glucose Monitoring Suppl (TRUE METRIX METER) w/Device KIT, Use to check blood sugar, Disp: 1 kit, Rfl: 0   Dulaglutide (TRULICITY) 1.5 HA/1.9FX SOPN, Inject 1.5 mg into  the skin once a week., Disp: 2 mL, Rfl: 1   glucose blood (TRUE METRIX BLOOD GLUCOSE TEST) test strip, Use as instructed, Disp: 100 each, Rfl: 12   metFORMIN (GLUCOPHAGE) 500 MG tablet, Take 2 tablets (1,000 mg total) by mouth 2 (two) times daily with a meal., Disp: 120 tablet, Rfl: 1   TRUEplus Lancets 28G MISC, Use to check blood sugar, Disp: 100 each, Rfl: 1  Objective: Patient appears/sounds stab;e.  They are in no apparent distress.  Breathing is non labored.  Mood and behavior are normal.  Laboratory Data:  Recent Results (from the past 2160 hour(s))  POC CBG monitoring     Status: Abnormal   Collection Time: 12/13/20  5:01 PM  Result Value Ref Range   Glucose-Capillary 399 (H) 70 - 99 mg/dL    Comment: Glucose reference range applies only to samples taken after fasting for at least 8 hours.     Assessment: 40 y.o. female with mild/moderate COVID 19 viral infection diagnosed on 01/14/21 at high risk for progression to severe COVID 19.  Plan:  This patient is a 40 y.o. female that meets the following criteria for Emergency Use Authorization of: Paxlovid 1. Age >12 yr AND > 40 kg 2. SARS-COV-2 positive test 3. Symptom onset < 5 days 4. Mild-to-moderate COVID disease with high risk for severe progression to hospitalization or death  I have spoken and communicated the following to the  patient or parent/caregiver regarding: Paxlovid is an unapproved drug that is authorized for use under an Emergency Use Authorization.  There are no adequate, approved, available products for the treatment of COVID-19 in adults who have mild-to-moderate COVID-19 and are at high risk for progressing to severe COVID-19, including hospitalization or death. Other therapeutics are currently authorized. For additional information on all products authorized for treatment or prevention of COVID-19, please see  [employer].  There are benefits and risks of taking this treatment as outlined in the Fact Sheet for Patients and Caregivers.  Fact Sheet for Patients and Caregivers was reviewed with patient. A hard copy will be provided to patient from pharmacy prior to the patient receiving treatment. Patients should continue to self-isolate and use infection control measures (e.g., wear mask, isolate, social distance, avoid sharing personal items, clean and disinfect high touch surfaces, and frequent handwashing) according to CDC guidelines.  The patient or parent/caregiver has the option to accept or refuse treatment. Patient medication history was reviewed for potential drug interactions:No drug interactions Patients GFR was calculated to be 112, and they were therefore prescribed Normal dose (GFR>60) - nirmatrelvir 176m tab (2 tablet) by mouth twice daily AND ritonavir 1069mtab (1 tablet) by mouth twice daily  After reviewing above information with the patient, the patient agrees to receive Paxlovid.  Follow up instructions:    Take prescription BID x 5 days as directed Reach out to pharmacist for counseling on medication if desired For concerns regarding further COVID symptoms please follow up with your PCP or urgent care For urgent or life-threatening issues, seek care at your local emergency department  The patient was provided an opportunity to ask questions, and all were answered. The patient agreed with the plan and demonstrated an understanding of the instructions.   [facility] and opted to pick up RX. Drive through  The patient was advised to call their PCP or seek an in-person evaluation if the symptoms worsen or if the condition fails to improve as anticipated.   I provided 10 minutes of non face-to-face telephone visit time during this encounter, and  > 50% was spent counseling as documented under my assessment & plan.  PaAsencion NobleMD 01/15/2021 /3:45 PM

## 2021-01-18 NOTE — Telephone Encounter (Signed)
This pt needs to be seen face to face for her fungal infection  I do not have any spots next week, can tonya nichols see her Krista White?? Or she can go to [facility]

## 2021-01-24 NOTE — Telephone Encounter (Signed)
Krista White  noone has called my pt, can you call the pt to set up an appt with any provider as a workin for tomorrow or next week?

## 2021-01-30 ENCOUNTER — Other Ambulatory Visit: Payer: Self-pay | Admitting: Critical Care Medicine

## 2021-01-30 DIAGNOSIS — Z1231 Encounter for screening mammogram for malignant neoplasm of breast: Secondary | ICD-10-CM

## 2021-02-06 ENCOUNTER — Other Ambulatory Visit: Payer: Self-pay

## 2021-02-06 ENCOUNTER — Ambulatory Visit: Payer: BC Managed Care – PPO | Attending: Critical Care Medicine | Admitting: Critical Care Medicine

## 2021-02-06 ENCOUNTER — Encounter: Payer: Self-pay | Admitting: Critical Care Medicine

## 2021-02-06 ENCOUNTER — Other Ambulatory Visit: Payer: Self-pay | Admitting: Pharmacist

## 2021-02-06 ENCOUNTER — Other Ambulatory Visit (HOSPITAL_COMMUNITY)
Admission: RE | Admit: 2021-02-06 | Discharge: 2021-02-06 | Disposition: A | Discharge status: Home / Self Care | Payer: BC Managed Care – PPO | Source: Ambulatory Visit | Attending: Critical Care Medicine | Admitting: Critical Care Medicine

## 2021-02-06 VITALS — BP 135/86 | HR 87 | Resp 16 | Wt 382.4 lb

## 2021-02-06 DIAGNOSIS — U071 COVID-19: Secondary | ICD-10-CM | POA: Diagnosis not present

## 2021-02-06 DIAGNOSIS — N951 Menopausal and female climacteric states: Secondary | ICD-10-CM | POA: Diagnosis not present

## 2021-02-06 DIAGNOSIS — E1165 Type 2 diabetes mellitus with hyperglycemia: Secondary | ICD-10-CM | POA: Diagnosis not present

## 2021-02-06 DIAGNOSIS — Z794 Long term (current) use of insulin: Secondary | ICD-10-CM

## 2021-02-06 DIAGNOSIS — B3731 Acute candidiasis of vulva and vagina: Secondary | ICD-10-CM | POA: Insufficient documentation

## 2021-02-06 DIAGNOSIS — Z6841 Body Mass Index (BMI) 40.0 and over, adult: Secondary | ICD-10-CM

## 2021-02-06 DIAGNOSIS — Z5900 Homelessness unspecified: Secondary | ICD-10-CM

## 2021-02-06 HISTORY — DX: Acute candidiasis of vulva and vagina: B37.31

## 2021-02-06 LAB — POCT GLYCOSYLATED HEMOGLOBIN (HGB A1C): HbA1c POC (<> result, manual entry): >15 % (ref 4.0–5.6)

## 2021-02-06 LAB — GLUCOSE, POCT (MANUAL RESULT ENTRY): POC Glucose: 407 mg/dl — AB (ref 70–99)

## 2021-02-06 MED ORDER — METFORMIN HCL 500 MG PO TABS
1000.0000 mg | ORAL_TABLET | Freq: Two times a day (BID) | ORAL | 1 refills | Status: DC
  Filled 2021-02-06 – 2021-05-29 (×2): qty 120, 30d supply, fill #0

## 2021-02-06 MED ORDER — TRULICITY 1.5 MG/0.5ML ~~LOC~~ SOAJ
1.5000 mg | SUBCUTANEOUS | 1 refills | Status: DC
  Filled 2021-02-06 – 2021-05-29 (×2): qty 2, 28d supply, fill #0

## 2021-02-06 MED ORDER — INSULIN GLARGINE-YFGN 100 UNIT/ML ~~LOC~~ SOPN
10.0000 [IU] | PEN_INJECTOR | Freq: Every day | SUBCUTANEOUS | 2 refills | Status: DC
  Filled 2021-02-06: qty 9, 84d supply, fill #0

## 2021-02-06 MED ORDER — FLUCONAZOLE 100 MG PO TABS
ORAL_TABLET | ORAL | 0 refills | Status: DC
  Filled 2021-02-06: qty 14, 13d supply, fill #0

## 2021-02-06 NOTE — Assessment & Plan Note (Signed)
Patient no longer has homelessness she now has a home

## 2021-02-06 NOTE — Progress Notes (Signed)
Established Patient Office Visit  Subjective:  Patient ID: Krista White, female    DOB: September 12, 1980  Age: 41 y.o. MRN: [id number]  CC:  Chief Complaint  Patient presents with   Diabetes   Vaginal Discharge    HPI Krista White presents for work in visit for vaginal discharge and itching.  Patient has type 2 diabetes A1c we last saw her glucose was greater than 15.  On arrival A1c is still greater than 15 and blood sugar she states the vomiting been anywhere from 3-400.  She did not pursue the Trulicity as we prescribed because of COVID insurance cost.  She was taking the metformin 1000 mg twice daily.  She states her diet is such that she does not eat breakfast she eats a lot of processed foods for lunch a sandwich at dinnertime.  The patient is recovering from [place] for which she received pack Slo-Bid in mid December  Patient does complain of some hot flashes. 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 Takes no meds and does not check her blood sugar.    Endometrial cancer [facility])    Endometrial cancer ([place]) 02/21/2015   Homelessness 05/24/2020   IBS (irritable bowel syndrome)    Nocturia    Obesity    Tingling    right hand    Past Surgical History:  Procedure Laterality Date   COLONOSCOPY WITH PROPOFOL N/A 02/14/2016   Procedure: COLONOSCOPY WITH PROPOFOL;  Surgeon: Milus Banister, MD;  Location: [facility];  Service: Endoscopy;  Laterality: N/A;   HYSTEROSCOPY WITH D & C N/A 01/30/2015   Procedure: DILATATION AND CURETTAGE /HYSTEROSCOPY;  Surgeon: Azucena Fallen, MD;  Location: [facility];  Service: Gynecology;  Laterality: N/A;   ROBOTIC ASSISTED TOTAL HYSTERECTOMY WITH BILATERAL SALPINGO  OOPHERECTOMY N/A 03/22/2015   Procedure: XI ROBOTIC ASSISTED TOTAL ABDOMINAL HYSTERECTOMY FOR UTERUS GREATER THAN [address] WITH BILATERAL SALPINGO OOPHORECTOMY;  Surgeon: Everitt Amber, MD;  Location: [facility];  Service: Gynecology;  Laterality: N/A;   TYMPANOSTOMY TUBE PLACEMENT     bilat     Family History  Problem Relation Age of Onset   Ovarian cancer Mother    Diabetes Father    Heart attack Father    Throat cancer Paternal Uncle    Diabetes Paternal Aunt    Diabetes Maternal Uncle     Social History   Socioeconomic History   Marital status: Single    Spouse name: Not on file   Number of children: 0   Years of education: Not on file   Highest education level: Not on file  Occupational History    Employer: [employer]  Tobacco Use   Smoking status: Never   Smokeless tobacco: Never  Vaping Use   Vaping Use: Never used  Substance and Sexual Activity   Alcohol use: No    Alcohol/week: 0.0 standard drinks   Drug use: No   Sexual activity: Not on file  Other Topics Concern   Not on file  Social History Narrative   Not on file   Social Determinants of Health   Financial Resource Strain: Not on file  Food Insecurity: Not on file  Transportation  Needs: Not on file  Physical Activity: Not on file  Stress: Not on file  Social Connections: Not on file  Intimate Partner Violence: Not on file    Outpatient Medications Prior to Visit  Medication Sig Dispense Refill   Blood Glucose Monitoring Suppl (TRUE METRIX METER) w/Device KIT Use to check blood sugar 1 kit 0   glucose blood (TRUE METRIX BLOOD GLUCOSE TEST) test strip Use as instructed 100 each 12   TRUEplus Lancets 28G MISC Use to check blood sugar 100 each 1   Dulaglutide (TRULICITY) 1.5 TY/6.0AY SOPN Inject 1.5 mg into the skin once a week. 2 mL 1   metFORMIN (GLUCOPHAGE) 500 MG tablet Take 2 tablets (1,000 mg total) by mouth 2 (two) times daily with a meal. 120 tablet 1   No facility-administered medications prior to  visit.    Allergies  Allergen Reactions   Asa [Aspirin] Hives   Latex Itching   Pineapple     Mouth itches   Tilactase Diarrhea    ROS Review of Systems  Constitutional:  Negative for chills, diaphoresis and fever.  HENT:  Negative for congestion, hearing loss, nosebleeds, sore throat and tinnitus.   Eyes:  Positive for visual disturbance. Negative for photophobia and redness.  Respiratory:  Negative for cough, shortness of breath, wheezing and stridor.   Cardiovascular:  Negative for chest pain, palpitations and leg swelling.  Gastrointestinal:  Negative for abdominal pain, blood in stool, constipation, diarrhea, nausea and vomiting.  Endocrine: Negative for polydipsia, polyphagia and polyuria.  Genitourinary:  Positive for vaginal discharge. Negative for decreased urine volume, dysuria, flank pain, frequency, hematuria, pelvic pain, urgency, vaginal bleeding and vaginal pain.  Musculoskeletal:  Negative for back pain, myalgias and neck pain.  Skin:  Negative for rash.  Allergic/Immunologic: Negative for environmental allergies.  Neurological:  Negative for dizziness, tremors, seizures, weakness, numbness and headaches.  Hematological:  Does not bruise/bleed easily.  Psychiatric/Behavioral:  Negative for dysphoric mood, self-injury, sleep disturbance and suicidal ideas. The patient is not nervous/anxious.      Objective:    Physical Exam Vitals reviewed.  Constitutional:      Appearance: Normal appearance. She is well-developed. She is obese. She is not diaphoretic.  HENT:     Head: Normocephalic and atraumatic.     Nose: No nasal deformity, septal deviation, mucosal edema or rhinorrhea.     Right Sinus: No maxillary sinus tenderness or frontal sinus tenderness.     Left Sinus: No maxillary sinus tenderness or frontal sinus tenderness.     Mouth/Throat:     Mouth: Mucous membranes are moist.     Pharynx: Oropharynx is clear. No oropharyngeal exudate.  Eyes:     General:  No scleral icterus.    Conjunctiva/sclera: Conjunctivae normal.     Pupils: Pupils are equal, round, and reactive to light.  Neck:     Thyroid: No thyromegaly.     Vascular: No carotid bruit or JVD.     Trachea: Trachea normal. No tracheal tenderness or tracheal deviation.  Cardiovascular:     Rate and Rhythm: Normal rate and regular rhythm.     Chest Wall: PMI is not displaced.     Pulses: Normal pulses. No decreased pulses.     Heart sounds: Normal heart sounds, S1 normal and S2 normal. Heart sounds not distant. No murmur heard. No systolic murmur is present.  No diastolic murmur is present.    No friction rub. No gallop. No S3 or S4 sounds.  Pulmonary:  Effort: Pulmonary effort is normal. No tachypnea, accessory muscle usage or respiratory distress.     Breath sounds: Normal breath sounds. No stridor. No decreased breath sounds, wheezing, rhonchi or rales.  Chest:     Chest wall: No tenderness.  Abdominal:     General: Bowel sounds are normal. There is no distension.     Palpations: Abdomen is soft. Abdomen is not rigid.     Tenderness: There is no abdominal tenderness. There is no guarding or rebound.  Genitourinary:    Vagina: Vaginal discharge present.     Comments: Perineal area is severely involved with candidiasis also there is vaginal swelling as well whitish discharge Musculoskeletal:        General: Normal range of motion.     Cervical back: Normal range of motion and neck supple. No edema, erythema or rigidity. No muscular tenderness. Normal range of motion.  Lymphadenopathy:     Head:     Right side of head: No submental or submandibular adenopathy.     Left side of head: No submental or submandibular adenopathy.     Cervical: No cervical adenopathy.  Skin:    General: Skin is warm and dry.     Coloration: Skin is not pale.     Findings: No rash.     Nails: There is no clubbing.  Neurological:     General: No focal deficit present.     Mental Status: She is  alert and oriented to person, place, and time. Mental status is at baseline.     Sensory: No sensory deficit.  Psychiatric:        Mood and Affect: Mood normal.        Speech: Speech normal.        Behavior: Behavior normal.        Thought Content: Thought content normal.        Judgment: Judgment normal.    BP 135/86    Pulse 87    Resp 16    Wt (!) 382 lb 6.4 oz (173.5 kg)    LMP 03/22/2015 Comment: has been spotting for a while   SpO2 96%    BMI 65.64 kg/m  Wt Readings from Last 3 Encounters:  02/06/21 (!) 382 lb 6.4 oz (173.5 kg)  05/24/20 (!) 372 lb (168.7 kg)  10/28/19 (!) 368 lb (166.9 kg)     Health Maintenance Due  Topic Date Due   OPHTHALMOLOGY EXAM  Never done   Pneumococcal Vaccine 47-39 Years old (2 - PCV) 10/12/2012   COVID-19 Vaccine (3 - Booster for Pfizer series) 04/04/2020    There are no preventive care reminders to display for this patient.  Lab Results  Component Value Date   TSH 2.03 11/24/2017   Lab Results  Component Value Date   WBC 6.1 11/24/2017   HGB 12.8 11/24/2017   HCT 38.4 11/24/2017   MCV 80.9 11/24/2017   PLT 360.0 11/24/2017   Lab Results  Component Value Date   NA 142 06/14/2020   K 4.5 06/14/2020   CO2 23 06/14/2020   GLUCOSE 104 (H) 06/14/2020   BUN 13 06/14/2020   CREATININE 0.68 06/14/2020   BILITOT <0.2 06/14/2020   ALKPHOS 136 (H) 06/14/2020   AST 18 06/14/2020   ALT 20 06/14/2020   PROT 7.4 06/14/2020   ALBUMIN 4.1 06/14/2020   CALCIUM 9.5 06/14/2020   ANIONGAP 8 03/23/2015   EGFR 112 06/14/2020   GFR 114.30 11/24/2017   Lab Results  Component  Value Date   CHOL 181 06/14/2020   Lab Results  Component Value Date   HDL 39 (L) 06/14/2020   Lab Results  Component Value Date   LDLCALC 117 (H) 06/14/2020   Lab Results  Component Value Date   TRIG 141 06/14/2020   Lab Results  Component Value Date   CHOLHDL 4.6 (H) 06/14/2020   Lab Results  Component Value Date   HGBA1C >15 02/06/2021       Assessment & Plan:   Problem List Items Addressed This Visit       Endocrine   Uncontrolled type 2 diabetes mellitus with hyperglycemia, with long-term current use of insulin ([place]) - Primary    Type 2 diabetes uncontrolled A1c greater than 15 we will need to start long-acting insulin at this time.  I spent about 10 minutes going over proper diet with this patient.  I brought in our clinical pharmacist to describe how to operate the insulin pen and he will also connect with her short-term follow-up for further diabetic interventions  Patient to resume Trulicity at 1.5 mg weekly and she will continue metformin 1000 mg twice daily and begin insulin glargine 10 units daily  Patient to be referred to ophthalmology for diabetic eye exam she does have insurance  Patient has sufficient diabetic supplies for testing.  Patient was seen in short-term follow-up by the clinical pharmacist in 3 weeks and then Dr. Joya Gaskins in 6 weeks      Relevant Medications   Dulaglutide (TRULICITY) 1.5 JQ/3.0SP SOPN   metFORMIN (GLUCOPHAGE) 500 MG tablet     Genitourinary   Vaginal yeast infection    Patient with vaginal yeast infection and also perineal infection plan to give a 10-day course of Diflucan orally and also Monistat injections into the vaginal area  Plan to refer to gynecology for further evaluations and treatments      Relevant Medications   fluconazole (DIFLUCAN) 100 MG tablet   Other Relevant Orders   Ambulatory referral to Gynecology   Cervicovaginal ancillary only     Other   Morbid obesity with BMI of 70 and over, adult [facility])    Referral was also made to medical nutritionist      Relevant Medications   Dulaglutide (TRULICITY) 1.5 QZ/3.0QT SOPN   metFORMIN (GLUCOPHAGE) 500 MG tablet   Perimenopausal   Relevant Orders   Ambulatory referral to Gynecology   RESOLVED: Homelessness    Patient no longer has homelessness she now has a home      RESOLVED: COVID-19 virus infection     COVID viral infection has resolved      Relevant Medications   fluconazole (DIFLUCAN) 100 MG tablet    Meds ordered this encounter  Medications   Dulaglutide (TRULICITY) 1.5 MA/2.6JF SOPN    Sig: Inject 1.5 mg into the skin once a week.    Dispense:  2 mL    Refill:  1   metFORMIN (GLUCOPHAGE) 500 MG tablet    Sig: Take 2 tablets (1,000 mg total) by mouth 2 (two) times daily with a meal.    Dispense:  120 tablet    Refill:  1    For future fills   fluconazole (DIFLUCAN) 100 MG tablet    Sig: Take two first day then daily    Dispense:  14 tablet    Refill:  0  38 minutes spent educating patient performing history and physical exam high degree of complexity and decision making confirmable with multiple consultants including clinical  pharmacy  Follow-up: Return in about 6 weeks (around 03/20/2021).    Asencion Noble, MD

## 2021-02-06 NOTE — Assessment & Plan Note (Signed)
Referral was also made to medical nutritionist

## 2021-02-06 NOTE — Assessment & Plan Note (Signed)
COVID viral infection has resolved

## 2021-02-06 NOTE — Assessment & Plan Note (Signed)
Patient with vaginal yeast infection and also perineal infection plan to give a 10-day course of Diflucan orally and also Monistat injections into the vaginal area  Plan to refer to gynecology for further evaluations and treatments

## 2021-02-06 NOTE — Patient Instructions (Signed)
Begin Semglee insulin 10 units daily per insulin pen  Stay on metformin 1000 mg twice daily  Resume Trulicity 1.5 mg weekly  All medications sent to our pharmacy  Modify your diet as we discussed  Take Diflucan 2 tablets once and then 1 daily till gone 14 tablets given  Go to any drugstore and purchase a Monistat vaginal suppository cream this is over-the-counter not by prescription and use this as well for 1 week  Referral to ophthalmology will be made  Referral to medical nutritionist will be made  Make an appointment to see Krista White, who is our clinical pharmacist in 3 weeks and see Dr. Joya White in 6 weeks at our new location  [place] up all your medications today as the pharmacy is closing after today will not open again till next week  Referral to gynecology also is going to be made

## 2021-02-06 NOTE — Assessment & Plan Note (Addendum)
Type 2 diabetes uncontrolled A1c greater than 15 we will need to start long-acting insulin at this time.  I spent about 10 minutes going over proper diet with this patient.  I brought in our clinical pharmacist to describe how to operate the insulin pen and he will also connect with her short-term follow-up for further diabetic interventions  Patient to resume Trulicity at 1.5 mg weekly and she will continue metformin 1000 mg twice daily and begin insulin glargine 10 units daily  Patient to be referred to ophthalmology for diabetic eye exam she does have insurance  Patient has sufficient diabetic supplies for testing.  Patient was seen in short-term follow-up by the clinical pharmacist in 3 weeks and then Dr. Joya Gaskins in 6 weeks

## 2021-02-07 LAB — CERVICOVAGINAL ANCILLARY ONLY
Bacterial Vaginitis (gardnerella): NEGATIVE
Candida Glabrata: NEGATIVE
Candida Vaginitis: POSITIVE — AB
Chlamydia: NEGATIVE
Comment: NEGATIVE
Comment: NEGATIVE
Comment: NEGATIVE
Comment: NEGATIVE
Comment: NEGATIVE
Comment: NORMAL
Neisseria Gonorrhea: NEGATIVE
Trichomonas: NEGATIVE

## 2021-02-15 ENCOUNTER — Ambulatory Visit (HOSPITAL_COMMUNITY)
Admission: EM | Admit: 2021-02-15 | Discharge: 2021-02-15 | Disposition: A | Discharge status: Home / Self Care | Payer: BC Managed Care – PPO | Attending: Student | Admitting: Student

## 2021-02-15 ENCOUNTER — Other Ambulatory Visit: Payer: Self-pay

## 2021-02-15 ENCOUNTER — Encounter (HOSPITAL_COMMUNITY): Payer: Self-pay

## 2021-02-15 DIAGNOSIS — E1165 Type 2 diabetes mellitus with hyperglycemia: Secondary | ICD-10-CM | POA: Diagnosis not present

## 2021-02-15 DIAGNOSIS — Z7984 Long term (current) use of oral hypoglycemic drugs: Secondary | ICD-10-CM | POA: Diagnosis not present

## 2021-02-15 DIAGNOSIS — J069 Acute upper respiratory infection, unspecified: Secondary | ICD-10-CM

## 2021-02-15 DIAGNOSIS — H66001 Acute suppurative otitis media without spontaneous rupture of ear drum, right ear: Secondary | ICD-10-CM

## 2021-02-15 MED ORDER — AMOXICILLIN 875 MG PO TABS: 875.0000 mg | ORAL_TABLET | Freq: Two times a day (BID) | ORAL | 0 refills | Status: AC

## 2021-02-15 NOTE — ED Provider Notes (Signed)
[place]    CSN: [id number] Arrival date & time: 02/15/21  1342      History   Chief Complaint No chief complaint on file.   HPI Krista White is a 41 y.o. female presenting with Pt presents with R ear pain and chest congestion x 3 days. Medical history obesity, diabetes. Covid-19 4 weeks ago, resolved following antiviral. Today with cough x3 days, productive of white and yellow sputum. R ear pain without dizziness, tinnitus, hearing changes. Denies SOB, CP, fevers/chills, n/v/d/c. Denies history pulm ds. Denies sick contacts. Diabetes is uncontrolled.    HPI  Past Medical History:  Diagnosis Date   Anemia    COVID-19 virus infection 01/15/2021   Diabetes mellitus without complication ([place])    pt states she was pre diabetic but is no longer. Takes no meds and does not check her blood sugar.    Endometrial cancer ([place])    Endometrial cancer ([place]) 02/21/2015   Homelessness 05/24/2020   IBS (irritable bowel syndrome)    Nocturia    Obesity    Tingling    right hand    Patient Active Problem List   Diagnosis Date Noted   Vaginal yeast infection 02/06/2021   Perimenopausal 02/06/2021   Uncontrolled type 2 diabetes mellitus with hyperglycemia, with long-term current use of insulin ([place]) 05/24/2020   Morbid obesity with BMI of 70 and over, adult ([place]) 02/21/2015    Past Surgical History:  Procedure Laterality Date   COLONOSCOPY WITH PROPOFOL N/A 02/14/2016   Procedure: COLONOSCOPY WITH PROPOFOL;  Surgeon: Milus Banister, MD;  [facility];  Service: Endoscopy;  Laterality: N/A;   HYSTEROSCOPY WITH D & C N/A 01/30/2015   Procedure: DILATATION AND CURETTAGE /HYSTEROSCOPY;  Surgeon: Azucena Fallen, MD;  Location: [facility];  Service: Gynecology;  Laterality: N/A;   ROBOTIC ASSISTED TOTAL HYSTERECTOMY WITH BILATERAL SALPINGO OOPHERECTOMY N/A 03/22/2015   Procedure: XI ROBOTIC ASSISTED TOTAL ABDOMINAL HYSTERECTOMY FOR UTERUS GREATER THAN [address] WITH  BILATERAL SALPINGO OOPHORECTOMY;  Surgeon: Everitt Amber, MD;  Location: [facility];  Service: Gynecology;  Laterality: N/A;   TYMPANOSTOMY TUBE PLACEMENT     bilat     OB History   No obstetric history on file.      Home Medications    Prior to Admission medications   Medication Sig Start Date End Date Taking? Authorizing Provider  amoxicillin (AMOXIL) 875 MG tablet Take 1 tablet (875 mg total) by mouth 2 (two) times daily for 7 days. 02/15/21 02/22/21 Yes Hazel Sams, PA-C  Blood Glucose Monitoring Suppl (TRUE METRIX METER) w/Device KIT Use to check blood sugar 05/24/20   Elsie Stain, MD  Dulaglutide (TRULICITY) 1.5 GU/4.4IH SOPN Inject 1.5 mg into the skin once a week. 02/06/21   Elsie Stain, MD  fluconazole (DIFLUCAN) 100 MG tablet Take two first day then daily 02/06/21   Elsie Stain, MD  glucose blood (TRUE METRIX BLOOD GLUCOSE TEST) test strip Use as instructed 05/24/20   Elsie Stain, MD  insulin glargine-yfgn (SEMGLEE, YFGN,) 100 UNIT/ML Pen Inject 10 Units into the skin daily. 02/06/21   Elsie Stain, MD  metFORMIN (GLUCOPHAGE) 500 MG tablet Take 2 tablets (1,000 mg total) by mouth 2 (two) times daily with a meal. 02/06/21   Elsie Stain, MD  TRUEplus Lancets 28G MISC Use to check blood sugar 05/24/20   Elsie Stain, MD    Family History Family History  Problem Relation Age of Onset  Ovarian cancer Mother    Diabetes Father    Heart attack Father    Throat cancer Paternal Uncle    Diabetes Paternal Aunt    Diabetes Maternal Uncle     Social History Social History   Tobacco Use   Smoking status: Never   Smokeless tobacco: Never  Vaping Use   Vaping Use: Never used  Substance Use Topics   Alcohol use: No    Alcohol/week: 0.0 standard drinks   Drug use: No     Allergies   Asa [aspirin], Latex, Pineapple, and Tilactase   Review of Systems Review of Systems  Constitutional:  Negative for appetite change, chills and fever.  HENT:   Positive for congestion and ear pain. Negative for rhinorrhea, sinus pressure, sinus pain and sore throat.   Eyes:  Negative for redness and visual disturbance.  Respiratory:  Positive for cough. Negative for chest tightness, shortness of breath and wheezing.   Cardiovascular:  Negative for chest pain and palpitations.  Gastrointestinal:  Negative for abdominal pain, constipation, diarrhea, nausea and vomiting.  Genitourinary:  Negative for dysuria, frequency and urgency.  Musculoskeletal:  Negative for myalgias.  Neurological:  Negative for dizziness, weakness and headaches.  Psychiatric/Behavioral:  Negative for confusion.   All other systems reviewed and are negative.   Physical Exam Triage Vital Signs ED Triage Vitals  Enc Vitals Group     BP 02/15/21 1435 135/90     Pulse Rate 02/15/21 1435 78     Resp 02/15/21 1435 17     Temp 02/15/21 1435 98.7 F (37.1 C)     Temp Source 02/15/21 1435 Oral     SpO2 02/15/21 1435 97 %     Weight --      Height --      Head Circumference --      Peak Flow --      Pain Score 02/15/21 1433 6     Pain Loc --      Pain Edu? --      Excl. in [place]? --    No data found.  Updated Vital Signs BP 135/90 (BP Location: Left Arm)    Pulse 78    Temp 98.7 F (37.1 C) (Oral)    Resp 17    LMP 03/22/2015 Comment: has been spotting for a while   SpO2 97%   Visual Acuity Right Eye Distance:   Left Eye Distance:   Bilateral Distance:    Right Eye Near:   Left Eye Near:    Bilateral Near:     Physical Exam Vitals reviewed.  Constitutional:      General: She is not in acute distress.    Appearance: Normal appearance. She is obese. She is not ill-appearing.  HENT:     Head: Normocephalic and atraumatic.     Right Ear: Ear canal and external ear normal. No tenderness. No middle ear effusion. There is impacted cerumen. Tympanic membrane is erythematous. Tympanic membrane is not perforated, retracted or bulging.     Left Ear: Tympanic membrane, ear  canal and external ear normal. No tenderness.  No middle ear effusion. There is no impacted cerumen. Tympanic membrane is not perforated, erythematous, retracted or bulging.     Ears:     Comments: R TM partially occluded by cerumen. I can visualize 50% of TM, which appears erythematous.     Nose: Nose normal. No congestion.     Mouth/Throat:     Mouth: Mucous membranes are moist.  Pharynx: Uvula midline. No oropharyngeal exudate or posterior oropharyngeal erythema.  Eyes:     Extraocular Movements: Extraocular movements intact.     Pupils: Pupils are equal, round, and reactive to light.  Cardiovascular:     Rate and Rhythm: Normal rate and regular rhythm.     Heart sounds: Normal heart sounds.  Pulmonary:     Effort: Pulmonary effort is normal.     Breath sounds: Normal breath sounds. No decreased breath sounds, wheezing, rhonchi or rales.  Abdominal:     Palpations: Abdomen is soft.     Tenderness: There is no abdominal tenderness. There is no guarding or rebound.  Lymphadenopathy:     Cervical: Cervical adenopathy present.     Right cervical: Superficial cervical adenopathy present.     Left cervical: No superficial cervical adenopathy.  Neurological:     General: No focal deficit present.     Mental Status: She is alert and oriented to person, place, and time.  Psychiatric:        Mood and Affect: Mood normal.        Behavior: Behavior normal.        Thought Content: Thought content normal.        Judgment: Judgment normal.     UC Treatments / Results  Labs (all labs ordered are listed, but only abnormal results are displayed) Labs Reviewed - No data to display  EKG   Radiology No results found.  Procedures Procedures (including critical care time)  Medications Ordered in UC Medications - No data to display  Initial Impression / Assessment and Plan / UC Course  I have reviewed the triage vital signs and the nursing notes.  Pertinent labs & imaging results  that were available during my care of the patient were reviewed by me and considered in my medical decision making (see chart for details).     This patient is a very pleasant 41 y.o. year old female presenting with R AOM and viral URI.Mar[place] Kitchen  Afebrile, nontachy.  Hysterectomy contraception.  COVID and influenza PCR deferred given mild symptoms.  Amoxicillin as below.  Uncontrolled diabetes - A1c >15 at PCP visit 02/06/21. Continue to follow with them for this. Low concern for DKA today - no n/v, dizziness, CP, etc.   ED return precautions discussed. Patient verbalizes understanding and agreement.     Final Clinical Impressions(s) / UC Diagnoses   Final diagnoses:  Type 2 diabetes mellitus with hyperglycemia, with long-term current use of insulin (HCC)  Viral URI with cough  Non-recurrent acute suppurative otitis media of right ear without spontaneous rupture of tympanic membrane     Discharge Instructions      -Start the antibiotic-Amoxicillin 1 pill every 12 hours for 7 days.  You can take this with food like with breakfast and dinner. -Follow-up with PCP as scheduled in 2-3 weeks.      ED Prescriptions     Medication Sig Dispense Auth. Provider   amoxicillin (AMOXIL) 875 MG tablet Take 1 tablet (875 mg total) by mouth 2 (two) times daily for 7 days. 14 tablet Hazel Sams, PA-C      PDMP not reviewed this encounter.   Hazel Sams, PA-C 02/15/21 1506

## 2021-02-15 NOTE — Discharge Instructions (Addendum)
-  Start the antibiotic-Amoxicillin 1 pill every 12 hours for 7 days.  You can take this with food like with breakfast and dinner. -Follow-up with PCP as scheduled in 2-3 weeks.

## 2021-02-15 NOTE — ED Triage Notes (Signed)
Pt presents with R ear pain and chest congestion x 3 days.

## 2021-02-26 ENCOUNTER — Institutional Professional Consult (permissible substitution) (HOSPITAL_BASED_OUTPATIENT_CLINIC_OR_DEPARTMENT_OTHER): Payer: BC Managed Care – PPO | Admitting: Obstetrics & Gynecology

## 2021-03-07 NOTE — Progress Notes (Unsigned)
° °  PCP: Dr. Joya Gaskins  S:    Patient presents for diabetes evaluation, education, and management. Patient was referred and last seen by Primary Care Provider on 02/06/21. Never started Trulicity prescribed by CPP in 2022 due to cost. With A1c >15, Semglee and Trulicity were started.   Today, *** -adherence to meds?  -titrate insulin vs trulicity. Add mealtime?   Family/Social History:  FHx: DM in father, MI in father Tobacco: never smoker  Alcohol: none  Insurance coverage/medication affordability: self pay  Medication adherence reported.   Current diabetes medications include: Semglee 15 units daily, Trulicity 1.5 mg weekly, metformin 1000 mg BID.   Patient denies hypoglycemia.    Patient reported dietary habits: ***  Patient-reported exercise habits: ***   Patient denies nocturia (nighttime urination).  Patient *** endorses neuropathy (nerve pain). Patient denies visual changes. Patient reports self foot exams.     O:   Lab Results  Component Value Date   HGBA1C >15 02/06/2021   There were no vitals filed for this visit.  Lipid Panel     Component Value Date/Time   CHOL 181 06/14/2020 1449   TRIG 141 06/14/2020 1449   HDL 39 (L) 06/14/2020 1449   CHOLHDL 4.6 (H) 06/14/2020 1449   LDLCALC 117 (H) 06/14/2020 1449   Home fasting blood sugars: *** 2 hour post-meal/random blood sugars: ***  Clinical Atherosclerotic Cardiovascular Disease (ASCVD): No  The 10-year ASCVD risk score (Arnett DK, et al., 2019) is: 4.4%   Values used to calculate the score:     Age: 41 years     Sex: Female     Is Non-Hispanic African American: Yes     Diabetic: Yes     Tobacco smoker: No     Systolic Blood Pressure: 812 mmHg     Is BP treated: No     HDL Cholesterol: 39 mg/dL     Total Cholesterol: 181 mg/dL    A/P: Diabetes longstanding currently uncontrolled with most recent A1c >15 on 02/06/21. Has not had an A1c <15 since 08/2018 and none <10 since 2017. Patient is able to  verbalize appropriate hypoglycemia management plan. Medication adherence appears ***.  -*** -Extensively discussed pathophysiology of diabetes, recommended lifestyle interventions, dietary effects on blood sugar control -Counseled on s/sx of and management of hypoglycemia -Next A1C anticipated 05/2021.   Written patient instructions provided.  Total time in face to face counseling 30 minutes.   Follow up *** Clinic Visit ***.

## 2021-03-11 ENCOUNTER — Ambulatory Visit: Payer: BC Managed Care – PPO | Admitting: Pharmacist

## 2021-03-27 ENCOUNTER — Ambulatory Visit: Payer: BC Managed Care – PPO | Admitting: Registered"

## 2021-05-29 ENCOUNTER — Other Ambulatory Visit (HOSPITAL_COMMUNITY): Payer: Self-pay

## 2021-05-30 ENCOUNTER — Other Ambulatory Visit (HOSPITAL_COMMUNITY): Payer: Self-pay

## 2021-06-03 ENCOUNTER — Other Ambulatory Visit: Payer: Self-pay | Admitting: Critical Care Medicine

## 2021-06-03 NOTE — Telephone Encounter (Signed)
Copied from [place] [id number]. Topic: General - Other ?>> Jun 03, 2021 11:25 AM Tessa Lerner A wrote: ?Reason for CRM: Medication Refill - Medication: Rx #: [id number]  ?Dulaglutide (TRULICITY) 1.5 QI/6.9GE SOPN [[id number]]  ? ?Rx #: [id number]  ?metFORMIN (GLUCOPHAGE) 500 MG tablet [[id number]]  ? ? ?Has the patient contacted their pharmacy? No. The patient has attempted unsuccessfully to contact the pharmacy  ?(Agent: If no, request that the patient contact the pharmacy for the refill. If patient does not wish to contact the pharmacy document the reason why and proceed with request.) ?(Agent: If yes, when and what did the pharmacy advise?) ? ?Preferred Pharmacy (with phone number or street name): [facility], [place] [address] ?Phone: [phone] Fax: [phone] ?Hours: M-F 7:30a-6:00p ? ? ?Has the patient been seen for an appointment in the last year OR does the patient have an upcoming appointment? Yes.   ? ?Agent: Please be advised that RX refills may take up to 3 business days. We ask that you follow-up with your pharmacy. ?

## 2021-06-04 ENCOUNTER — Other Ambulatory Visit: Payer: Self-pay

## 2021-06-04 MED ORDER — TRULICITY 1.5 MG/0.5ML ~~LOC~~ SOAJ
1.5000 mg | SUBCUTANEOUS | 1 refills | Status: DC
  Filled 2021-06-04 – 2022-03-06 (×6): qty 2, 28d supply, fill #0

## 2021-06-04 NOTE — Telephone Encounter (Signed)
Requested Prescriptions  ?Pending Prescriptions Disp Refills  ?? Dulaglutide (TRULICITY) 1.5 FV/4.9SW SOPN 2 mL 1  ?  Sig: Inject 1.5 mg into the skin once a week.  ?  ? Endocrinology:  Diabetes - GLP-1 Receptor Agonists Failed - 06/03/2021  5:24 PM  ?  ?  Failed - HBA1C is between 0 and 7.9 and within 180 days  ?  HbA1c POC (<> result, manual entry)  ?Date Value Ref Range Status  ?02/06/2021 >15 4.0 - 5.6 % Final  ?   ?  ?  Passed - Valid encounter within last 6 months  ?  Recent Outpatient Visits   ?      ? 3 months ago Type 2 diabetes mellitus with hyperglycemia, without long-term current use of insulin ([place])  ? [place] Elsie Stain, MD  ? 11 months ago Type 2 diabetes mellitus with hyperglycemia, without long-term current use of insulin ([place])  ? [place], RPH-CPP  ? 11 months ago Type 2 diabetes mellitus with hyperglycemia, without long-term current use of insulin ([place])  ? [place], RPH-CPP  ? 1 year ago Type 2 diabetes mellitus with hyperglycemia, without long-term current use of insulin ([place])  ? [place], RPH-CPP  ? 1 year ago Type 2 diabetes mellitus with hyperglycemia, without long-term current use of insulin ([place])  ? [place] Fulp, [place], MD  ?  ?  ? ?  ?  ?  ?? metFORMIN (GLUCOPHAGE) 500 MG tablet 120 tablet 1  ?  Sig: Take 2 tablets (1,000 mg total) by mouth 2 (two) times daily with a meal.  ?  ? Endocrinology:  Diabetes - Biguanides Failed - 06/03/2021  5:24 PM  ?  ?  Failed - HBA1C is between 0 and 7.9 and within 180 days  ?  HbA1c POC (<> result, manual entry)  ?Date Value Ref Range Status  ?02/06/2021 >15 4.0 - 5.6 % Final  ?   ?  ?  Failed - B12 Level in normal range and within 720 days  ?  No results found for: VITAMINB12   ?  ?  Failed - CBC within normal limits and  completed in the last 12 months  ?  WBC  ?Date Value Ref Range Status  ?11/24/2017 6.1 4.0 - 10.5 K/uL Final  ? ?RBC  ?Date Value Ref Range Status  ?11/24/2017 4.75 3.87 - 5.11 Mil/uL Final  ? ?Hemoglobin  ?Date Value Ref Range Status  ?11/24/2017 12.8 12.0 - 15.0 g/dL Final  ? ?HCT  ?Date Value Ref Range Status  ?11/24/2017 38.4 36.0 - 46.0 % Final  ? ?MCHC  ?Date Value Ref Range Status  ?11/24/2017 33.3 30.0 - 36.0 g/dL Final  ? ?MCH  ?Date Value Ref Range Status  ?03/23/2015 23.2 (L) 26.0 - 34.0 pg Final  ? ?MCV  ?Date Value Ref Range Status  ?11/24/2017 80.9 78.0 - 100.0 fl Final  ? ?No results found for: PLTCOUNTKUC, LABPLAT, [place] ?RDW  ?Date Value Ref Range Status  ?11/24/2017 13.4 11.5 - 15.5 % Final  ? ?  ?  ?  Passed - Cr in normal range and within 360 days  ?  Creatinine, Ser  ?Date Value Ref Range Status  ?06/14/2020 0.68 0.57 - 1.00 mg/dL Final  ?   ?  ?  Passed - eGFR in normal range and within 360 days  ?  GFR calc Af Amer  ?Date Value Ref Range Status  ?10/28/2019 104 >59 mL/min/1.73 Final  ?  Comment:  ?  **[facility] currently reports eGFR in compliance with the current** ?  recommendations of the [employer]. [facility] will ?  update reporting as new guidelines are published from the NKF-ASN ?  Task force. ?  ? ?GFR calc non Af Amer  ?Date Value Ref Range Status  ?10/28/2019 90 >59 mL/min/1.73 Final  ? ?GFR  ?Date Value Ref Range Status  ?11/24/2017 114.30 >60.00 mL/min Final  ? ?eGFR  ?Date Value Ref Range Status  ?06/14/2020 112 >59 mL/min/1.73 Final  ?   ?  ?  Passed - Valid encounter within last 6 months  ?  Recent Outpatient Visits   ?      ? 3 months ago Type 2 diabetes mellitus with hyperglycemia, without long-term current use of insulin ([place])  ? [place] Elsie Stain, MD  ? 11 months ago Type 2 diabetes mellitus with hyperglycemia, without long-term current use of insulin ([place])  ? [place], RPH-CPP  ? 11 months ago Type 2 diabetes mellitus with hyperglycemia, without long-term current use of insulin ([place])  ? [place], RPH-CPP  ? 1 year ago Type 2 diabetes mellitus with hyperglycemia, without long-term current use of insulin ([place])  ? [place], RPH-CPP  ? 1 year ago Type 2 diabetes mellitus with hyperglycemia, without long-term current use of insulin ([place])  ? [place] Fulp, Ander Gaster, MD  ?  ?  ? ?  ?  ?  ? ?

## 2021-06-04 NOTE — Telephone Encounter (Signed)
Requested medication (s) are due for refill today - yes ? ?Requested medication (s) are on the active medication list -yes ? ?Future visit scheduled -no ? ?Last refill: 02/06/21 #120 1RF ? ?Notes to clinic: Request RF: fails lab protocol, no follow up as recommended ? ?Requested Prescriptions  ?Pending Prescriptions Disp Refills  ? metFORMIN (GLUCOPHAGE) 500 MG tablet 120 tablet 1  ?  Sig: Take 2 tablets (1,000 mg total) by mouth 2 (two) times daily with a meal.  ?  ? Endocrinology:  Diabetes - Biguanides Failed - 06/03/2021  5:24 PM  ?  ?  Failed - HBA1C is between 0 and 7.9 and within 180 days  ?  HbA1c POC (<> result, manual entry)  ?Date Value Ref Range Status  ?02/06/2021 >15 4.0 - 5.6 % Final  ?  ?  ?  ?  Failed - B12 Level in normal range and within 720 days  ?  No results found for: VITAMINB12  ?  ?  ?  Failed - CBC within normal limits and completed in the last 12 months  ?  WBC  ?Date Value Ref Range Status  ?11/24/2017 6.1 4.0 - 10.5 K/uL Final  ? ?RBC  ?Date Value Ref Range Status  ?11/24/2017 4.75 3.87 - 5.11 Mil/uL Final  ? ?Hemoglobin  ?Date Value Ref Range Status  ?11/24/2017 12.8 12.0 - 15.0 g/dL Final  ? ?HCT  ?Date Value Ref Range Status  ?11/24/2017 38.4 36.0 - 46.0 % Final  ? ?MCHC  ?Date Value Ref Range Status  ?11/24/2017 33.3 30.0 - 36.0 g/dL Final  ? ?MCH  ?Date Value Ref Range Status  ?03/23/2015 23.2 (L) 26.0 - 34.0 pg Final  ? ?MCV  ?Date Value Ref Range Status  ?11/24/2017 80.9 78.0 - 100.0 fl Final  ? ?No results found for: PLTCOUNTKUC, LABPLAT, [place] ?RDW  ?Date Value Ref Range Status  ?11/24/2017 13.4 11.5 - 15.5 % Final  ? ?  ?  ?  Passed - Cr in normal range and within 360 days  ?  Creatinine, Ser  ?Date Value Ref Range Status  ?06/14/2020 0.68 0.57 - 1.00 mg/dL Final  ?  ?  ?  ?  Passed - eGFR in normal range and within 360 days  ?  GFR calc Af Amer  ?Date Value Ref Range Status  ?10/28/2019 104 >59 mL/min/1.73 Final  ?  Comment:  ?  **[facility] currently reports eGFR in compliance with  the current** ?  recommendations of the [employer]. [facility] will ?  update reporting as new guidelines are published from the NKF-ASN ?  Task force. ?  ? ?GFR calc non Af Amer  ?Date Value Ref Range Status  ?10/28/2019 90 >59 mL/min/1.73 Final  ? ?GFR  ?Date Value Ref Range Status  ?11/24/2017 114.30 >60.00 mL/min Final  ? ?eGFR  ?Date Value Ref Range Status  ?06/14/2020 112 >59 mL/min/1.73 Final  ?  ?  ?  ?  Passed - Valid encounter within last 6 months  ?  Recent Outpatient Visits   ? ?      ? 3 months ago Type 2 diabetes mellitus with hyperglycemia, without long-term current use of insulin ([place])  ? [place] Elsie Stain, MD  ? 11 months ago Type 2 diabetes mellitus with hyperglycemia, without long-term current use of insulin ([place])  ? [place], RPH-CPP  ? 11 months ago Type 2 diabetes mellitus  with hyperglycemia, without long-term current use of insulin ([place])  ? [place], RPH-CPP  ? 1 year ago Type 2 diabetes mellitus with hyperglycemia, without long-term current use of insulin ([place])  ? [place], RPH-CPP  ? 1 year ago Type 2 diabetes mellitus with hyperglycemia, without long-term current use of insulin ([place])  ? [place] Fulp, Ander Gaster, MD  ? ?  ?  ? ? ?  ?  ?  ?Signed Prescriptions Disp Refills  ? Dulaglutide (TRULICITY) 1.5 WY/6.3ZC SOPN 2 mL 1  ?  Sig: Inject 1.5 mg into the skin once a week.  ?  ? Endocrinology:  Diabetes - GLP-1 Receptor Agonists Failed - 06/03/2021  5:24 PM  ?  ?  Failed - HBA1C is between 0 and 7.9 and within 180 days  ?  HbA1c POC (<> result, manual entry)  ?Date Value Ref Range Status  ?02/06/2021 >15 4.0 - 5.6 % Final  ?  ?  ?  ?  Passed - Valid encounter within last 6 months  ?  Recent Outpatient Visits   ? ?      ? 3 months ago Type 2  diabetes mellitus with hyperglycemia, without long-term current use of insulin ([place])  ? [place] Elsie Stain, MD  ? 11 months ago Type 2 diabetes mellitus with hyperglycemia, without long-term current use of insulin ([place])  ? [place], RPH-CPP  ? 11 months ago Type 2 diabetes mellitus with hyperglycemia, without long-term current use of insulin ([place])  ? [place], RPH-CPP  ? 1 year ago Type 2 diabetes mellitus with hyperglycemia, without long-term current use of insulin ([place])  ? [place], RPH-CPP  ? 1 year ago Type 2 diabetes mellitus with hyperglycemia, without long-term current use of insulin ([place])  ? [place] Fulp, [place], MD  ? ?  ?  ? ? ?  ?  ?  ? ? ? ?Requested Prescriptions  ?Pending Prescriptions Disp Refills  ? metFORMIN (GLUCOPHAGE) 500 MG tablet 120 tablet 1  ?  Sig: Take 2 tablets (1,000 mg total) by mouth 2 (two) times daily with a meal.  ?  ? Endocrinology:  Diabetes - Biguanides Failed - 06/03/2021  5:24 PM  ?  ?  Failed - HBA1C is between 0 and 7.9 and within 180 days  ?  HbA1c POC (<> result, manual entry)  ?Date Value Ref Range Status  ?02/06/2021 >15 4.0 - 5.6 % Final  ?  ?  ?  ?  Failed - B12 Level in normal range and within 720 days  ?  No results found for: VITAMINB12  ?  ?  ?  Failed - CBC within normal limits and completed in the last 12 months  ?  WBC  ?Date Value Ref Range Status  ?11/24/2017 6.1 4.0 - 10.5 K/uL Final  ? ?RBC  ?Date Value Ref Range Status  ?11/24/2017 4.75 3.87 - 5.11 Mil/uL Final  ? ?Hemoglobin  ?Date Value Ref Range Status  ?11/24/2017 12.8 12.0 - 15.0 g/dL Final  ? ?HCT  ?Date Value Ref Range Status  ?11/24/2017 38.4 36.0 - 46.0 % Final  ? ?MCHC  ?Date Value Ref Range Status  ?11/24/2017  33.3 30.0 - 36.0 g/dL Final  ? ?MCH  ?Date Value Ref  Range Status  ?03/23/2015 23.2 (L) 26.0 - 34.0 pg Final  ? ?MCV  ?Date Value Ref Range Status  ?11/24/2017 80.9 78.0 - 100.0 fl Final  ? ?No results found for: PLTCOUNTKUC, LABPLAT, [place] ?RDW  ?Date Value Ref Range Status  ?11/24/2017 13.4 11.5 - 15.5 % Final  ? ?  ?  ?  Passed - Cr in normal range and within 360 days  ?  Creatinine, Ser  ?Date Value Ref Range Status  ?06/14/2020 0.68 0.57 - 1.00 mg/dL Final  ?  ?  ?  ?  Passed - eGFR in normal range and within 360 days  ?  GFR calc Af Amer  ?Date Value Ref Range Status  ?10/28/2019 104 >59 mL/min/1.73 Final  ?  Comment:  ?  **[facility] currently reports eGFR in compliance with the current** ?  recommendations of the [employer]. [facility] will ?  update reporting as new guidelines are published from the NKF-ASN ?  Task force. ?  ? ?GFR calc non Af Amer  ?Date Value Ref Range Status  ?10/28/2019 90 >59 mL/min/1.73 Final  ? ?GFR  ?Date Value Ref Range Status  ?11/24/2017 114.30 >60.00 mL/min Final  ? ?eGFR  ?Date Value Ref Range Status  ?06/14/2020 112 >59 mL/min/1.73 Final  ?  ?  ?  ?  Passed - Valid encounter within last 6 months  ?  Recent Outpatient Visits   ? ?      ? 3 months ago Type 2 diabetes mellitus with hyperglycemia, without long-term current use of insulin ([place])  ? [place] Elsie Stain, MD  ? 11 months ago Type 2 diabetes mellitus with hyperglycemia, without long-term current use of insulin ([place])  ? [place], RPH-CPP  ? 11 months ago Type 2 diabetes mellitus with hyperglycemia, without long-term current use of insulin ([place])  ? [place], RPH-CPP  ? 1 year ago Type 2 diabetes mellitus with hyperglycemia, without long-term current use of insulin ([place])  ? [place], RPH-CPP  ? 1 year ago Type 2 diabetes mellitus with hyperglycemia, without  long-term current use of insulin ([place])  ? [place] Fulp, Ander Gaster, MD  ? ?  ?  ? ? ?  ?  ?  ?Signed Prescriptions Disp Refills  ? Dulaglutide (TRULICITY) 1.5 HT/0.9PJ SOPN 2

## 2021-06-06 ENCOUNTER — Other Ambulatory Visit: Payer: Self-pay

## 2021-06-06 MED ORDER — METFORMIN HCL 500 MG PO TABS
1000.0000 mg | ORAL_TABLET | Freq: Two times a day (BID) | ORAL | 1 refills | Status: DC
  Filled 2021-06-06 – 2021-11-19 (×2): qty 120, 30d supply, fill #0

## 2021-06-28 ENCOUNTER — Other Ambulatory Visit (HOSPITAL_COMMUNITY): Payer: Self-pay

## 2021-06-29 ENCOUNTER — Other Ambulatory Visit (HOSPITAL_COMMUNITY): Payer: Self-pay

## 2021-07-09 ENCOUNTER — Encounter: Payer: Self-pay | Admitting: Critical Care Medicine

## 2021-07-10 MED ORDER — INSULIN PEN NEEDLE 31G X 5 MM MISC: 2 refills | Status: DC

## 2021-07-10 NOTE — Telephone Encounter (Signed)
Carly this pt needs to see me soon for DM f/u in next two to three weeks

## 2021-07-18 ENCOUNTER — Ambulatory Visit: Payer: BC Managed Care – PPO | Admitting: Critical Care Medicine

## 2021-07-18 NOTE — Progress Notes (Deleted)
Established Patient Office Visit  Subjective:  Patient ID: Krista White, female    DOB: 1980/04/09  Age: 41 y.o. MRN: [id number]  CC:  No chief complaint on file.   HPI 02/2021 Krista White presents for work in visit for vaginal discharge and itching.  Patient has type 2 diabetes A1c we last saw her glucose was greater than 15.  On arrival A1c is still greater than 15 and blood sugar she states the vomiting been anywhere from 3-400.  She did not pursue the Trulicity as we prescribed because of COVID insurance cost.  She was taking the metformin 1000 mg twice daily.  She states her diet is such that she does not eat breakfast she eats a lot of processed foods for lunch a sandwich at dinnertime.  The patient is recovering from [place] for which she received pack Slo-Bid in mid December  Patient does complain of some hot flashes. 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  I spent about 10 minutes going over proper diet with this patient.  I brought in our clinical pharmacist to describe how to operate the insulin pen and he will also connect with her short-term follow-up for further diabetic interventions  Patient to resume Trulicity at 1.5 mg weekly and she will continue metformin 1000 mg twice daily and begin insulin glargine 10 units daily  Patient to be referred to ophthalmology for diabetic eye exam she does have insurance  Patient has sufficient diabetic supplies for testing.  Patient was seen in short-term follow-up by the clinical pharmacist in 3 weeks and then Dr. Joya Gaskins in 6 weeks      Relevant Medications    Dulaglutide (TRULICITY) 1.5 XJ/8.8TG SOPN   metFORMIN (GLUCOPHAGE) 500 MG tablet     Genitourinary   Vaginal yeast infection    Patient with vaginal yeast infection and also perineal infection plan to give a 10-day course of Diflucan orally and also Monistat injections into the vaginal area  Plan to refer to gynecology for further evaluations and treatments      Relevant Medications   fluconazole (DIFLUCAN) 100 MG tablet   Other Relevant Orders   Ambulatory referral to Gynecology   Cervicovaginal ancillary only     Other   Morbid obesity with BMI of 70 and over, adult [facility])    Referral was also made to medical nutritionist      Relevant Medications   Dulaglutide (TRULICITY) 1.5 PQ/9.8YM SOPN   metFORMIN (GLUCOPHAGE) 500 MG tablet   Perimenopausal   Relevant Orders   Ambulatory referral to Gynecology   RESOLVED: Homelessness    Patient no longer has homelessness she now has a home      RESOLVED: COVID-19 virus infection    COVID viral infection has resolved      Relevant Medications   fluconazole (DIFLUCAN) 100 MG tablet    Meds ordered this encounter  Medications   Dulaglutide (TRULICITY) 1.5 EB/5.8XE SOPN    Sig: Inject 1.5 mg into the skin once a week.    Dispense:  2 mL    Refill:  1  metFORMIN (GLUCOPHAGE) 500 MG tablet    Sig: Take 2 tablets (1,000 mg total) by mouth 2 (two) times daily with a meal.    Dispense:  120 tablet    Refill:  1    For future fills   fluconazole (DIFLUCAN) 100 MG tablet    Sig: Take two first day then daily    Dispense:  14 tablet    Refill:  0   Past Medical History:  Diagnosis Date   Anemia    COVID-19 virus infection 01/15/2021   Diabetes mellitus without complication ([place])    pt states she was pre diabetic but is no longer. Takes no meds and does not check her blood sugar.    Endometrial cancer [facility])    Endometrial cancer ([place]) 02/21/2015   Homelessness 05/24/2020   IBS (irritable bowel syndrome)    Nocturia     Obesity    Tingling    right hand    Past Surgical History:  Procedure Laterality Date   COLONOSCOPY WITH PROPOFOL N/A 02/14/2016   Procedure: COLONOSCOPY WITH PROPOFOL;  Surgeon: Milus Banister, MD;  Location: [facility];  Service: Endoscopy;  Laterality: N/A;   HYSTEROSCOPY WITH D & C N/A 01/30/2015   Procedure: DILATATION AND CURETTAGE /HYSTEROSCOPY;  Surgeon: Azucena Fallen, MD;  Location: [facility];  Service: Gynecology;  Laterality: N/A;   ROBOTIC ASSISTED TOTAL HYSTERECTOMY WITH BILATERAL SALPINGO OOPHERECTOMY N/A 03/22/2015   Procedure: XI ROBOTIC ASSISTED TOTAL ABDOMINAL HYSTERECTOMY FOR UTERUS GREATER THAN [address] [place] WITH BILATERAL SALPINGO OOPHORECTOMY;  Surgeon: Everitt Amber, MD;  Location: [facility];  Service: Gynecology;  Laterality: N/A;   TYMPANOSTOMY TUBE PLACEMENT     bilat     Family History  Problem Relation Age of Onset   Ovarian cancer Mother    Diabetes Father    Heart attack Father    Throat cancer Paternal Uncle    Diabetes Paternal Aunt    Diabetes Maternal Uncle     Social History   Socioeconomic History   Marital status: Single    Spouse name: Not on file   Number of children: 0   Years of education: Not on file   Highest education level: Not on file  Occupational History    Employer: [employer]  Tobacco Use   Smoking status: Never   Smokeless tobacco: Never  Vaping Use   Vaping Use: Never used  Substance and Sexual Activity   Alcohol use: No    Alcohol/week: 0.0 standard drinks of alcohol   Drug use: No   Sexual activity: Not on file  Other Topics Concern   Not on file  Social History Narrative   Not on file   Social Determinants of Health   Financial Resource Strain: Not on file  Food Insecurity: Not on file  Transportation Needs: Not on file  Physical Activity: Not on file  Stress: Not on file  Social Connections: Not on file  Intimate Partner Violence: Not on file    Outpatient Medications Prior to Visit  Medication Sig Dispense  Refill   Blood Glucose Monitoring Suppl (TRUE METRIX METER) w/Device KIT Use to check blood sugar 1 kit 0   Dulaglutide (TRULICITY) 1.5 ZO/1.0RU SOPN Inject 1.5 mg into the skin once a week. 2 mL 1   fluconazole (DIFLUCAN) 100 MG tablet Take two first day then daily 14 tablet 0   glucose blood (TRUE METRIX BLOOD GLUCOSE TEST) test strip Use as instructed 100 each 12   insulin glargine-yfgn (SEMGLEE,  YFGN,) 100 UNIT/ML Pen Inject 10 Units into the skin daily. 15 mL 2   Insulin Pen Needle 31G X 5 MM MISC Use with insulin pen 100 each 2   metFORMIN (GLUCOPHAGE) 500 MG tablet Take 2 tablets (1,000 mg total) by mouth 2 (two) times daily with a meal. 120 tablet 1   TRUEplus Lancets 28G MISC Use to check blood sugar 100 each 1   No facility-administered medications prior to visit.    Allergies  Allergen Reactions   Asa [Aspirin] Hives   Latex Itching   Pineapple     Mouth itches   Tilactase Diarrhea    ROS Review of Systems  Constitutional:  Negative for chills, diaphoresis and fever.  HENT:  Negative for congestion, hearing loss, nosebleeds, sore throat and tinnitus.   Eyes:  Positive for visual disturbance. Negative for photophobia and redness.  Respiratory:  Negative for cough, shortness of breath, wheezing and stridor.   Cardiovascular:  Negative for chest pain, palpitations and leg swelling.  Gastrointestinal:  Negative for abdominal pain, blood in stool, constipation, diarrhea, nausea and vomiting.  Endocrine: Negative for polydipsia, polyphagia and polyuria.  Genitourinary:  Positive for vaginal discharge. Negative for decreased urine volume, dysuria, flank pain, frequency, hematuria, pelvic pain, urgency, vaginal bleeding and vaginal pain.  Musculoskeletal:  Negative for back pain, myalgias and neck pain.  Skin:  Negative for rash.  Allergic/Immunologic: Negative for environmental allergies.  Neurological:  Negative for dizziness, tremors, seizures, weakness, numbness and  headaches.  Hematological:  Does not bruise/bleed easily.  Psychiatric/Behavioral:  Negative for dysphoric mood, self-injury, sleep disturbance and suicidal ideas. The patient is not nervous/anxious.       Objective:    Physical Exam Vitals reviewed.  Constitutional:      Appearance: Normal appearance. She is well-developed. She is obese. She is not diaphoretic.  HENT:     Head: Normocephalic and atraumatic.     Nose: No nasal deformity, septal deviation, mucosal edema or rhinorrhea.     Right Sinus: No maxillary sinus tenderness or frontal sinus tenderness.     Left Sinus: No maxillary sinus tenderness or frontal sinus tenderness.     Mouth/Throat:     Mouth: Mucous membranes are moist.     Pharynx: Oropharynx is clear. No oropharyngeal exudate.  Eyes:     General: No scleral icterus.    Conjunctiva/sclera: Conjunctivae normal.     Pupils: Pupils are equal, round, and reactive to light.  Neck:     Thyroid: No thyromegaly.     Vascular: No carotid bruit or JVD.     Trachea: Trachea normal. No tracheal tenderness or tracheal deviation.  Cardiovascular:     Rate and Rhythm: Normal rate and regular rhythm.     Chest Wall: PMI is not displaced.     Pulses: Normal pulses. No decreased pulses.     Heart sounds: Normal heart sounds, S1 normal and S2 normal. Heart sounds not distant. No murmur heard.    No systolic murmur is present.     No diastolic murmur is present.     No friction rub. No gallop. No S3 or S4 sounds.  Pulmonary:     Effort: Pulmonary effort is normal. No tachypnea, accessory muscle usage or respiratory distress.     Breath sounds: Normal breath sounds. No stridor. No decreased breath sounds, wheezing, rhonchi or rales.  Chest:     Chest wall: No tenderness.  Abdominal:     General: Bowel sounds are normal. There  is no distension.     Palpations: Abdomen is soft. Abdomen is not rigid.     Tenderness: There is no abdominal tenderness. There is no guarding or  rebound.  Genitourinary:    Vagina: Vaginal discharge present.     Comments: Perineal area is severely involved with candidiasis also there is vaginal swelling as well whitish discharge Musculoskeletal:        General: Normal range of motion.     Cervical back: Normal range of motion and neck supple. No edema, erythema or rigidity. No muscular tenderness. Normal range of motion.  Lymphadenopathy:     Head:     Right side of head: No submental or submandibular adenopathy.     Left side of head: No submental or submandibular adenopathy.     Cervical: No cervical adenopathy.  Skin:    General: Skin is warm and dry.     Coloration: Skin is not pale.     Findings: No rash.     Nails: There is no clubbing.  Neurological:     General: No focal deficit present.     Mental Status: She is alert and oriented to person, place, and time. Mental status is at baseline.     Sensory: No sensory deficit.  Psychiatric:        Mood and Affect: Mood normal.        Speech: Speech normal.        Behavior: Behavior normal.        Thought Content: Thought content normal.        Judgment: Judgment normal.     LMP 03/15/2015  Wt Readings from Last 3 Encounters:  02/06/21 (!) 382 lb 6.4 oz (173.5 kg)  05/24/20 (!) 372 lb (168.7 kg)  10/28/19 (!) 368 lb (166.9 kg)     Health Maintenance Due  Topic Date Due   OPHTHALMOLOGY EXAM  Never done   COVID-19 Vaccine (3 - Pfizer series) 04/04/2020   FOOT EXAM  05/24/2021   URINE MICROALBUMIN  05/24/2021   PAP SMEAR-Modifier  08/04/2021    There are no preventive care reminders to display for this patient.  Lab Results  Component Value Date   TSH 2.03 11/24/2017   Lab Results  Component Value Date   WBC 6.1 11/24/2017   HGB 12.8 11/24/2017   HCT 38.4 11/24/2017   MCV 80.9 11/24/2017   PLT 360.0 11/24/2017   Lab Results  Component Value Date   NA 142 06/14/2020   K 4.5 06/14/2020   CO2 23 06/14/2020   GLUCOSE 104 (H) 06/14/2020   BUN 13  06/14/2020   CREATININE 0.68 06/14/2020   BILITOT <0.2 06/14/2020   ALKPHOS 136 (H) 06/14/2020   AST 18 06/14/2020   ALT 20 06/14/2020   PROT 7.4 06/14/2020   ALBUMIN 4.1 06/14/2020   CALCIUM 9.5 06/14/2020   ANIONGAP 8 03/23/2015   EGFR 112 06/14/2020   GFR 114.30 11/24/2017   Lab Results  Component Value Date   CHOL 181 06/14/2020   Lab Results  Component Value Date   HDL 39 (L) 06/14/2020   Lab Results  Component Value Date   LDLCALC 117 (H) 06/14/2020   Lab Results  Component Value Date   TRIG 141 06/14/2020   Lab Results  Component Value Date   CHOLHDL 4.6 (H) 06/14/2020   Lab Results  Component Value Date   HGBA1C >15 02/06/2021      Assessment & Plan:   Problem List Items Addressed This  Visit   None  No orders of the defined types were placed in this encounter. 38 minutes spent educating patient performing history and physical exam high degree of complexity and decision making confirmable with multiple consultants including clinical pharmacy  Follow-up: No follow-ups on file.    Asencion Noble, MD

## 2021-09-15 NOTE — Progress Notes (Deleted)
Established Patient Office Visit  Subjective:  Patient ID: Krista White, female    DOB: 11/04/1980  Age: 41 y.o. MRN: [id number]  CC:  No chief complaint on file.   HPI 02/2021 Krista White presents for work in visit for vaginal discharge and itching.  Patient has type 2 diabetes A1c we last saw her glucose was greater than 15.  On arrival A1c is still greater than 15 and blood sugar she states the vomiting been anywhere from 3-400.  She did not pursue the Trulicity as we prescribed because of COVID insurance cost.  She was taking the metformin 1000 mg twice daily.  She states her diet is such that she does not eat breakfast she eats a lot of processed foods for lunch a sandwich at dinnertime.  The patient is recovering from [place] for which she received pack Slo-Bid in mid December  Patient does complain of some hot flashes. 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 Takes no meds and does not check her blood sugar.    Endometrial cancer [facility])    Endometrial cancer ([place]) 02/21/2015   Homelessness 05/24/2020   IBS (irritable bowel syndrome)    Nocturia    Obesity    Tingling    right hand    Past Surgical History:  Procedure Laterality Date   COLONOSCOPY WITH PROPOFOL N/A 02/14/2016   Procedure: COLONOSCOPY WITH PROPOFOL;  Surgeon: Milus Banister, MD;  [facility];  Service: Endoscopy;  Laterality: N/A;   HYSTEROSCOPY WITH D & C N/A 01/30/2015   Procedure: DILATATION AND CURETTAGE /HYSTEROSCOPY;  Surgeon: Azucena Fallen, MD;  Location: [facility];  Service: Gynecology;  Laterality: N/A;   ROBOTIC ASSISTED TOTAL HYSTERECTOMY WITH BILATERAL SALPINGO OOPHERECTOMY N/A 03/22/2015    Procedure: XI ROBOTIC ASSISTED TOTAL ABDOMINAL HYSTERECTOMY FOR UTERUS GREATER THAN [address] [place] WITH BILATERAL SALPINGO OOPHORECTOMY;  Surgeon: Everitt Amber, MD;  Location: [facility];  Service: Gynecology;  Laterality: N/A;   TYMPANOSTOMY TUBE PLACEMENT     bilat     Family History  Problem Relation Age of Onset   Ovarian cancer Mother    Diabetes Father    Heart attack Father    Throat cancer Paternal Uncle    Diabetes Paternal Aunt    Diabetes Maternal Uncle     Social History   Socioeconomic History   Marital status: Single    Spouse name: Not on file   Number of children: 0   Years of education: Not on file   Highest education level: Not on file  Occupational History    Employer: [employer]  Tobacco Use   Smoking status: Never   Smokeless tobacco: Never  Vaping Use   Vaping Use: Never used  Substance and Sexual Activity   Alcohol use: No    Alcohol/week: 0.0 standard drinks of alcohol   Drug use: No   Sexual activity: Not on file  Other Topics Concern   Not on file  Social History Narrative   Not on file   Social Determinants of Health   Financial Resource Strain: Not on file  Food Insecurity: Not on file  Transportation Needs: Not on file  Physical Activity: Not on file  Stress: Not on file  Social Connections: Not on file  Intimate Partner Violence: Not on file    Outpatient Medications Prior to Visit  Medication Sig Dispense Refill   Blood Glucose Monitoring Suppl (TRUE METRIX METER) w/Device KIT Use to check blood sugar 1 kit 0   Dulaglutide (TRULICITY) 1.5 NL/8.9QJ SOPN Inject 1.5 mg into the skin once a week. 2 mL 1   fluconazole (DIFLUCAN) 100 MG tablet Take two first day then daily 14 tablet 0   glucose blood (TRUE METRIX BLOOD GLUCOSE TEST) test strip Use as instructed 100 each 12   insulin glargine-yfgn (SEMGLEE, YFGN,) 100 UNIT/ML Pen Inject 10 Units into the skin daily. 15 mL 2   Insulin Pen Needle 31G X 5 MM MISC Use with insulin pen 100 each 2    metFORMIN (GLUCOPHAGE) 500 MG tablet Take 2 tablets (1,000 mg total) by mouth 2 (two) times daily with a meal. 120 tablet 1   TRUEplus Lancets 28G MISC Use to check blood sugar 100 each 1   No facility-administered medications prior to visit.    Allergies  Allergen Reactions   Asa [Aspirin] Hives   Latex Itching   Pineapple     Mouth itches   Tilactase Diarrhea    ROS Review of Systems  Constitutional:  Negative for chills, diaphoresis and fever.  HENT:  Negative for congestion, hearing loss, nosebleeds, sore throat and tinnitus.   Eyes:  Positive for visual disturbance. Negative for photophobia and redness.  Respiratory:  Negative for cough, shortness of breath, wheezing and stridor.   Cardiovascular:  Negative for chest pain, palpitations and leg swelling.  Gastrointestinal:  Negative for abdominal pain, blood in stool, constipation, diarrhea, nausea and vomiting.  Endocrine: Negative for polydipsia, polyphagia and polyuria.  Genitourinary:  Positive for vaginal discharge. Negative for decreased urine volume, dysuria, flank pain, frequency, hematuria, pelvic pain, urgency, vaginal bleeding and vaginal pain.  Musculoskeletal:  Negative for back pain, myalgias and neck pain.  Skin:  Negative for rash.  Allergic/Immunologic: Negative for environmental allergies.  Neurological:  Negative for dizziness, tremors, seizures, weakness, numbness and headaches.  Hematological:  Does not bruise/bleed easily.  Psychiatric/Behavioral:  Negative for dysphoric mood, self-injury, sleep disturbance and suicidal ideas. The patient is not nervous/anxious.       Objective:    Physical Exam Vitals reviewed.  Constitutional:      Appearance: Normal appearance. She is well-developed. She is obese. She is not diaphoretic.  HENT:     Head: Normocephalic and atraumatic.     Nose: No nasal deformity, septal deviation, mucosal edema or rhinorrhea.     Right Sinus: No maxillary sinus tenderness or  frontal sinus tenderness.     Left Sinus: No maxillary sinus tenderness or frontal sinus tenderness.     Mouth/Throat:     Mouth: Mucous membranes are moist.     Pharynx: Oropharynx is clear. No oropharyngeal exudate.  Eyes:     General: No scleral icterus.    Conjunctiva/sclera: Conjunctivae normal.     Pupils: Pupils are equal, round, and reactive to light.  Neck:     Thyroid: No thyromegaly.     Vascular: No carotid bruit or JVD.     Trachea: Trachea normal. No tracheal tenderness or tracheal deviation.  Cardiovascular:     Rate and Rhythm: Normal rate and regular rhythm.     Chest Wall: PMI is not displaced.     Pulses: Normal pulses. No decreased  pulses.     Heart sounds: Normal heart sounds, S1 normal and S2 normal. Heart sounds not distant. No murmur heard.    No systolic murmur is present.     No diastolic murmur is present.     No friction rub. No gallop. No S3 or S4 sounds.  Pulmonary:     Effort: Pulmonary effort is normal. No tachypnea, accessory muscle usage or respiratory distress.     Breath sounds: Normal breath sounds. No stridor. No decreased breath sounds, wheezing, rhonchi or rales.  Chest:     Chest wall: No tenderness.  Abdominal:     General: Bowel sounds are normal. There is no distension.     Palpations: Abdomen is soft. Abdomen is not rigid.     Tenderness: There is no abdominal tenderness. There is no guarding or rebound.  Genitourinary:    Vagina: Vaginal discharge present.     Comments: Perineal area is severely involved with candidiasis also there is vaginal swelling as well whitish discharge Musculoskeletal:        General: Normal range of motion.     Cervical back: Normal range of motion and neck supple. No edema, erythema or rigidity. No muscular tenderness. Normal range of motion.  Lymphadenopathy:     Head:     Right side of head: No submental or submandibular adenopathy.     Left side of head: No submental or submandibular adenopathy.      Cervical: No cervical adenopathy.  Skin:    General: Skin is warm and dry.     Coloration: Skin is not pale.     Findings: No rash.     Nails: There is no clubbing.  Neurological:     General: No focal deficit present.     Mental Status: She is alert and oriented to person, place, and time. Mental status is at baseline.     Sensory: No sensory deficit.  Psychiatric:        Mood and Affect: Mood normal.        Speech: Speech normal.        Behavior: Behavior normal.        Thought Content: Thought content normal.        Judgment: Judgment normal.     LMP 03/15/2015  Wt Readings from Last 3 Encounters:  02/06/21 (!) 382 lb 6.4 oz (173.5 kg)  05/24/20 (!) 372 lb (168.7 kg)  10/28/19 (!) 368 lb (166.9 kg)     Health Maintenance Due  Topic Date Due   OPHTHALMOLOGY EXAM  Never done   COVID-19 Vaccine (3 - Pfizer series) 04/04/2020   FOOT EXAM  05/24/2021   URINE MICROALBUMIN  05/24/2021   HEMOGLOBIN A1C  08/06/2021   PAP SMEAR-Modifier  08/04/2021   INFLUENZA VACCINE  09/03/2021    There are no preventive care reminders to display for this patient.  Lab Results  Component Value Date   TSH 2.03 11/24/2017   Lab Results  Component Value Date   WBC 6.1 11/24/2017   HGB 12.8 11/24/2017   HCT 38.4 11/24/2017   MCV 80.9 11/24/2017   PLT 360.0 11/24/2017   Lab Results  Component Value Date   NA 142 06/14/2020   K 4.5 06/14/2020   CO2 23 06/14/2020   GLUCOSE 104 (H) 06/14/2020   BUN 13 06/14/2020   CREATININE 0.68 06/14/2020   BILITOT <0.2 06/14/2020   ALKPHOS 136 (H) 06/14/2020   AST 18 06/14/2020   ALT 20 06/14/2020  PROT 7.4 06/14/2020   ALBUMIN 4.1 06/14/2020   CALCIUM 9.5 06/14/2020   ANIONGAP 8 03/23/2015   EGFR 112 06/14/2020   GFR 114.30 11/24/2017   Lab Results  Component Value Date   CHOL 181 06/14/2020   Lab Results  Component Value Date   HDL 39 (L) 06/14/2020   Lab Results  Component Value Date   LDLCALC 117 (H) 06/14/2020   Lab  Results  Component Value Date   TRIG 141 06/14/2020   Lab Results  Component Value Date   CHOLHDL 4.6 (H) 06/14/2020   Lab Results  Component Value Date   HGBA1C >15 02/06/2021      Assessment & Plan:   Problem List Items Addressed This Visit   None  No orders of the defined types were placed in this encounter. 38 minutes spent educating patient performing history and physical exam high degree of complexity and decision making confirmable with multiple consultants including clinical pharmacy  Follow-up: No follow-ups on file.    Asencion Noble, MD

## 2021-09-16 ENCOUNTER — Ambulatory Visit: Payer: BC Managed Care – PPO | Admitting: Critical Care Medicine

## 2021-11-12 ENCOUNTER — Other Ambulatory Visit: Payer: Self-pay | Admitting: Critical Care Medicine

## 2021-11-12 ENCOUNTER — Encounter (HOSPITAL_BASED_OUTPATIENT_CLINIC_OR_DEPARTMENT_OTHER): Payer: BC Managed Care – PPO | Admitting: Critical Care Medicine

## 2021-11-12 DIAGNOSIS — E1165 Type 2 diabetes mellitus with hyperglycemia: Secondary | ICD-10-CM

## 2021-11-12 DIAGNOSIS — K047 Periapical abscess without sinus: Secondary | ICD-10-CM

## 2021-11-12 DIAGNOSIS — G501 Atypical facial pain: Secondary | ICD-10-CM

## 2021-11-12 MED ORDER — AMOXICILLIN-POT CLAVULANATE 875-125 MG PO TABS: 1.0000 | ORAL_TABLET | Freq: Two times a day (BID) | ORAL | 0 refills | Status: DC

## 2021-11-12 NOTE — Telephone Encounter (Signed)
Please see the MyChart message reply(ies) for my assessment and plan.    This patient gave consent for this Medical Advice Message and is aware that it may result in a bill to [employer], as well as the possibility of receiving a bill for a co-payment or deductible. They are an established patient, but are not seeking medical advice exclusively about a problem treated during an in person or video visit in the last seven days. I did not recommend an in person or video visit within seven days of my reply.    I spent a total of 10 minutes cumulative time within 7 days through [employer].  Asencion Noble, MD

## 2021-11-12 NOTE — Progress Notes (Signed)
Dental referral

## 2021-11-19 ENCOUNTER — Other Ambulatory Visit: Payer: Self-pay

## 2021-11-25 ENCOUNTER — Other Ambulatory Visit: Payer: Self-pay

## 2021-11-27 ENCOUNTER — Other Ambulatory Visit: Payer: Self-pay | Admitting: Critical Care Medicine

## 2021-11-29 ENCOUNTER — Other Ambulatory Visit (HOSPITAL_COMMUNITY): Payer: Self-pay

## 2022-01-21 ENCOUNTER — Other Ambulatory Visit: Payer: Self-pay

## 2022-02-12 ENCOUNTER — Encounter (HOSPITAL_COMMUNITY): Payer: Self-pay | Admitting: Emergency Medicine

## 2022-02-12 ENCOUNTER — Ambulatory Visit (HOSPITAL_COMMUNITY)
Admission: EM | Admit: 2022-02-12 | Discharge: 2022-02-12 | Disposition: A | Discharge status: Home / Self Care | Payer: BC Managed Care – PPO | Attending: Family Medicine | Admitting: Family Medicine

## 2022-02-12 DIAGNOSIS — J069 Acute upper respiratory infection, unspecified: Secondary | ICD-10-CM | POA: Diagnosis not present

## 2022-02-12 DIAGNOSIS — H66001 Acute suppurative otitis media without spontaneous rupture of ear drum, right ear: Secondary | ICD-10-CM | POA: Diagnosis not present

## 2022-02-12 MED ORDER — AMOXICILLIN 875 MG PO TABS: 875.0000 mg | ORAL_TABLET | Freq: Two times a day (BID) | ORAL | 0 refills | Status: AC

## 2022-02-12 NOTE — ED Triage Notes (Signed)
Pt c./o body aches, bilat ear pain for 3 days. Pain gets worse at night. Hasn't had medications for symptoms.

## 2022-02-12 NOTE — ED Provider Notes (Signed)
[place]   [id number] 02/12/22 Arrival Time: 5366  ASSESSMENT & PLAN:  1. Viral upper respiratory tract infection   2. Non-recurrent acute suppurative otitis media of right ear without spontaneous rupture of tympanic membrane    Discussed typical duration of likely viral illness with beginning R OM. OTC symptom care as needed.  Discharge Medication List as of 02/12/2022  2:17 PM     START taking these medications   Details  amoxicillin (AMOXIL) 875 MG tablet Take 1 tablet (875 mg total) by mouth 2 (two) times daily for 7 days., Starting Wed 02/12/2022, Until Wed 02/19/2022, Normal         Follow-up Information     Elsie Stain, MD.   Specialty: Pulmonary Disease Why: If worsening or failing to improve as anticipated. Contact information: [address] [place] [address] [phone]                 Reviewed expectations re: course of current medical issues. Questions answered. Outlined signs and symptoms indicating need for more acute intervention. Understanding verbalized. After Visit Summary given.   SUBJECTIVE: History from: Patient. LEONIA HEATHERLY is a 42 y.o. female. Reports: body aches, bilat ear pain for 3 days (R>>L). H/O ear infections. Ear pain gets worse at night. No tx PTA.  Normal PO intake without n/v/d.  OBJECTIVE:  Vitals:   02/12/22 1348  BP: (!) 142/91  Pulse: 87  Resp: 19  Temp: 98.5 F (36.9 C)  TempSrc: Oral  SpO2: 100%    General appearance: alert; no distress Eyes: PERRLA; EOMI; conjunctiva normal HENT: [place]; AT; with nasal congestion; R TM with moderate erythema and bulging Neck: supple  Lungs: speaks full sentences without difficulty; unlabored Extremities: no edema Skin: warm and dry Neurologic: normal gait Psychological: alert and cooperative; normal mood and affect   Allergies  Allergen Reactions   Asa [Aspirin] Hives   Latex Itching   Pineapple     Mouth itches   Tilactase  Diarrhea    Past Medical History:  Diagnosis Date   Anemia    COVID-19 virus infection 01/15/2021   Diabetes mellitus without complication ([place])    pt states she was pre diabetic but is no longer. Takes no meds and does not check her blood sugar.    Endometrial cancer [facility])    Endometrial cancer ([place]) 02/21/2015   Homelessness 05/24/2020   IBS (irritable bowel syndrome)    Nocturia    Obesity    Tingling    right hand   Social History   Socioeconomic History   Marital status: Single    Spouse name: Not on file   Number of children: 0   Years of education: Not on file   Highest education level: Not on file  Occupational History    Employer: [employer]  Tobacco Use   Smoking status: Never   Smokeless tobacco: Never  Vaping Use   Vaping Use: Never used  Substance and Sexual Activity   Alcohol use: No    Alcohol/week: 0.0 standard drinks of alcohol   Drug use: No   Sexual activity: Not on file  Other Topics Concern   Not on file  Social History Narrative   Not on file   Social Determinants of Health   Financial Resource Strain: Not on file  Food Insecurity: Not on file  Transportation Needs: Not on file  Physical Activity: Not on file  Stress: Not on file  Social Connections: Not  on file  Intimate Partner Violence: Not on file   Family History  Problem Relation Age of Onset   Ovarian cancer Mother    Diabetes Father    Heart attack Father    Throat cancer Paternal Uncle    Diabetes Paternal Aunt    Diabetes Maternal Uncle    Past Surgical History:  Procedure Laterality Date   COLONOSCOPY WITH PROPOFOL N/A 02/14/2016   Procedure: COLONOSCOPY WITH PROPOFOL;  Surgeon: Milus Banister, MD;  Location: [facility];  Service: Endoscopy;  Laterality: N/A;   HYSTEROSCOPY WITH D & C N/A 01/30/2015   Procedure: DILATATION AND CURETTAGE /HYSTEROSCOPY;  Surgeon: Azucena Fallen, MD;  Location: [facility];  Service: Gynecology;  Laterality: N/A;   ROBOTIC ASSISTED TOTAL  HYSTERECTOMY WITH BILATERAL SALPINGO OOPHERECTOMY N/A 03/22/2015   Procedure: XI ROBOTIC ASSISTED TOTAL ABDOMINAL HYSTERECTOMY FOR UTERUS GREATER THAN [address] WITH BILATERAL SALPINGO OOPHORECTOMY;  Surgeon: Everitt Amber, MD;  Location: [facility];  Service: Gynecology;  Laterality: N/A;   TYMPANOSTOMY TUBE PLACEMENT     bilat      Vanessa Kick, MD 02/12/22 1506

## 2022-03-05 ENCOUNTER — Other Ambulatory Visit (HOSPITAL_COMMUNITY): Payer: Self-pay

## 2022-03-05 ENCOUNTER — Encounter: Payer: Self-pay | Admitting: Critical Care Medicine

## 2022-03-06 ENCOUNTER — Telehealth: Payer: Self-pay | Admitting: Pharmacist

## 2022-03-06 ENCOUNTER — Other Ambulatory Visit: Payer: Self-pay

## 2022-03-06 ENCOUNTER — Other Ambulatory Visit: Payer: Self-pay | Admitting: Pharmacist

## 2022-03-06 NOTE — Telephone Encounter (Signed)
Patient has bullous lesions on her back I need to see her ASAP please triple book Krista White the patient states honorably bcbs plan her Trulicity has a high co-pay can you investigate to see if there are other alternatives for this patient

## 2022-03-06 NOTE — Telephone Encounter (Signed)
Submitted a prior auth via CoverMyMeds today Key: [id number]

## 2022-03-06 NOTE — Telephone Encounter (Signed)
Patient's Trulicity requires a PA. Krista White is initiating one and will let us know when/if it's approved.

## 2022-03-07 ENCOUNTER — Other Ambulatory Visit: Payer: Self-pay

## 2022-03-11 ENCOUNTER — Ambulatory Visit: Payer: BC Managed Care – PPO | Attending: Critical Care Medicine | Admitting: Critical Care Medicine

## 2022-03-11 ENCOUNTER — Encounter: Payer: Self-pay | Admitting: Critical Care Medicine

## 2022-03-11 ENCOUNTER — Other Ambulatory Visit: Payer: Self-pay

## 2022-03-11 VITALS — BP 144/96 | HR 94 | Ht 64.0 in | Wt 380.8 lb

## 2022-03-11 DIAGNOSIS — Z6841 Body Mass Index (BMI) 40.0 and over, adult: Secondary | ICD-10-CM

## 2022-03-11 DIAGNOSIS — T148XXA Other injury of unspecified body region, initial encounter: Secondary | ICD-10-CM | POA: Diagnosis not present

## 2022-03-11 DIAGNOSIS — E1165 Type 2 diabetes mellitus with hyperglycemia: Secondary | ICD-10-CM

## 2022-03-11 DIAGNOSIS — Z794 Long term (current) use of insulin: Secondary | ICD-10-CM | POA: Diagnosis not present

## 2022-03-11 LAB — POCT GLYCOSYLATED HEMOGLOBIN (HGB A1C): HbA1c POC (<> result, manual entry): >15 % (ref 4.0–5.6)

## 2022-03-11 LAB — GLUCOSE, POCT (MANUAL RESULT ENTRY): POC Glucose: 473 mg/dl — AB (ref 70–99)

## 2022-03-11 MED ORDER — METFORMIN HCL ER 750 MG PO TB24: 750.0000 mg | ORAL_TABLET | Freq: Every day | ORAL | 2 refills | Status: DC

## 2022-03-11 MED ORDER — INSULIN GLARGINE (2 UNIT DIAL) 300 UNIT/ML ~~LOC~~ SOPN: 10.0000 [IU] | PEN_INJECTOR | Freq: Every day | SUBCUTANEOUS | 2 refills | Status: DC

## 2022-03-11 MED ORDER — INSULIN PEN NEEDLE 31G X 5 MM MISC: 2 refills | Status: DC

## 2022-03-11 MED ORDER — CONTOUR NEXT TEST VI STRP: ORAL_STRIP | 12 refills | Status: DC

## 2022-03-11 MED ORDER — ACCU-CHEK SOFTCLIX LANCETS MISC: 12 refills | Status: DC

## 2022-03-11 MED ORDER — METFORMIN HCL 500 MG PO TABS: 1000.0000 mg | ORAL_TABLET | Freq: Two times a day (BID) | ORAL | 1 refills | Status: DC

## 2022-03-11 MED ORDER — SULFAMETHOXAZOLE-TRIMETHOPRIM 800-160 MG PO TABS: 1.0000 | ORAL_TABLET | Freq: Two times a day (BID) | ORAL | 0 refills | Status: AC

## 2022-03-11 MED ORDER — CONTOUR NEXT MONITOR W/DEVICE KIT: PACK | 0 refills | Status: DC

## 2022-03-11 MED ORDER — VALSARTAN 80 MG PO TABS: 80.0000 mg | ORAL_TABLET | Freq: Every day | ORAL | 3 refills | Status: DC

## 2022-03-11 MED ORDER — TRULICITY 1.5 MG/0.5ML ~~LOC~~ SOAJ: 1.5000 mg | SUBCUTANEOUS | 1 refills | Status: DC

## 2022-03-11 NOTE — Progress Notes (Signed)
Established Patient Office Visit  Subjective:  Patient ID: Krista White, female    DOB: 06-Jun-1980  Age: 42 y.o. MRN: [id number]  CC:  Chief Complaint  Patient presents with   Diabetes   Hypertension   Cyst    On patients back     HPI 02/06/21 Krista White presents for work in visit for vaginal discharge and itching.  Patient has type 2 diabetes A1c we last saw her glucose was greater than 15.  On arrival A1c is still greater than 15 and blood sugar she states the vomiting been anywhere from 3-400.  She did not pursue the Trulicity as we prescribed because of COVID insurance cost.  She was taking the metformin 1000 mg twice daily.  She states her diet is such that she does not eat breakfast she eats a lot of processed foods for lunch a sandwich at dinnertime.  The patient is recovering from [place] for which she received pack Slo-Bid in mid December  Patient does complain of some hot flashes. 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  Her weight has gone up significantly as well.  She has not been seen in a considerable period of time and on arrival A1c greater than 15 blood sugar 473 blood pressure 144/96. She needs a urine microalbumin foot exam and Pap smear.  Patient is off all medications.  Past Medical History:  Diagnosis Date   Anemia    COVID-19 virus infection 01/15/2021   Diabetes mellitus without complication ([place])    pt states she was pre diabetic but is no longer. Takes no meds and does not check her blood sugar.    Endometrial cancer [facility])    Endometrial cancer ([place]) 02/21/2015   Homelessness 05/24/2020   IBS (irritable bowel syndrome)    Nocturia    Obesity    Tingling    right hand   Vaginal yeast  infection 02/06/2021    Past Surgical History:  Procedure Laterality Date   COLONOSCOPY WITH PROPOFOL N/A 02/14/2016   Procedure: COLONOSCOPY WITH PROPOFOL;  Surgeon: Milus Banister, MD;  Location: [facility];  Service: Endoscopy;  Laterality: N/A;   HYSTEROSCOPY WITH D & C N/A 01/30/2015   Procedure: DILATATION AND CURETTAGE /HYSTEROSCOPY;  Surgeon: Azucena Fallen, MD;  Location: [facility];  Service: Gynecology;  Laterality: N/A;   ROBOTIC ASSISTED TOTAL HYSTERECTOMY WITH BILATERAL SALPINGO OOPHERECTOMY N/A 03/22/2015   Procedure: XI ROBOTIC ASSISTED TOTAL ABDOMINAL HYSTERECTOMY FOR UTERUS GREATER THAN [address] WITH BILATERAL SALPINGO OOPHORECTOMY;  Surgeon: Everitt Amber, MD;  Location: [facility];  Service: Gynecology;  Laterality: N/A;   TYMPANOSTOMY TUBE PLACEMENT     bilat     Family History  Problem Relation Age of Onset   Ovarian cancer Mother    Diabetes Father    Heart attack Father    Throat cancer Paternal Uncle    Diabetes Paternal Aunt    Diabetes Maternal Uncle     Social History   Socioeconomic History   Marital status: Single    Spouse name: Not on file   Number of children: 0   Years of education: Not on file   Highest education level: Not on file  Occupational History  Employer: [employer]  Tobacco Use   Smoking status: Never   Smokeless tobacco: Never  Vaping Use   Vaping Use: Never used  Substance and Sexual Activity   Alcohol use: No    Alcohol/week: 0.0 standard drinks of alcohol   Drug use: No   Sexual activity: Not on file  Other Topics Concern   Not on file  Social History Narrative   Not on file   Social Determinants of Health   Financial Resource Strain: Not on file  Food Insecurity: Not on file  Transportation Needs: Not on file  Physical Activity: Not on file  Stress: Not on file  Social Connections: Not on file  Intimate Partner Violence: Not on file    Outpatient Medications Prior to Visit  Medication Sig Dispense Refill   Blood  Glucose Monitoring Suppl (TRUE METRIX METER) w/Device KIT Use to check blood sugar (Patient not taking: Reported on 03/11/2022) 1 kit 0   Dulaglutide (TRULICITY) 1.5 MI/6.8EH SOPN Inject 1.5 mg into the skin once a week. (Patient not taking: Reported on 03/11/2022) 2 mL 1   glucose blood (TRUE METRIX BLOOD GLUCOSE TEST) test strip Use as instructed (Patient not taking: Reported on 03/11/2022) 100 each 12   Insulin Pen Needle 31G X 5 MM MISC Use with insulin pen (Patient not taking: Reported on 03/11/2022) 100 each 2   metFORMIN (GLUCOPHAGE) 500 MG tablet Take 2 tablets (1,000 mg total) by mouth 2 (two) times daily with a meal. (Patient not taking: Reported on 03/11/2022) 120 tablet 1   TRUEplus Lancets 28G MISC Use to check blood sugar (Patient not taking: Reported on 03/11/2022) 100 each 1   No facility-administered medications prior to visit.    Allergies  Allergen Reactions   Asa [Aspirin] Hives   Latex Itching   Pineapple     Mouth itches   Tilactase Diarrhea    ROS Review of Systems  Constitutional:  Negative for chills, diaphoresis and fever.  HENT:  Negative for congestion, hearing loss, nosebleeds, sore throat and tinnitus.   Eyes:  Negative for photophobia, redness and visual disturbance.  Respiratory:  Negative for cough, shortness of breath, wheezing and stridor.   Cardiovascular:  Negative for chest pain, palpitations and leg swelling.  Gastrointestinal:  Negative for abdominal pain, blood in stool, constipation, diarrhea, nausea and vomiting.  Endocrine: Negative for polydipsia, polyphagia and polyuria.  Genitourinary:  Negative for decreased urine volume, dysuria, flank pain, frequency, hematuria, pelvic pain, urgency, vaginal bleeding, vaginal discharge and vaginal pain.  Musculoskeletal:  Negative for back pain, myalgias and neck pain.  Skin:  Positive for wound. Negative for rash.  Allergic/Immunologic: Negative for environmental allergies.  Neurological:  Negative for dizziness,  tremors, seizures, weakness, numbness and headaches.  Hematological:  Does not bruise/bleed easily.  Psychiatric/Behavioral:  Negative for dysphoric mood, self-injury, sleep disturbance and suicidal ideas. The patient is not nervous/anxious.       Objective:    Physical Exam Vitals reviewed.  Constitutional:      Appearance: Normal appearance. She is well-developed. She is obese. She is not diaphoretic.  HENT:     Head: Normocephalic and atraumatic.     Nose: No nasal deformity, septal deviation, mucosal edema or rhinorrhea.     Right Sinus: No maxillary sinus tenderness or frontal sinus tenderness.     Left Sinus: No maxillary sinus tenderness or frontal sinus tenderness.     Mouth/Throat:     Mouth: Mucous membranes are moist.  Pharynx: Oropharynx is clear. No oropharyngeal exudate.  Eyes:     General: No scleral icterus.    Conjunctiva/sclera: Conjunctivae normal.     Pupils: Pupils are equal, round, and reactive to light.  Neck:     Thyroid: No thyromegaly.     Vascular: No carotid bruit or JVD.     Trachea: Trachea normal. No tracheal tenderness or tracheal deviation.  Cardiovascular:     Rate and Rhythm: Normal rate and regular rhythm.     Chest Wall: PMI is not displaced.     Pulses: Normal pulses. No decreased pulses.     Heart sounds: Normal heart sounds, S1 normal and S2 normal. Heart sounds not distant. No murmur heard.    No systolic murmur is present.     No diastolic murmur is present.     No friction rub. No gallop. No S3 or S4 sounds.  Pulmonary:     Effort: Pulmonary effort is normal. No tachypnea, accessory muscle usage or respiratory distress.     Breath sounds: Normal breath sounds. No stridor. No decreased breath sounds, wheezing, rhonchi or rales.  Chest:     Chest wall: No tenderness.  Abdominal:     General: Bowel sounds are normal. There is no distension.     Palpations: Abdomen is soft. Abdomen is not rigid.     Tenderness: There is no  abdominal tenderness. There is no guarding or rebound.  Genitourinary:    Vagina: No vaginal discharge.     Comments: Perineal area is severely involved with candidiasis also there is vaginal swelling as well whitish discharge Musculoskeletal:        General: Normal range of motion.     Cervical back: Normal range of motion and neck supple. No edema, erythema or rigidity. No muscular tenderness. Normal range of motion.  Lymphadenopathy:     Head:     Right side of head: No submental or submandibular adenopathy.     Left side of head: No submental or submandibular adenopathy.     Cervical: No cervical adenopathy.  Skin:    General: Skin is warm and dry.     Coloration: Skin is not pale.     Findings: Lesion present. No rash.     Nails: There is no clubbing.     Comments: Wound over lower back draining  Neurological:     General: No focal deficit present.     Mental Status: She is alert and oriented to person, place, and time. Mental status is at baseline.     Sensory: No sensory deficit.  Psychiatric:        Mood and Affect: Mood normal.        Speech: Speech normal.        Behavior: Behavior normal.        Thought Content: Thought content normal.        Judgment: Judgment normal.     BP (!) 144/96   Pulse 94   Ht '5\' 4"'$  (1.626 m)   Wt (!) 380 lb 12.8 oz (172.7 kg)   LMP 03/15/2015   SpO2 91%   BMI 65.36 kg/m  Wt Readings from Last 3 Encounters:  03/11/22 (!) 380 lb 12.8 oz (172.7 kg)  02/06/21 (!) 382 lb 6.4 oz (173.5 kg)  05/24/20 (!) 372 lb (168.7 kg)     Health Maintenance Due  Topic Date Due   OPHTHALMOLOGY EXAM  Never done   Diabetic kidney evaluation - Urine ACR  05/24/2021  FOOT EXAM  05/24/2021   Diabetic kidney evaluation - eGFR measurement  06/14/2021   PAP SMEAR-Modifier  08/04/2021   INFLUENZA VACCINE  09/03/2021   COVID-19 Vaccine (4 - 2023-24 season) 10/04/2021   DTaP/Tdap/Td (3 - Td or Tdap) 10/12/2021    There are no preventive care reminders  to display for this patient.  Lab Results  Component Value Date   TSH 2.03 11/24/2017   Lab Results  Component Value Date   WBC 6.1 11/24/2017   HGB 12.8 11/24/2017   HCT 38.4 11/24/2017   MCV 80.9 11/24/2017   PLT 360.0 11/24/2017   Lab Results  Component Value Date   NA 142 06/14/2020   K 4.5 06/14/2020   CO2 23 06/14/2020   GLUCOSE 104 (H) 06/14/2020   BUN 13 06/14/2020   CREATININE 0.68 06/14/2020   BILITOT <0.2 06/14/2020   ALKPHOS 136 (H) 06/14/2020   AST 18 06/14/2020   ALT 20 06/14/2020   PROT 7.4 06/14/2020   ALBUMIN 4.1 06/14/2020   CALCIUM 9.5 06/14/2020   ANIONGAP 8 03/23/2015   EGFR 112 06/14/2020   GFR 114.30 11/24/2017   Lab Results  Component Value Date   CHOL 181 06/14/2020   Lab Results  Component Value Date   HDL 39 (L) 06/14/2020   Lab Results  Component Value Date   LDLCALC 117 (H) 06/14/2020   Lab Results  Component Value Date   TRIG 141 06/14/2020   Lab Results  Component Value Date   CHOLHDL 4.6 (H) 06/14/2020   Lab Results  Component Value Date   HGBA1C >15 03/11/2022      Assessment & Plan:   Problem List Items Addressed This Visit       Endocrine   Uncontrolled type 2 diabetes mellitus with hyperglycemia, with long-term current use of insulin ([place]) - Primary    Poorly controlled diabetes secondary to lack of access and uses medications Begin Trulicity once weekly at a dose of 1.5 mg Begin insulin glargine 10 units daily  Assess renal panel and urine for microalbumin will defer foot exam to the next visit      Relevant Medications   Dulaglutide (TRULICITY) 1.5 XN/1.7GY SOPN   insulin glargine, 2 Unit Dial, (TOUJEO MAX) 300 UNIT/ML Solostar Pen   metFORMIN (GLUCOPHAGE-XR) 750 MG 24 hr tablet   valsartan (DIOVAN) 80 MG tablet     Other   Open wound    Open wound on the back will refer to wound clinic      Relevant Orders   AMB referral to wound care center   Other Visit Diagnoses     Type 2 diabetes  mellitus with hyperglycemia, without long-term current use of insulin ([facility])       Relevant Medications   Dulaglutide (TRULICITY) 1.5 FV/4.9SW SOPN   insulin glargine, 2 Unit Dial, (TOUJEO MAX) 300 UNIT/ML Solostar Pen   metFORMIN (GLUCOPHAGE-XR) 750 MG 24 hr tablet   valsartan (DIOVAN) 80 MG tablet   Other Relevant Orders   HgB A1c (Completed)   Microalbumin / creatinine urine ratio   Comprehensive metabolic panel   CBC with Differential/Platelet   POCT glucose (manual entry) (Completed)      Meds ordered this encounter  Medications   Dulaglutide (TRULICITY) 1.5 HQ/7.5FF SOPN    Sig: Inject 1.5 mg into the skin once a week.    Dispense:  2 mL    Refill:  1   DISCONTD: metFORMIN (GLUCOPHAGE) 500 MG tablet    Sig: Take  2 tablets (1,000 mg total) by mouth 2 (two) times daily with a meal.    Dispense:  120 tablet    Refill:  1    For future fills   Blood Glucose Monitoring Suppl (CONTOUR NEXT MONITOR) w/Device KIT    Sig: Use to check blood sugar three times per day    Dispense:  1 kit    Refill:  0   glucose blood (CONTOUR NEXT TEST) test strip    Sig: Use as instructed    Dispense:  100 each    Refill:  12   Accu-Chek Softclix Lancets lancets    Sig: Use as instructed    Dispense:  100 each    Refill:  12   insulin glargine, 2 Unit Dial, (TOUJEO MAX) 300 UNIT/ML Solostar Pen    Sig: Inject 10 Units into the skin daily.    Dispense:  15 mL    Refill:  2   Insulin Pen Needle 31G X 5 MM MISC    Sig: Use with insulin pen    Dispense:  100 each    Refill:  2   sulfamethoxazole-trimethoprim (BACTRIM DS) 800-160 MG tablet    Sig: Take 1 tablet by mouth 2 (two) times daily for 7 days.    Dispense:  14 tablet    Refill:  0   metFORMIN (GLUCOPHAGE-XR) 750 MG 24 hr tablet    Sig: Take 1 tablet (750 mg total) by mouth daily with breakfast.    Dispense:  90 tablet    Refill:  2   valsartan (DIOVAN) 80 MG tablet    Sig: Take 1 tablet (80 mg total) by mouth daily.    Dispense:   90 tablet    Refill:  3    Follow-up: Return in about 6 weeks (around 04/22/2022) for diabetes, htn.    Asencion Noble, MD

## 2022-03-11 NOTE — Patient Instructions (Addendum)
Labs today : urine for protein, blood work for diabetes Start metformin '750mg'$  daily XR Start trulicity as prescribed Start insulin glargine 10units daily For blood pressure start valsartan '80mg'$  daily Referral to wound care will be made asap  See Our clinical pharmacist [facility] in 3weeks   See Dr Joya Gaskins 6 weeks

## 2022-03-11 NOTE — Assessment & Plan Note (Signed)
Open wound on the back will refer to wound clinic

## 2022-03-11 NOTE — Assessment & Plan Note (Signed)
Poorly controlled diabetes secondary to lack of access and uses medications Begin Trulicity once weekly at a dose of 1.5 mg Begin insulin glargine 10 units daily  Assess renal panel and urine for microalbumin will defer foot exam to the next visit

## 2022-03-11 NOTE — Assessment & Plan Note (Signed)
Will need to address with lifestyle management approach once patient returns

## 2022-03-14 ENCOUNTER — Telehealth: Payer: Self-pay

## 2022-03-14 LAB — COMPREHENSIVE METABOLIC PANEL
ALT: 18 IU/L (ref 0–32)
AST: 12 IU/L (ref 0–40)
Albumin/Globulin Ratio: 1.3 (ref 1.2–2.2)
Albumin: 4.5 g/dL (ref 3.9–4.9)
Alkaline Phosphatase: 140 IU/L — ABNORMAL HIGH (ref 44–121)
BUN/Creatinine Ratio: 17 (ref 9–23)
BUN: 15 mg/dL (ref 6–24)
Bilirubin Total: <0.2 mg/dL (ref 0.0–1.2)
CO2: 24 mmol/L (ref 20–29)
Calcium: 9.9 mg/dL (ref 8.7–10.2)
Chloride: 96 mmol/L (ref 96–106)
Creatinine, Ser: 0.86 mg/dL (ref 0.57–1.00)
Globulin, Total: 3.5 g/dL (ref 1.5–4.5)
Glucose: 425 mg/dL — ABNORMAL HIGH (ref 70–99)
Potassium: 4.5 mmol/L (ref 3.5–5.2)
Sodium: 136 mmol/L (ref 134–144)
Total Protein: 8 g/dL (ref 6.0–8.5)
eGFR: 86 mL/min/{1.73_m2} (ref >59)

## 2022-03-14 LAB — CBC WITH DIFFERENTIAL/PLATELET
Basophils Absolute: 0 10*3/uL (ref 0.0–0.2)
Basos: 0 %
EOS (ABSOLUTE): 0.1 10*3/uL (ref 0.0–0.4)
Eos: 1 %
Hematocrit: 42.1 % (ref 34.0–46.6)
Hemoglobin: 14.1 g/dL (ref 11.1–15.9)
Immature Grans (Abs): 0 10*3/uL (ref 0.0–0.1)
Immature Granulocytes: 0 %
Lymphocytes Absolute: 4.1 10*3/uL — ABNORMAL HIGH (ref 0.7–3.1)
Lymphs: 38 %
MCH: 28 pg (ref 26.6–33.0)
MCHC: 33.5 g/dL (ref 31.5–35.7)
MCV: 84 fL (ref 79–97)
Monocytes Absolute: 0.5 10*3/uL (ref 0.1–0.9)
Monocytes: 4 %
Neutrophils Absolute: 6.1 10*3/uL (ref 1.4–7.0)
Neutrophils: 57 %
Platelets: 484 10*3/uL — ABNORMAL HIGH (ref 150–450)
RBC: 5.03 x10E6/uL (ref 3.77–5.28)
RDW: 12.5 % (ref 11.7–15.4)
WBC: 10.8 10*3/uL (ref 3.4–10.8)

## 2022-03-14 LAB — MICROALBUMIN / CREATININE URINE RATIO
Creatinine, Urine: 51.7 mg/dL
Microalb/Creat Ratio: 9 mg/g creat (ref 0–29)
Microalbumin, Urine: 4.4 ug/mL

## 2022-03-14 NOTE — Telephone Encounter (Signed)
Pt was called and vm was left, Information has been sent to nurse pool.   

## 2022-03-14 NOTE — Progress Notes (Signed)
Let pt know liver kidney,blood count, urine test all normal

## 2022-03-14 NOTE — Telephone Encounter (Signed)
-----   Message from Elsie Stain, MD sent at 03/14/2022  5:58 AM EST ----- Let pt know liver kidney,blood count, urine test all normal

## 2022-04-04 ENCOUNTER — Encounter (HOSPITAL_BASED_OUTPATIENT_CLINIC_OR_DEPARTMENT_OTHER): Payer: BC Managed Care – PPO | Admitting: General Surgery

## 2022-04-08 ENCOUNTER — Ambulatory Visit: Payer: BC Managed Care – PPO | Admitting: Pharmacist

## 2022-04-08 ENCOUNTER — Other Ambulatory Visit: Payer: Self-pay

## 2022-04-08 ENCOUNTER — Encounter: Payer: Self-pay | Admitting: Pharmacist

## 2022-04-08 VITALS — BP 137/85 | HR 80

## 2022-04-08 DIAGNOSIS — E1165 Type 2 diabetes mellitus with hyperglycemia: Secondary | ICD-10-CM

## 2022-04-08 DIAGNOSIS — Z794 Long term (current) use of insulin: Secondary | ICD-10-CM

## 2022-04-08 MED ORDER — CONTOUR NEXT MONITOR W/DEVICE KIT
PACK | 0 refills | Status: DC
  Filled 2022-04-08: qty 1, 30d supply, fill #0

## 2022-04-08 MED ORDER — TRULICITY 1.5 MG/0.5ML ~~LOC~~ SOAJ
1.5000 mg | SUBCUTANEOUS | 1 refills | Status: DC
  Filled 2022-04-08 – 2022-06-19 (×5): qty 2, 28d supply, fill #0

## 2022-04-08 MED ORDER — ONETOUCH DELICA LANCETS 33G MISC
3 refills | Status: AC
  Filled 2022-04-08: qty 100, 33d supply, fill #0

## 2022-04-08 MED ORDER — METFORMIN HCL ER 750 MG PO TB24
750.0000 mg | ORAL_TABLET | Freq: Every day | ORAL | 2 refills | Status: DC
  Filled 2022-04-08: qty 90, 90d supply, fill #0
  Filled 2022-10-29: qty 90, 90d supply, fill #1

## 2022-04-08 MED ORDER — INSULIN GLARGINE (2 UNIT DIAL) 300 UNIT/ML ~~LOC~~ SOPN
10.0000 [IU] | PEN_INJECTOR | Freq: Every day | SUBCUTANEOUS | 2 refills | Status: DC
  Filled 2022-04-08 – 2022-04-10 (×4): qty 3, 30d supply, fill #0

## 2022-04-08 MED ORDER — ONETOUCH VERIO W/DEVICE KIT
PACK | 0 refills | Status: AC
  Filled 2022-04-08: qty 1, 30d supply, fill #0

## 2022-04-08 MED ORDER — CONTOUR NEXT TEST VI STRP
ORAL_STRIP | 12 refills | Status: DC
  Filled 2022-04-08: qty 100, 25d supply, fill #0

## 2022-04-08 MED ORDER — MICROLET LANCETS MISC
3 refills | Status: DC
  Filled 2022-04-08: qty 100, 25d supply, fill #0

## 2022-04-08 MED ORDER — ONETOUCH VERIO VI STRP
ORAL_STRIP | 3 refills | Status: AC
  Filled 2022-04-08: qty 100, 33d supply, fill #0

## 2022-04-08 NOTE — Progress Notes (Signed)
    S:     No chief complaint on file.  Patient arrives in good spirits.  Presents for diabetes evaluation, education, and management. Patient was referred and last seen by Primary Care Provider on 03/11/22.  Today, pt tells me she stopped glipizide. Endorses compliance with metformin and Trulicity. Tolerating Trulicity well. Denies NV, abdominal pain. Endorses occasional diarrhea with metformin but experiences this when she eats fruit with high fiber content.   Family/Social History:  FHx: DM Tobacco: never smoker  Alcohol: none  Insurance coverage/medication affordability: self pay  Medication adherence DENIED. She is not taking metformin or insulin. She is taking Trulicity.   Current diabetes medications include: dulaglutide 1.5 mg weekly, metformin 750 mg XR (not taking), Toujeo 10 units daily (not taking)  Patient denies hypoglycemia.    Patient reported dietary habits: - Admits to struggling with pasta and bread   Patient-reported exercise habits:  - Walks daily    Patient denies nocturia (nighttime urination).  Patient denies neuropathy (nerve pain). Patient denies visual changes. Patient reports self foot exams.    No meter. Does not check blood sugar at home. Jenetta Downer:   Lab Results  Component Value Date   HGBA1C >15 03/11/2022   Vitals:   04/08/22 1123  BP: 137/85  Pulse: 80   Lipid Panel     Component Value Date/Time   CHOL 181 06/14/2020 1449   TRIG 141 06/14/2020 1449   HDL 39 (L) 06/14/2020 1449   CHOLHDL 4.6 (H) 06/14/2020 1449   LDLCALC 117 (H) 06/14/2020 1449    Clinical Atherosclerotic Cardiovascular Disease (ASCVD): No  The 10-year ASCVD risk score (Arnett DK, et al., 2019) is: 5.2%   Values used to calculate the score:     Age: 42 years     Sex: Female     Is Non-Hispanic African American: Yes     Diabetic: Yes     Tobacco smoker: No     Systolic Blood Pressure: 0000000 mmHg     Is BP treated: No     HDL Cholesterol: 39 mg/dL     Total  Cholesterol: 181 mg/dL    A/P: Diabetes longstanding currently uncontrolled. Her A1c has been very high for quite some time. She goes prolonged times between provider visits, and she has not been 100% adherent to her medication program. Patient is able to verbalize appropriate hypoglycemia management plan. -Start metformin 750 mg XR daily. We will see if she can tolerate this.  -Continue TRulicity 1.5 mg weekly.  -Start Toujeo 10u daily.  -Encouraged pt to check home CBGs. Rx sent for One[employer] supplies downstairs. The Contour supplies are no longer covered.  -Extensively discussed pathophysiology of diabetes, recommended lifestyle interventions, dietary effects on blood sugar control -Counseled on s/sx of and management of hypoglycemia -Next A1C anticipated 06/2022.   Written patient instructions provided.  Total time in face to face counseling 30 minutes.   Follow up with me in 1 month.  Benard Halsted, PharmD, [facility] [place], [place] [phone]

## 2022-04-09 ENCOUNTER — Other Ambulatory Visit: Payer: Self-pay

## 2022-04-10 ENCOUNTER — Encounter: Payer: Self-pay | Admitting: Critical Care Medicine

## 2022-04-10 ENCOUNTER — Other Ambulatory Visit: Payer: Self-pay

## 2022-04-15 ENCOUNTER — Other Ambulatory Visit: Payer: Self-pay

## 2022-04-17 ENCOUNTER — Other Ambulatory Visit: Payer: Self-pay

## 2022-04-18 ENCOUNTER — Other Ambulatory Visit: Payer: Self-pay

## 2022-04-24 ENCOUNTER — Other Ambulatory Visit: Payer: Self-pay

## 2022-04-30 ENCOUNTER — Ambulatory Visit: Payer: BC Managed Care – PPO | Admitting: Critical Care Medicine

## 2022-04-30 NOTE — Progress Notes (Deleted)
Established Patient Office Visit  Subjective:  Patient ID: Krista White, female    DOB: Jun 30, 1980  Age: 42 y.o. MRN: [id number]  CC:  No chief complaint on file.   HPI 02/06/21 Krista White presents for work in visit for vaginal discharge and itching.  Patient has type 2 diabetes A1c we last saw her glucose was greater than 15.  On arrival A1c is still greater than 15 and blood sugar she states the vomiting been anywhere from 3-400.  She did not pursue the Trulicity as we prescribed because of COVID insurance cost.  She was taking the metformin 1000 mg twice daily.  She states her diet is such that she does not eat breakfast she eats a lot of processed foods for lunch a sandwich at dinnertime.  The patient is recovering from [place] for which she received pack Slo-Bid in mid December  Patient does complain of some hot flashes. 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  Her weight has gone up significantly as well.  She has not been seen in a considerable period of time and on arrival A1c greater than 15 blood sugar 473 blood pressure 144/96. She needs a urine microalbumin foot exam and Pap smear.  Patient is off all medications. 04/30/22 Foot pap Past Medical History:  Diagnosis Date   Anemia    COVID-19 virus infection 01/15/2021   Diabetes mellitus without complication ([place])    pt states she was pre diabetic but is no longer. Takes no meds and does not check her blood sugar.    Endometrial cancer [facility])    Endometrial cancer ([place]) 02/21/2015   Homelessness 05/24/2020   IBS (irritable bowel syndrome)    Nocturia    Obesity    Tingling    right hand   Vaginal yeast infection 02/06/2021    Past Surgical History:   Procedure Laterality Date   COLONOSCOPY WITH PROPOFOL N/A 02/14/2016   Procedure: COLONOSCOPY WITH PROPOFOL;  Surgeon: Milus Banister, MD;  Location: [facility];  Service: Endoscopy;  Laterality: N/A;   HYSTEROSCOPY WITH D & C N/A 01/30/2015   Procedure: DILATATION AND CURETTAGE /HYSTEROSCOPY;  Surgeon: Azucena Fallen, MD;  Location: [facility];  Service: Gynecology;  Laterality: N/A;   ROBOTIC ASSISTED TOTAL HYSTERECTOMY WITH BILATERAL SALPINGO OOPHERECTOMY N/A 03/22/2015   Procedure: XI ROBOTIC ASSISTED TOTAL ABDOMINAL HYSTERECTOMY FOR UTERUS GREATER THAN [address] WITH BILATERAL SALPINGO OOPHORECTOMY;  Surgeon: Everitt Amber, MD;  Location: [facility];  Service: Gynecology;  Laterality: N/A;   TYMPANOSTOMY TUBE PLACEMENT     bilat     Family History  Problem Relation Age of Onset   Ovarian cancer Mother    Diabetes Father    Heart attack Father    Throat cancer Paternal Uncle    Diabetes Paternal Aunt    Diabetes Maternal Uncle     Social History   Socioeconomic History   Marital status: Single    Spouse name: Not on file   Number of children: 0   Years of education: Not on file   Highest education level: Not on file  Occupational History    Employer: [employer]  Tobacco Use   Smoking status: Never   Smokeless tobacco: Never  Vaping Use   Vaping Use: Never used  Substance and Sexual Activity   Alcohol use: No    Alcohol/week: 0.0 standard drinks of alcohol   Drug use: No   Sexual activity: Not on file  Other Topics Concern   Not on file  Social History Narrative   Not on file   Social Determinants of Health   Financial Resource Strain: Not on file  Food Insecurity: Not on file  Transportation Needs: Not on file  Physical Activity: Not on file  Stress: Not on file  Social Connections: Not on file  Intimate Partner Violence: Not on file    Outpatient Medications Prior to Visit  Medication Sig Dispense Refill   Blood Glucose Monitoring Suppl (ONETOUCH VERIO) w/Device  KIT Use to check blood sugar three times daily. 1 kit 0   Dulaglutide (TRULICITY) 1.5 0000000 SOPN Inject 1.5 mg into the skin once a week. 2 mL 1   glucose blood (ONETOUCH VERIO) test strip Use to check blood sugar three times daily. 100 each 3   insulin glargine, 2 Unit Dial, (TOUJEO MAX) 300 UNIT/ML Solostar Pen Inject 10 Units into the skin daily. 15 mL 2   Insulin Pen Needle 31G X 5 MM MISC Use with insulin pen 100 each 2   metFORMIN (GLUCOPHAGE-XR) 750 MG 24 hr tablet Take 1 tablet (750 mg total) by mouth daily with breakfast. 90 tablet 2   OneTouch Delica Lancets 99991111 MISC Use to check blood sugar three times daily. 100 each 3   valsartan (DIOVAN) 80 MG tablet Take 1 tablet (80 mg total) by mouth daily. 90 tablet 3   No facility-administered medications prior to visit.    Allergies  Allergen Reactions   Asa [Aspirin] Hives   Latex Itching   Pineapple     Mouth itches   Tilactase Diarrhea    ROS Review of Systems  Constitutional:  Negative for chills, diaphoresis and fever.  HENT:  Negative for congestion, hearing loss, nosebleeds, sore throat and tinnitus.   Eyes:  Negative for photophobia, redness and visual disturbance.  Respiratory:  Negative for cough, shortness of breath, wheezing and stridor.   Cardiovascular:  Negative for chest pain, palpitations and leg swelling.  Gastrointestinal:  Negative for abdominal pain, blood in stool, constipation, diarrhea, nausea and vomiting.  Endocrine: Negative for polydipsia, polyphagia and polyuria.  Genitourinary:  Negative for decreased urine volume, dysuria, flank pain, frequency, hematuria, pelvic pain, urgency, vaginal bleeding, vaginal discharge and vaginal pain.  Musculoskeletal:  Negative for back pain, myalgias and neck pain.  Skin:  Positive for wound. Negative for rash.  Allergic/Immunologic: Negative for environmental allergies.  Neurological:  Negative for dizziness, tremors, seizures, weakness, numbness and headaches.   Hematological:  Does not bruise/bleed easily.  Psychiatric/Behavioral:  Negative for dysphoric mood, self-injury, sleep disturbance and suicidal ideas. The patient is not nervous/anxious.       Objective:    Physical Exam Vitals reviewed.  Constitutional:      Appearance: Normal appearance. She is well-developed. She is obese. She is not diaphoretic.  HENT:     Head: Normocephalic and atraumatic.     Nose: No nasal deformity, septal deviation, mucosal edema or rhinorrhea.     Right Sinus: No maxillary sinus tenderness or frontal sinus tenderness.     Left Sinus: No maxillary sinus tenderness or frontal sinus tenderness.     Mouth/Throat:     Mouth: Mucous membranes are moist.     Pharynx: Oropharynx is clear. No  oropharyngeal exudate.  Eyes:     General: No scleral icterus.    Conjunctiva/sclera: Conjunctivae normal.     Pupils: Pupils are equal, round, and reactive to light.  Neck:     Thyroid: No thyromegaly.     Vascular: No carotid bruit or JVD.     Trachea: Trachea normal. No tracheal tenderness or tracheal deviation.  Cardiovascular:     Rate and Rhythm: Normal rate and regular rhythm.     Chest Wall: PMI is not displaced.     Pulses: Normal pulses. No decreased pulses.     Heart sounds: Normal heart sounds, S1 normal and S2 normal. Heart sounds not distant. No murmur heard.    No systolic murmur is present.     No diastolic murmur is present.     No friction rub. No gallop. No S3 or S4 sounds.  Pulmonary:     Effort: Pulmonary effort is normal. No tachypnea, accessory muscle usage or respiratory distress.     Breath sounds: Normal breath sounds. No stridor. No decreased breath sounds, wheezing, rhonchi or rales.  Chest:     Chest wall: No tenderness.  Abdominal:     General: Bowel sounds are normal. There is no distension.     Palpations: Abdomen is soft. Abdomen is not rigid.     Tenderness: There is no abdominal tenderness. There is no guarding or rebound.   Genitourinary:    Vagina: No vaginal discharge.     Comments: Perineal area is severely involved with candidiasis also there is vaginal swelling as well whitish discharge Musculoskeletal:        General: Normal range of motion.     Cervical back: Normal range of motion and neck supple. No edema, erythema or rigidity. No muscular tenderness. Normal range of motion.  Lymphadenopathy:     Head:     Right side of head: No submental or submandibular adenopathy.     Left side of head: No submental or submandibular adenopathy.     Cervical: No cervical adenopathy.  Skin:    General: Skin is warm and dry.     Coloration: Skin is not pale.     Findings: Lesion present. No rash.     Nails: There is no clubbing.     Comments: Wound over lower back draining  Neurological:     General: No focal deficit present.     Mental Status: She is alert and oriented to person, place, and time. Mental status is at baseline.     Sensory: No sensory deficit.  Psychiatric:        Mood and Affect: Mood normal.        Speech: Speech normal.        Behavior: Behavior normal.        Thought Content: Thought content normal.        Judgment: Judgment normal.     LMP 03/15/2015  Wt Readings from Last 3 Encounters:  03/11/22 (!) 380 lb 12.8 oz (172.7 kg)  02/06/21 (!) 382 lb 6.4 oz (173.5 kg)  05/24/20 (!) 372 lb (168.7 kg)     Health Maintenance Due  Topic Date Due   OPHTHALMOLOGY EXAM  Never done   COVID-19 Vaccine (3 - Pfizer risk series) 03/07/2020   FOOT EXAM  05/24/2021   PAP SMEAR-Modifier  08/04/2021   INFLUENZA VACCINE  09/03/2021   DTaP/Tdap/Td (3 - Td or Tdap) 10/12/2021    There are no preventive care reminders to display for this  patient.  Lab Results  Component Value Date   TSH 2.03 11/24/2017   Lab Results  Component Value Date   WBC 10.8 03/11/2022   HGB 14.1 03/11/2022   HCT 42.1 03/11/2022   MCV 84 03/11/2022   PLT 484 (H) 03/11/2022   Lab Results  Component Value Date    NA 136 03/11/2022   K 4.5 03/11/2022   CO2 24 03/11/2022   GLUCOSE 425 (H) 03/11/2022   BUN 15 03/11/2022   CREATININE 0.86 03/11/2022   BILITOT <0.2 03/11/2022   ALKPHOS 140 (H) 03/11/2022   AST 12 03/11/2022   ALT 18 03/11/2022   PROT 8.0 03/11/2022   ALBUMIN 4.5 03/11/2022   CALCIUM 9.9 03/11/2022   ANIONGAP 8 03/23/2015   EGFR 86 03/11/2022   GFR 114.30 11/24/2017   Lab Results  Component Value Date   CHOL 181 06/14/2020   Lab Results  Component Value Date   HDL 39 (L) 06/14/2020   Lab Results  Component Value Date   LDLCALC 117 (H) 06/14/2020   Lab Results  Component Value Date   TRIG 141 06/14/2020   Lab Results  Component Value Date   CHOLHDL 4.6 (H) 06/14/2020   Lab Results  Component Value Date   HGBA1C >15 03/11/2022      Assessment & Plan:   Problem List Items Addressed This Visit   None No orders of the defined types were placed in this encounter.   Follow-up: No follow-ups on file.    Asencion Noble, MD

## 2022-05-08 ENCOUNTER — Other Ambulatory Visit: Payer: Self-pay

## 2022-05-27 ENCOUNTER — Other Ambulatory Visit: Payer: Self-pay

## 2022-06-02 ENCOUNTER — Other Ambulatory Visit: Payer: Self-pay

## 2022-06-04 ENCOUNTER — Other Ambulatory Visit: Payer: Self-pay

## 2022-06-05 ENCOUNTER — Other Ambulatory Visit: Payer: Self-pay

## 2022-06-19 ENCOUNTER — Encounter: Payer: Self-pay | Admitting: Critical Care Medicine

## 2022-06-19 ENCOUNTER — Other Ambulatory Visit: Payer: Self-pay

## 2022-06-19 DIAGNOSIS — E1165 Type 2 diabetes mellitus with hyperglycemia: Secondary | ICD-10-CM

## 2022-06-23 ENCOUNTER — Other Ambulatory Visit: Payer: Self-pay

## 2022-06-24 ENCOUNTER — Other Ambulatory Visit: Payer: Self-pay

## 2022-06-24 MED ORDER — INSULIN GLARGINE (2 UNIT DIAL) 300 UNIT/ML ~~LOC~~ SOPN
20.0000 [IU] | PEN_INJECTOR | Freq: Every day | SUBCUTANEOUS | 2 refills | Status: DC
  Filled 2022-06-24: qty 6, 90d supply, fill #0
  Filled 2022-09-03: qty 3, 45d supply, fill #0
  Filled 2022-10-29: qty 6, 90d supply, fill #0

## 2022-06-26 ENCOUNTER — Other Ambulatory Visit: Payer: Self-pay

## 2022-06-27 ENCOUNTER — Other Ambulatory Visit: Payer: Self-pay

## 2022-09-03 ENCOUNTER — Other Ambulatory Visit: Payer: Self-pay

## 2022-09-04 ENCOUNTER — Other Ambulatory Visit: Payer: Self-pay

## 2022-10-29 ENCOUNTER — Other Ambulatory Visit: Payer: Self-pay

## 2022-11-06 ENCOUNTER — Other Ambulatory Visit: Payer: Self-pay

## 2022-11-06 ENCOUNTER — Other Ambulatory Visit: Payer: Self-pay | Admitting: Pharmacist

## 2022-11-06 MED ORDER — INSULIN PEN NEEDLE 31G X 5 MM MISC
0 refills | Status: DC
  Filled 2022-11-06: qty 100, 90d supply, fill #0

## 2022-11-07 ENCOUNTER — Other Ambulatory Visit: Payer: Self-pay

## 2022-11-18 ENCOUNTER — Other Ambulatory Visit: Payer: Self-pay

## 2022-11-25 ENCOUNTER — Ambulatory Visit: Payer: BC Managed Care – PPO | Admitting: Internal Medicine

## 2023-02-10 ENCOUNTER — Emergency Department (HOSPITAL_COMMUNITY)
Admission: EM | Admit: 2023-02-10 | Discharge: 2023-02-10 | Disposition: A | Discharge status: Home / Self Care | Payer: BC Managed Care – PPO | Attending: Emergency Medicine | Admitting: Emergency Medicine

## 2023-02-10 ENCOUNTER — Emergency Department (HOSPITAL_COMMUNITY): Payer: BC Managed Care – PPO

## 2023-02-10 ENCOUNTER — Other Ambulatory Visit: Payer: Self-pay

## 2023-02-10 ENCOUNTER — Telehealth: Payer: BC Managed Care – PPO

## 2023-02-10 ENCOUNTER — Encounter (HOSPITAL_COMMUNITY): Payer: Self-pay

## 2023-02-10 ENCOUNTER — Ambulatory Visit (HOSPITAL_COMMUNITY)
Admission: EM | Admit: 2023-02-10 | Discharge: 2023-02-10 | Disposition: A | Discharge status: Home / Self Care | Payer: BC Managed Care – PPO | Attending: Emergency Medicine | Admitting: Emergency Medicine

## 2023-02-10 DIAGNOSIS — R051 Acute cough: Secondary | ICD-10-CM

## 2023-02-10 DIAGNOSIS — E1165 Type 2 diabetes mellitus with hyperglycemia: Secondary | ICD-10-CM | POA: Insufficient documentation

## 2023-02-10 DIAGNOSIS — E669 Obesity, unspecified: Secondary | ICD-10-CM | POA: Diagnosis not present

## 2023-02-10 DIAGNOSIS — E860 Dehydration: Secondary | ICD-10-CM | POA: Diagnosis not present

## 2023-02-10 DIAGNOSIS — R739 Hyperglycemia, unspecified: Secondary | ICD-10-CM

## 2023-02-10 DIAGNOSIS — Z91040 Latex allergy status: Secondary | ICD-10-CM | POA: Insufficient documentation

## 2023-02-10 DIAGNOSIS — U071 COVID-19: Secondary | ICD-10-CM | POA: Insufficient documentation

## 2023-02-10 DIAGNOSIS — Z6841 Body Mass Index (BMI) 40.0 and over, adult: Secondary | ICD-10-CM | POA: Diagnosis not present

## 2023-02-10 DIAGNOSIS — R059 Cough, unspecified: Secondary | ICD-10-CM | POA: Diagnosis not present

## 2023-02-10 DIAGNOSIS — Z794 Long term (current) use of insulin: Secondary | ICD-10-CM | POA: Diagnosis not present

## 2023-02-10 LAB — I-STAT VENOUS BLOOD GAS, ED
Acid-Base Excess: 4 mmol/L — ABNORMAL HIGH (ref 0.0–2.0)
Bicarbonate: 29 mmol/L — ABNORMAL HIGH (ref 20.0–28.0)
Calcium, Ion: 1.2 mmol/L (ref 1.15–1.40)
HCT: 43 % (ref 36.0–46.0)
Hemoglobin: 14.6 g/dL (ref 12.0–15.0)
O2 Saturation: 63 %
Potassium: 4.7 mmol/L (ref 3.5–5.1)
Sodium: 135 mmol/L (ref 135–145)
TCO2: 30 mmol/L (ref 22–32)
pCO2, Ven: 44.7 mm[Hg] (ref 44–60)
pH, Ven: 7.421 (ref 7.25–7.43)
pO2, Ven: 32 mm[Hg] (ref 32–45)

## 2023-02-10 LAB — CBG MONITORING, ED
Glucose-Capillary: 356 mg/dL — ABNORMAL HIGH (ref 70–99)
Glucose-Capillary: 294 mg/dL — ABNORMAL HIGH (ref 70–99)

## 2023-02-10 LAB — I-STAT CHEM 8, ED
BUN: 12 mg/dL (ref 6–20)
Calcium, Ion: 1.15 mmol/L (ref 1.15–1.40)
Chloride: 100 mmol/L (ref 98–111)
Creatinine, Ser: 0.6 mg/dL (ref 0.44–1.00)
Glucose, Bld: 403 mg/dL — ABNORMAL HIGH (ref 70–99)
HCT: 44 % (ref 36.0–46.0)
Hemoglobin: 15 g/dL (ref 12.0–15.0)
Potassium: 4.7 mmol/L (ref 3.5–5.1)
Sodium: 136 mmol/L (ref 135–145)
TCO2: 27 mmol/L (ref 22–32)

## 2023-02-10 LAB — COMPREHENSIVE METABOLIC PANEL
ALT: 17 U/L (ref 0–44)
AST: 17 U/L (ref 15–41)
Albumin: 3.7 g/dL (ref 3.5–5.0)
Alkaline Phosphatase: 116 U/L (ref 38–126)
Anion gap: 11 (ref 5–15)
BUN: 10 mg/dL (ref 6–20)
CO2: 25 mmol/L (ref 22–32)
Calcium: 9.7 mg/dL (ref 8.9–10.3)
Chloride: 98 mmol/L (ref 98–111)
Creatinine, Ser: 0.67 mg/dL (ref 0.44–1.00)
GFR, Estimated: >60 mL/min (ref >60)
Glucose, Bld: 403 mg/dL — ABNORMAL HIGH (ref 70–99)
Potassium: 4.5 mmol/L (ref 3.5–5.1)
Sodium: 134 mmol/L — ABNORMAL LOW (ref 135–145)
Total Bilirubin: 0.5 mg/dL (ref 0.0–1.2)
Total Protein: 8.1 g/dL (ref 6.5–8.1)

## 2023-02-10 LAB — POCT FASTING CBG KUC MANUAL ENTRY: POCT Glucose (KUC): 403 mg/dL — AB (ref 70–99)

## 2023-02-10 LAB — CBC
HCT: 42.9 % (ref 36.0–46.0)
Hemoglobin: 14.4 g/dL (ref 12.0–15.0)
MCH: 27.4 pg (ref 26.0–34.0)
MCHC: 33.6 g/dL (ref 30.0–36.0)
MCV: 81.7 fL (ref 80.0–100.0)
Platelets: 395 10*3/uL (ref 150–400)
RBC: 5.25 MIL/uL — ABNORMAL HIGH (ref 3.87–5.11)
RDW: 11.9 % (ref 11.5–15.5)
WBC: 8.8 10*3/uL (ref 4.0–10.5)
nRBC: 0 % (ref 0.0–0.2)

## 2023-02-10 LAB — URINALYSIS, ROUTINE W REFLEX MICROSCOPIC
Bilirubin Urine: NEGATIVE
Glucose, UA: >=500 mg/dL — AB
Hgb urine dipstick: NEGATIVE
Ketones, ur: NEGATIVE mg/dL
Leukocytes,Ua: NEGATIVE
Nitrite: NEGATIVE
Protein, ur: NEGATIVE mg/dL
Specific Gravity, Urine: 1.028 (ref 1.005–1.030)
pH: 5 (ref 5.0–8.0)

## 2023-02-10 MED ORDER — SODIUM CHLORIDE 0.9 % IV BOLUS
1000.0000 mL | Freq: Once | INTRAVENOUS | Status: AC
  Administered 2023-02-10: 1000 mL via INTRAVENOUS

## 2023-02-10 MED ORDER — INSULIN ASPART 100 UNIT/ML IJ SOLN
10.0000 [IU] | Freq: Once | INTRAMUSCULAR | Status: AC
  Administered 2023-02-10: 10 [IU] via INTRAVENOUS

## 2023-02-10 NOTE — ED Triage Notes (Signed)
 PT complains of cough, headache since New Years Eve. Testing positive for COVID 4 days ago. Pt went to urgent care and was told to come to ER for evaluation of Blood pressure and glucose.

## 2023-02-10 NOTE — Discharge Instructions (Signed)
 As discussed, your evaluation today has been largely reassuring.  But, it is important that you monitor your condition carefully, and do not hesitate to return to the ED if you develop new, or concerning changes in your condition. ? ?Otherwise, please follow-up with your physician for appropriate ongoing care. ? ?

## 2023-02-10 NOTE — ED Provider Triage Note (Signed)
 Emergency Medicine Provider Triage Evaluation Note  Krista White , a 43 y.o. female  was evaluated in triage.  Pt complains of cough nausea weakness.  Patient with history of hyperglycemia, and COVID diagnosis earlier this week.  She has been symptomatic for about 1 week, progressively worse.  She has been taking her medication..  Review of Systems  Positive: Nausea, vomiting Negative: Chest pain  Physical Exam  BP (!) 154/106 (BP Location: Right Arm)   Pulse (!) 125   Temp 98.8 F (37.1 C)   Resp (!) 24   Ht 5' 4 (1.626 m)   Wt (!) 172.7 kg   LMP 03/15/2015   SpO2 99%   BMI 65.35 kg/m  Gen:   Awake, no distress beats adult female awake and alert Resp:  Normal effort no increased work of breathing MSK:   Moves extremities without difficulty no deformity Other:  Neuro unremarkable  Medical Decision Making  Medically screening exam initiated at 12:35 PM.  Appropriate orders placed.  Krista White was informed that the remainder of the evaluation will be completed by another provider, this initial triage assessment does not replace that evaluation, and the importance of remaining in the ED until their evaluation is complete.   Krista Charleston, MD 02/10/23 1236

## 2023-02-10 NOTE — ED Triage Notes (Addendum)
 Patient here today with c/o cough, runny nose, right side of back pain, nausea, headache, SOB. Symptoms started on New Year's Eve. She has been taking Aleve Dual Action and IBU with no relief. Denies fever. Some coworkers have also been sick.

## 2023-02-10 NOTE — ED Provider Notes (Signed)
 [place] EMERGENCY DEPARTMENT AT [facility] Provider Note   CSN: [id number] Arrival date & time: 02/10/23  1147     History  Chief Complaint  Patient presents with   Cough   Hyperglycemia    Krista White is a 43 y.o. female.  HPI Presents after initially going to urgent care.  History is obtained by the patient, and urgent care notes.  Has a history of diabetes, COVID diagnosed last week and over the past week has had URI-like illness.  Today she presents with generally worsening condition including cough, congestion, fatigue, as well as new hyperglycemia.  No vomiting, no fever, no confusion.    Home Medications Prior to Admission medications   Medication Sig Start Date End Date Taking? Authorizing Provider  Blood Glucose Monitoring Suppl (ONETOUCH VERIO) w/Device KIT Use to check blood sugar three times daily. 04/08/22   Brien Belvie BRAVO, MD  glucose blood (ONETOUCH VERIO) test strip Use to check blood sugar three times daily. 04/08/22   Brien Belvie BRAVO, MD  insulin  glargine, 2 Unit Dial , (TOUJEO  MAX) 300 UNIT/ML Solostar Pen Inject 20 Units into the skin daily. 06/24/22   Brien Belvie BRAVO, MD  Insulin  Pen Needle 31G X 5 MM MISC Use with insulin  pen 11/06/22   Newlin, Enobong, MD  metFORMIN  (GLUCOPHAGE -XR) 750 MG 24 hr tablet Take 1 tablet (750 mg total) by mouth daily with breakfast. 04/08/22   Brien Belvie BRAVO, MD  OneTouch Delica Lancets 33G MISC Use to check blood sugar three times daily. 04/08/22   Brien Belvie BRAVO, MD  valsartan  (DIOVAN ) 80 MG tablet Take 1 tablet (80 mg total) by mouth daily. Patient not taking: Reported on 02/10/2023 03/11/22   Brien Belvie BRAVO, MD      Allergies    Asa [aspirin], Latex, Pineapple, and Tilactase    Review of Systems   Review of Systems  Physical Exam Updated Vital Signs BP (!) 144/86 (BP Location: Right Wrist)   Pulse 98   Temp 97.7 F (36.5 C) (Oral)   Resp 15   Ht 5' 4 (1.626 m)   Wt (!) 172.7 kg   LMP 03/15/2015    SpO2 99%   BMI 65.35 kg/m  Physical Exam Vitals and nursing note reviewed.  Constitutional:      Appearance: She is well-developed. She is obese.  HENT:     Head: Normocephalic and atraumatic.  Eyes:     Conjunctiva/sclera: Conjunctivae normal.  Cardiovascular:     Rate and Rhythm: Regular rhythm. Tachycardia present.  Pulmonary:     Effort: Pulmonary effort is normal. No respiratory distress.     Breath sounds: Normal breath sounds. No stridor.  Abdominal:     General: There is no distension.  Skin:    General: Skin is warm and dry.  Neurological:     Mental Status: She is alert and oriented to person, place, and time.     Cranial Nerves: No cranial nerve deficit.  Psychiatric:        Mood and Affect: Mood normal.     ED Results / Procedures / Treatments   Labs (all labs ordered are listed, but only abnormal results are displayed) Labs Reviewed  CBC - Abnormal; Notable for the following components:      Result Value   RBC 5.25 (*)    All other components within normal limits  URINALYSIS, ROUTINE W REFLEX MICROSCOPIC - Abnormal; Notable for the following components:   Glucose, UA >=500 (*)  Bacteria, UA RARE (*)    All other components within normal limits  COMPREHENSIVE METABOLIC PANEL - Abnormal; Notable for the following components:   Sodium 134 (*)    Glucose, Bld 403 (*)    All other components within normal limits  CBG MONITORING, ED - Abnormal; Notable for the following components:   Glucose-Capillary 356 (*)    All other components within normal limits  I-STAT VENOUS BLOOD GAS, ED - Abnormal; Notable for the following components:   Bicarbonate 29.0 (*)    Acid-Base Excess 4.0 (*)    All other components within normal limits  I-STAT CHEM 8, ED - Abnormal; Notable for the following components:   Glucose, Bld 403 (*)    All other components within normal limits  CBG MONITORING, ED - Abnormal; Notable for the following components:   Glucose-Capillary  294 (*)    All other components within normal limits    EKG None  Radiology DG Chest 2 View Result Date: 02/10/2023 CLINICAL DATA:  Cough.  Rule out pneumonia. EXAM: CHEST - 2 VIEW COMPARISON:  03/15/2015. FINDINGS: Bilateral lung fields are clear. Bilateral costophrenic angles are clear. Normal cardio-mediastinal silhouette. No acute osseous abnormalities. The soft tissues are within normal limits. IMPRESSION: No active cardiopulmonary disease. Electronically Signed   By: Ree Molt M.D.   On: 02/10/2023 13:42    Procedures Procedures    Medications Ordered in ED Medications  sodium chloride  0.9 % bolus 1,000 mL (has no administration in time range)  insulin  aspart (novoLOG ) injection 10 Units (has no administration in time range)  sodium chloride  0.9 % bolus 1,000 mL (1,000 mLs Intravenous New Bag/Given 02/10/23 1237)    ED Course/ Medical Decision Making/ A&P                                 Medical Decision Making Adult female with insulin -dependent diabetes presents with cough, congestion, in the context of recent COVID diagnosis, but now with complication of new hyperglycemia.  Concern for multifactorial illness, including DKA, nonketotic hyperosmolar state, bacteremia, sepsis.  Patient received fluids, monitoring. Cardiac 105 sinus tach abnormal Improved to less than 90 sinus normal  Pulse ox 100% room air normal   Amount and/or Complexity of Data Reviewed External Data Reviewed: notes.    Details: Urgent care notes reviewed Labs: ordered. Decision-making details documented in ED Course. Radiology: ordered and independent interpretation performed. Decision-making details documented in ED Course.  Risk Prescription drug management. Decision regarding hospitalization. Diagnosis or treatment significantly limited by social determinants of health.   Glucose has diminished since arrival with initial fluid resuscitation, she continues to be fluids, insulin .  No  evidence of bacteremia, sepsis, no evidence for DKA, nonketotic hyperosmolar state.  Suspicion for hypermetabolic state with tachycardia, mild hyperglycemia, that improved as above.  Patient has no new oxygen requirement, no evidence for bacteremia, sepsis as above, and with improvement here following fluid resuscitation patient will be appropriate for close outpatient follow-up.        Final Clinical Impression(s) / ED Diagnoses Final diagnoses:  COVID  Hyperglycemia  Dehydration     Garrick Charleston, MD 02/10/23 [phone]

## 2023-02-10 NOTE — ED Notes (Signed)
 Patient is being discharged from the Urgent Care and sent to the Emergency Department via self . Per provider, patient is in need of higher level of care due to hyperglycemia. Patient is aware and verbalizes understanding of plan of care.  Vitals:   02/10/23 1014  BP: (!) 151/88  Pulse: (!) 110  Resp: 16  Temp: 98.7 F (37.1 C)  SpO2: 100%

## 2023-02-10 NOTE — ED Provider Notes (Signed)
 [facility]    CSN: [id number] Arrival date & time: 02/10/23  9041      History   Chief Complaint Chief Complaint  Patient presents with   Cough    HPI Krista White is a 43 y.o. female.   43 year old female, Krista White, presents to urgent care for cough,runny nose, nausea,headache, short of breath, wheezing x 1 week.  Patient states her symptoms are worse at rest/laying down, she also reports a sharp pain in the right side of her head and right sided back pain . Patient states she has been taking Aleve dual action ibuprofen  with no relief.  Patient denies fever, +recent exposure to coworker with similar illness.  Patient states she does not take Trulicity  due to cost, nor does she check her blood sugar daily,patient does not take her blood pressure medicine daily as her doctor prescribed it when I was under a lot of stress, and patient does not believe she has blood pressure issues . patient is aware her blood pressure was elevated at today's visit.  Discussed with patient that we will check her blood sugar at today's visit.  The history is provided by the patient. No language interpreter was used.    Past Medical History:  Diagnosis Date   Anemia    COVID-19 virus infection 01/15/2021   Diabetes mellitus without complication (HCC)    pt states she was pre diabetic but is no longer. Takes no meds and does not check her blood sugar.    Endometrial cancer (HCC)    Endometrial cancer (HCC) 02/21/2015   Homelessness 05/24/2020   IBS (irritable bowel syndrome)    Nocturia    Obesity    Tingling    right hand   Vaginal yeast infection 02/06/2021    Patient Active Problem List   Diagnosis Date Noted   Acute cough 02/10/2023   Hyperglycemia 02/10/2023   Open wound 03/11/2022   Perimenopausal 02/06/2021   Uncontrolled type 2 diabetes mellitus with hyperglycemia, with long-term current use of insulin  (HCC) 05/24/2020   Morbid obesity with BMI of 70 and over,  adult (HCC) 02/21/2015    Past Surgical History:  Procedure Laterality Date   COLONOSCOPY WITH PROPOFOL  N/A 02/14/2016   Procedure: COLONOSCOPY WITH PROPOFOL ;  Surgeon: Toribio SHAUNNA Cedar, MD;  Location: [facility];  Service: Endoscopy;  Laterality: N/A;   HYSTEROSCOPY WITH D & C N/A 01/30/2015   Procedure: DILATATION AND CURETTAGE /HYSTEROSCOPY;  Surgeon: Robbi Render, MD;  Location: [facility];  Service: Gynecology;  Laterality: N/A;   ROBOTIC ASSISTED TOTAL HYSTERECTOMY WITH BILATERAL SALPINGO OOPHERECTOMY N/A 03/22/2015   Procedure: XI ROBOTIC ASSISTED TOTAL ABDOMINAL HYSTERECTOMY FOR UTERUS GREATER THAN 250 GMS WITH BILATERAL SALPINGO OOPHORECTOMY;  Surgeon: Maurilio Ship, MD;  Location: [facility];  Service: Gynecology;  Laterality: N/A;   TYMPANOSTOMY TUBE PLACEMENT     bilat     OB History   No obstetric history on file.      Home Medications    Prior to Admission medications   Medication Sig Start Date End Date Taking? Authorizing Provider  Blood Glucose Monitoring Suppl (ONETOUCH VERIO) w/Device KIT Use to check blood sugar three times daily. 04/08/22   Brien Belvie BRAVO, MD  glucose blood (ONETOUCH VERIO) test strip Use to check blood sugar three times daily. 04/08/22   Brien Belvie BRAVO, MD  insulin  glargine, 2 Unit Dial , (TOUJEO  MAX) 300 UNIT/ML Solostar Pen Inject 20 Units into the skin daily. 06/24/22   Brien,  Belvie BRAVO, MD  Insulin  Pen Needle 31G X 5 MM MISC Use with insulin  pen 11/06/22   Newlin, Enobong, MD  metFORMIN  (GLUCOPHAGE -XR) 750 MG 24 hr tablet Take 1 tablet (750 mg total) by mouth daily with breakfast. 04/08/22   Brien Belvie BRAVO, MD  OneTouch Delica Lancets 33G MISC Use to check blood sugar three times daily. 04/08/22   Brien Belvie BRAVO, MD  valsartan  (DIOVAN ) 80 MG tablet Take 1 tablet (80 mg total) by mouth daily. Patient not taking: Reported on 02/10/2023 03/11/22   Brien Belvie BRAVO, MD    Family History Family History  Problem Relation Age of Onset   Ovarian cancer  Mother    Diabetes Father    Heart attack Father    Throat cancer Paternal Uncle    Diabetes Paternal Aunt    Diabetes Maternal Uncle     Social History Social History   Tobacco Use   Smoking status: Never   Smokeless tobacco: Never  Vaping Use   Vaping status: Never Used  Substance Use Topics   Alcohol use: No    Alcohol/week: 0.0 standard drinks of alcohol   Drug use: No     Allergies   Asa [aspirin], Latex, Pineapple, and Tilactase   Review of Systems Review of Systems  Constitutional:  Negative for fever.  HENT:  Positive for congestion.   Respiratory:  Positive for cough and shortness of breath.   Gastrointestinal:  Positive for nausea.  All other systems reviewed and are negative.    Physical Exam Triage Vital Signs ED Triage Vitals  Encounter Vitals Group     BP 02/10/23 1014 (!) 151/88     Systolic BP Percentile --      Diastolic BP Percentile --      Pulse Rate 02/10/23 1014 (!) 110     Resp 02/10/23 1014 16     Temp 02/10/23 1014 98.7 F (37.1 C)     Temp Source 02/10/23 1014 Oral     SpO2 02/10/23 1014 100 %     Weight --      Height 02/10/23 1015 5' 4 (1.626 m)     Head Circumference --      Peak Flow --      Pain Score 02/10/23 1016 7     Pain Loc --      Pain Education --      Exclude from Growth Chart --    No data found.  Updated Vital Signs BP (!) 151/88 (BP Location: Right Arm)   Pulse (!) 110   Temp 98.7 F (37.1 C) (Oral)   Resp 16   Ht 5' 4 (1.626 m)   LMP 03/15/2015   SpO2 100%   BMI 65.36 kg/m   Visual Acuity Right Eye Distance:   Left Eye Distance:   Bilateral Distance:    Right Eye Near:   Left Eye Near:    Bilateral Near:     Physical Exam Vitals and nursing note reviewed.  Constitutional:      General: She is not in acute distress.    Appearance: She is well-developed. She is morbidly obese.     Comments: BMI 65.2  HENT:     Head: Normocephalic.     Right Ear: Tympanic membrane is retracted.      Left Ear: Tympanic membrane is retracted.     Nose: Mucosal edema and congestion present.     Mouth/Throat:     Lips: Pink.     Mouth:  Mucous membranes are moist.     Pharynx: Uvula midline. Postnasal drip present.  Eyes:     General: Lids are normal.     Conjunctiva/sclera: Conjunctivae normal.     Pupils: Pupils are equal, round, and reactive to light.  Neck:     Trachea: No tracheal deviation.  Cardiovascular:     Rate and Rhythm: Regular rhythm. Tachycardia present.     Pulses: Normal pulses.     Heart sounds: Normal heart sounds. No murmur heard. Pulmonary:     Effort: Pulmonary effort is normal.     Breath sounds: Normal breath sounds and air entry.     Comments: 02 sat 100% on RA Abdominal:     General: Bowel sounds are normal.     Palpations: Abdomen is soft.     Tenderness: There is no abdominal tenderness.  Musculoskeletal:        General: Normal range of motion.     Cervical back: Normal range of motion.  Lymphadenopathy:     Cervical: No cervical adenopathy.  Skin:    General: Skin is warm and dry.     Findings: No rash.  Neurological:     General: No focal deficit present.     Mental Status: She is alert and oriented to person, place, and time.     GCS: GCS eye subscore is 4. GCS verbal subscore is 5. GCS motor subscore is 6.  Psychiatric:        Speech: Speech normal.        Behavior: Behavior is agitated.     Comments: Matt,CMA, in with provider during discussion regarding cc and plan of care, pt verbalized understanding to this provider.      UC Treatments / Results  Labs (all labs ordered are listed, but only abnormal results are displayed) Labs Reviewed  POCT FASTING CBG [facility] MANUAL ENTRY - Abnormal; Notable for the following components:      Result Value   POCT Glucose ([facility]) 403 (*)    All other components within normal limits    EKG   Radiology No results found.  Procedures Procedures (including critical care time)  Medications Ordered  in UC Medications - No data to display  Initial Impression / Assessment and Plan / UC Course  I have reviewed the triage vital signs and the nursing notes.  Pertinent labs & imaging results that were available during my care of the patient were reviewed by me and considered in my medical decision making (see chart for details).  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  Discussed with patient that due to worsening symptoms will treat with antibiotic for upper respiratory infection , patient states she does not have this, blood sugar check is 403 in office, patients heart rate is 110, will refer to emergency room for further evaluation.  [JD]    Clinical Course User Index [JD] Chelcee Korpi, Rilla, NP    Ddx: Hyperglycemia, Acute respiratory infection, cough Final Clinical Impressions(s) / UC Diagnoses   Final diagnoses:  Acute cough  Hyperglycemia     Discharge Instructions      Please go to emergency room for further evaluation and management of symptoms  ED Prescriptions   None    PDMP not reviewed this encounter.   Aminta Loose, NP 02/10/23 1141

## 2023-02-10 NOTE — Discharge Instructions (Signed)
 Please go to emergency room for further evaluation and management of symptoms

## 2023-02-11 ENCOUNTER — Encounter: Payer: Self-pay | Admitting: Critical Care Medicine

## 2023-04-28 ENCOUNTER — Telehealth: Admitting: Physician Assistant

## 2023-04-28 DIAGNOSIS — H6992 Unspecified Eustachian tube disorder, left ear: Secondary | ICD-10-CM

## 2023-04-29 ENCOUNTER — Encounter: Payer: Self-pay | Admitting: Critical Care Medicine

## 2023-04-29 MED ORDER — AZITHROMYCIN 250 MG PO TABS: ORAL_TABLET | ORAL | 0 refills | Status: AC

## 2023-04-29 MED ORDER — FLUTICASONE PROPIONATE 50 MCG/ACT NA SUSP: 2.0000 | Freq: Every day | NASAL | 0 refills | Status: DC

## 2023-04-29 NOTE — Addendum Note (Signed)
 Addended by: Waldon Merl on: 04/29/2023 01:19 PM   Modules accepted: Orders

## 2023-04-29 NOTE — Progress Notes (Signed)

## 2023-05-06 ENCOUNTER — Encounter: Payer: Self-pay | Admitting: Critical Care Medicine

## 2023-05-13 ENCOUNTER — Ambulatory Visit: Attending: Physician Assistant | Admitting: Physician Assistant

## 2023-05-13 VITALS — BP 123/75 | HR 82 | Temp 98.0°F | Ht 64.0 in | Wt >= 6400 oz

## 2023-05-13 DIAGNOSIS — E1165 Type 2 diabetes mellitus with hyperglycemia: Secondary | ICD-10-CM

## 2023-05-13 DIAGNOSIS — Z794 Long term (current) use of insulin: Secondary | ICD-10-CM | POA: Diagnosis not present

## 2023-05-13 DIAGNOSIS — R609 Edema, unspecified: Secondary | ICD-10-CM

## 2023-05-13 DIAGNOSIS — Z91199 Patient's noncompliance with other medical treatment and regimen due to unspecified reason: Secondary | ICD-10-CM

## 2023-05-13 DIAGNOSIS — J9801 Acute bronchospasm: Secondary | ICD-10-CM

## 2023-05-13 DIAGNOSIS — H539 Unspecified visual disturbance: Secondary | ICD-10-CM | POA: Diagnosis not present

## 2023-05-13 DIAGNOSIS — H6992 Unspecified Eustachian tube disorder, left ear: Secondary | ICD-10-CM | POA: Diagnosis not present

## 2023-05-13 DIAGNOSIS — J302 Other seasonal allergic rhinitis: Secondary | ICD-10-CM

## 2023-05-13 LAB — POCT GLYCOSYLATED HEMOGLOBIN (HGB A1C): HbA1c, POC (controlled diabetic range): 11 % — AB (ref 0.0–7.0)

## 2023-05-13 LAB — GLUCOSE, POCT (MANUAL RESULT ENTRY): POC Glucose: 220 mg/dL — AB (ref 70–99)

## 2023-05-13 MED ORDER — CETIRIZINE HCL 10 MG PO TABS: 10.0000 mg | ORAL_TABLET | Freq: Every day | ORAL | 11 refills | Status: AC

## 2023-05-13 MED ORDER — VALSARTAN 80 MG PO TABS: 40.0000 mg | ORAL_TABLET | Freq: Every day | ORAL | 1 refills | Status: DC

## 2023-05-13 MED ORDER — FLUTICASONE PROPIONATE 50 MCG/ACT NA SUSP: 2.0000 | Freq: Every day | NASAL | 3 refills | Status: DC

## 2023-05-13 MED ORDER — ALBUTEROL SULFATE HFA 108 (90 BASE) MCG/ACT IN AERS: 1.0000 | INHALATION_SPRAY | RESPIRATORY_TRACT | 2 refills | Status: AC | PRN

## 2023-05-13 MED ORDER — INSULIN GLARGINE (2 UNIT DIAL) 300 UNIT/ML ~~LOC~~ SOPN: 20.0000 [IU] | PEN_INJECTOR | Freq: Every day | SUBCUTANEOUS | 2 refills | Status: DC

## 2023-05-13 MED ORDER — TIRZEPATIDE 2.5 MG/0.5ML ~~LOC~~ SOAJ: 2.5000 mg | SUBCUTANEOUS | 0 refills | Status: DC

## 2023-05-13 MED ORDER — FUROSEMIDE 20 MG PO TABS: ORAL_TABLET | ORAL | 0 refills | Status: DC

## 2023-05-13 MED ORDER — VENTOLIN HFA 108 (90 BASE) MCG/ACT IN AERS: 1.0000 | INHALATION_SPRAY | RESPIRATORY_TRACT | 2 refills | Status: DC | PRN

## 2023-05-13 NOTE — Patient Instructions (Addendum)
 Check blood sugars fasting and at bedtime and record.  Drink 64 to 80 ounces water daily  I have advised the patient to work at a goal of eliminating sugary drinks, candy, desserts, sweets, refined sugars, processed foods, and white carbohydrates.

## 2023-05-13 NOTE — Progress Notes (Signed)
 Patient ID: Krista White, female   DOB: February 08, 1980, 43 y.o.   MRN: [id number]   Krista White, is a 43 y.o. female  KGM:[id number]  [id number]  DOB - 03-02-1980  Chief Complaint  Patient presents with   Diabetes    DM f/u. Allergies / asthma concern - SOB Vision concerns - blurry vision       Subjective:   Krista White is a 43 y.o. female here today for congestion and some wheezing for about 3 weeks since trees and plants are blooming.  She started flonase about 10 days ago with minimal relief.  +nasal and sinus congestion.  No fever.  Tested at work for flu/covid and was negative.  She has also been wheezing for about 3 weeks.  She has had to use inhalers in the past but not in a while.  She does have minimal swelling of her BLE at the end of work days sometimes.    Takes toujeo about half the time.  Not taking metformin at all.  Takes valsartan about twice a week bc BP has been fine.  Last took valsartan 5 days ago.  Not checking blood sugars regularly.  Not following diabetic diet.  Interested in something that might help with weight loss.    No problems updated.  ALLERGIES: Allergies  Allergen Reactions   Asa [Aspirin] Hives   Latex Itching   Pineapple     Mouth itches   Tilactase Diarrhea    PAST MEDICAL HISTORY: Past Medical History:  Diagnosis Date   Anemia    COVID-19 virus infection 01/15/2021   Diabetes mellitus without complication (HCC)    pt states she was pre diabetic but is no longer. Takes no meds and does not check her blood sugar.    Endometrial cancer [facility])    Endometrial cancer (HCC) 02/21/2015   Homelessness 05/24/2020   IBS (irritable bowel syndrome)    Nocturia    Obesity    Tingling    right hand   Vaginal yeast infection 02/06/2021    MEDICATIONS AT HOME: Prior to Admission medications   Medication Sig Start Date End Date Taking? Authorizing Provider  Blood Glucose Monitoring Suppl (ONETOUCH VERIO) w/Device KIT Use to check blood  sugar three times daily. 04/08/22  Yes Storm Frisk, MD  cetirizine (ZYRTEC) 10 MG tablet Take 1 tablet (10 mg total) by mouth daily. 05/13/23  Yes Georgian Co M, PA-C  furosemide (LASIX) 20 MG tablet 1 daily X 5 days for swelling then prn 05/13/23  Yes Clyde Zarrella M, PA-C  glucose blood (ONETOUCH VERIO) test strip Use to check blood sugar three times daily. 04/08/22  Yes Storm Frisk, MD  Insulin Pen Needle 31G X 5 MM MISC Use with insulin pen 11/06/22  Yes Hoy Register, MD  OneTouch Delica Lancets 33G MISC Use to check blood sugar three times daily. 04/08/22  Yes Storm Frisk, MD  tirzepatide [facility]) 2.5 MG/0.5ML Pen Inject 2.5 mg into the skin once a week. 05/13/23  Yes Shaili Donalson, Marzella Schlein, PA-C  albuterol (VENTOLIN HFA) 108 (90 Base) MCG/ACT inhaler Inhale 1-2 puffs into the lungs every 4 (four) hours as needed for wheezing or shortness of breath. 05/13/23   Anders Simmonds, PA-C  fluticasone (FLONASE) 50 MCG/ACT nasal spray Place 2 sprays into both nostrils daily. 05/13/23   Anders Simmonds, PA-C  insulin glargine, 2 Unit Dial, (TOUJEO MAX) 300 UNIT/ML Solostar Pen Inject 20 Units into the skin daily. 05/13/23   Nayanna Seaborn,  Marzella Schlein, PA-C  valsartan (DIOVAN) 80 MG tablet Take 0.5 tablets (40 mg total) by mouth daily. 05/13/23   Aibhlinn Kalmar, Marzella Schlein, PA-C    ROS: Neg HEENT Neg resp Neg cardiac Neg GI Neg GU Neg MS Neg psych Neg neuro  Objective:   Vitals:   05/13/23 0912  BP: 123/75  Pulse: 82  Temp: 98 F (36.7 C)  TempSrc: Oral  SpO2: 96%  Weight: (!) 403 lb (182.8 kg)  Height: 5\' 4"  (1.626 m)   Exam General appearance : Awake, alert, not in any distress. Speech Clear. Not toxic looking;  obese HEENT: Atraumatic and Normocephalic, pupils equally reactive to light and accomodation, EOM intact.  B turbinates and eustachian tubes congested.   Neck: Supple, no JVD. No cervical lymphadenopathy.  Chest: fair air entry bilaterally.  No rales/rhonchi.  Minimal wheezing CVS:  S1 S2 regular, no murmurs.  Extremities: B/L Lower Ext shows minimal, non-pitting edema, both legs are warm to touch Neurology: Awake alert, and oriented X 3, CN II-XII intact, Non focal Skin: No Rash  Data Review Lab Results  Component Value Date   HGBA1C 11.0 (A) 05/13/2023   HGBA1C >15 03/11/2022   HGBA1C >15 02/06/2021    Assessment & Plan   1. Uncontrolled type 2 diabetes mellitus with hyperglycemia, with long-term current use of insulin (HCC) (Primary) Uncontrolled, not compliant.  No taking metformin, only taking toujeo 50% of the time.  Increase compliance. Does not want to be on metformin.  Will titrate mounjaro up then toujeo down as appropriate.  Labs in 1 month - Glucose (CBG) - HgB A1c - tirzepatide (MOUNJARO) 2.5 MG/0.5ML Pen; Inject 2.5 mg into the skin once a week.  Dispense: 2 mL; Refill: 0 - insulin glargine, 2 Unit Dial, (TOUJEO MAX) 300 UNIT/ML Solostar Pen; Inject 20 Units into the skin daily.  Dispense: 15 mL; Refill: 2 - Ambulatory referral to Ophthalmology - Basic Metabolic Panel; Future Check blood sugars fasting and at bedtime and record.  Drink 64 to 80 ounces water daily  I have advised the patient to work at a goal of eliminating sugary drinks, candy, desserts, sweets, refined sugars, processed foods, and white carbohydrates.   2. ETD (Eustachian tube dysfunction), left - fluticasone (FLONASE) 50 MCG/ACT nasal spray; Place 2 sprays into both nostrils daily.  Dispense: 16 g; Refill: 3 - cetirizine (ZYRTEC) 10 MG tablet; Take 1 tablet (10 mg total) by mouth daily.  Dispense: 30 tablet; Refill: 11  3. Vision changes-not acute Due to uncontrolled diabetes - Ambulatory referral to Ophthalmology  4. Bronchospasm - albuterol (VENTOLIN HFA) 108 (90 Base) MCG/ACT inhaler; Inhale 1-2 puffs into the lungs every 4 (four) hours as needed for wheezing or shortness of breath.  Dispense: 18 each; Refill: 2  5. Seasonal allergies Zyrtec and flonase  6. Edema,  unspecified type No signs of failure - furosemide (LASIX) 20 MG tablet; 1 daily X 5 days for swelling then prn  Dispense: 30 tablet; Refill: 0  7. Noncompliance Compliance imperative    Return in about 1 month (around 06/12/2023) for [facility] for diabetes and titration of meds and labs.  The patient was given clear instructions to go to ER or return to medical center if symptoms don't improve, worsen or new problems develop. The patient verbalized understanding. The patient was told to call to get lab results if they haven't heard anything in the next week.      Georgian Co, PA-C [facility] [place],  [place] [phone]   05/13/2023, 10:10 AM

## 2023-05-21 ENCOUNTER — Ambulatory Visit: Admitting: Critical Care Medicine

## 2023-05-25 ENCOUNTER — Other Ambulatory Visit: Payer: Self-pay

## 2023-05-25 ENCOUNTER — Encounter: Payer: Self-pay | Admitting: Physician Assistant

## 2023-05-25 ENCOUNTER — Telehealth: Payer: Self-pay

## 2023-05-25 NOTE — Telephone Encounter (Signed)
 Copied from CRM [id number]. Topic: Clinical - Prescription Issue >> May 25, 2023 10:54 AM Krista White wrote: Reason for CRM: Patient still has not heard about anything on her prior authorization for her prescription. Please follow up

## 2023-05-25 NOTE — Telephone Encounter (Signed)
 Please see pt message in regards to referral

## 2023-05-27 ENCOUNTER — Other Ambulatory Visit

## 2023-05-28 ENCOUNTER — Ambulatory Visit: Attending: Family Medicine

## 2023-05-28 DIAGNOSIS — E1165 Type 2 diabetes mellitus with hyperglycemia: Secondary | ICD-10-CM

## 2023-05-28 DIAGNOSIS — Z794 Long term (current) use of insulin: Secondary | ICD-10-CM | POA: Diagnosis not present

## 2023-05-29 LAB — BASIC METABOLIC PANEL WITH GFR
BUN/Creatinine Ratio: 11 (ref 9–23)
BUN: 8 mg/dL (ref 6–24)
CO2: 25 mmol/L (ref 20–29)
Calcium: 9.8 mg/dL (ref 8.7–10.2)
Chloride: 97 mmol/L (ref 96–106)
Creatinine, Ser: 0.72 mg/dL (ref 0.57–1.00)
Glucose: 220 mg/dL — ABNORMAL HIGH (ref 70–99)
Potassium: 4.6 mmol/L (ref 3.5–5.2)
Sodium: 135 mmol/L (ref 134–144)
eGFR: 106 mL/min/{1.73_m2} (ref >59)

## 2023-06-04 ENCOUNTER — Other Ambulatory Visit: Payer: Self-pay | Admitting: Physician Assistant

## 2023-06-04 DIAGNOSIS — R609 Edema, unspecified: Secondary | ICD-10-CM

## 2023-06-10 ENCOUNTER — Encounter: Payer: Self-pay | Admitting: Physician Assistant

## 2023-06-10 ENCOUNTER — Telehealth: Admitting: Nurse Practitioner

## 2023-06-10 DIAGNOSIS — S025XXA Fracture of tooth (traumatic), initial encounter for closed fracture: Secondary | ICD-10-CM

## 2023-06-10 MED ORDER — PENICILLIN V POTASSIUM 500 MG PO TABS
500.0000 mg | ORAL_TABLET | Freq: Three times a day (TID) | ORAL | 0 refills | Status: AC
Start: 2023-06-10 — End: 2023-06-17

## 2023-06-10 NOTE — Progress Notes (Signed)
 E-Visit for Dental Pain  We are sorry that you are not feeling well.  Here is how we plan to help!  Based on what you have shared with me in the questionnaire, it sounds like you have a broken tooth   Pen VK 500mg  3 times a day for 7 days  It is imperative that you see a dentist within 10 days of this eVisit to determine the cause of the dental pain and be sure it is adequately treated  A toothache or tooth pain is caused when the nerve in the root of a tooth or surrounding a tooth is irritated. Dental (tooth) infection, decay, injury, or loss of a tooth are the most common causes of dental pain. Pain may also occur after an extraction (tooth is pulled out). Pain sometimes originates from other areas and radiates to the jaw, thus appearing to be tooth pain.Bacteria growing inside your mouth can contribute to gum disease and dental decay, both of which can cause pain. A toothache occurs from inflammation of the central portion of the tooth called pulp. The pulp contains nerve endings that are very sensitive to pain. Inflammation to the pulp or pulpitis may be caused by dental cavities, trauma, and infection.    HOME CARE:   For toothaches: Over-the-counter pain medications such as acetaminophen  or ibuprofen  may be used. Take these as directed on the package while you arrange for a dental appointment. Avoid very cold or hot foods, because they may make the pain worse. You may get relief from biting on a cotton ball soaked in oil of cloves. You can get oil of cloves at most drug stores.  For jaw pain:  Aspirin may be helpful for problems in the joint of the jaw in adults. If pain happens every time you open your mouth widely, the temporomandibular joint (TMJ) may be the source of the pain. Yawning or taking a large bite of food may worsen the pain. An appointment with your doctor or dentist will help you find the cause.     GET HELP RIGHT AWAY IF:  You have a high fever or chills If you  have had a recent head or face injury and develop headache, light headedness, nausea, vomiting, or other symptoms that concern you after an injury to your face or mouth, you could have a more serious injury in addition to your dental injury. A facial rash associated with a toothache: This condition may improve with medication. Contact your doctor for them to decide what is appropriate. Any jaw pain occurring with chest pain: Although jaw pain is most commonly caused by dental disease, it is sometimes referred pain from other areas. People with heart disease, especially people who have had stents placed, people with diabetes, or those who have had heart surgery may have jaw pain as a symptom of heart attack or angina. If your jaw or tooth pain is associated with lightheadedness, sweating, or shortness of breath, you should see a doctor as soon as possible. Trouble swallowing or excessive pain or bleeding from gums: If you have a history of a weakened immune system, diabetes, or steroid use, you may be more susceptible to infections. Infections can often be more severe and extensive or caused by unusual organisms. Dental and gum infections in people with these conditions may require more aggressive treatment. An abscess may need draining or IV antibiotics, for example.  MAKE SURE YOU   Understand these instructions. Will watch your condition. Will get help right away  if you are not doing well or get worse.  Thank you for choosing an e-visit.  Your e-visit answers were reviewed by a board certified advanced clinical practitioner to complete your personal care plan. Depending upon the condition, your plan could have included both over the counter or prescription medications.  Please review your pharmacy choice. Make sure the pharmacy is open so you can pick up prescription now. If there is a problem, you may contact your provider through [employer] and have the prescription routed to another  pharmacy.  Your safety is important to us . If you have drug allergies check your prescription carefully.   For the next 24 hours you can use MyChart to ask questions about today's visit, request a non-urgent call back, or ask for a work or school excuse. You will get an email in the next two days asking about your experience. I hope that your e-visit has been valuable and will speed your recovery. I spent approximately 5 minutes reviewing the patient's history, current symptoms and coordinating their care today.

## 2023-06-11 NOTE — Progress Notes (Deleted)
    S:     No chief complaint on file.  Last visit with pharmacist on 04/08/2022 identified adherence issues with her metformin  and insulin  and patient was only taking Trulicity . Reported not taking blood sugar readings at home because did not have a meter. Patient was restarted on metformin  750 mg XR daily, continued Trulicity  1.5 mg weekly, and Toujeo  10 U daily. Rx was sent for OneTouch. On 06/19/2022 pharmacist messaged patient about increasing insulin  to 20 U daily due to loss of coverage for Trulicity . Was recently seen on 05/13/2023 by PA [facility] for diabetes management and at this visit patient reported taking Toujeo  about half the time and not taking metformin  at all. Continued to not measure blood sugar at home and reported not following diabetic diet. A1c at that visit was 11%. Patient was started on Mounjaro 2.5 mg weekly, continued insulin  glargine 20 U daily.   Today, presents for diabetes evaluation, education, and management.   ***adherence to Mounjaro  ***Ses to Mounjaro ***when was last dose, what day do they administer ***how many pens left ***NV, abdominal pain, diarrhea   ***Recording BS at home:  ***Typical ranges:  ***Higher than 200:  ***hyperglycemic symptoms:  ***nocturia (nighttime urination).  *** neuropathy (nerve pain). ***visual changes. Patient reports self foot exams.    *** hypoglycemia (sweating, shakiness, dizziness)  Family/Social History:  FHx: DM Tobacco: never smoker  Alcohol: none  Insurance coverage/medication affordability: BCBS  Current diabetes medications include: insulin  glargine 20 U daily and Mounjaro 2.5 mg weekly.   Patient reported dietary habits:***  Patient-reported exercise habits: ***  O:   Lab Results  Component Value Date   HGBA1C 11.0 (A) 05/13/2023   There were no vitals filed for this visit.  Lipid Panel     Component Value Date/Time   CHOL 181 06/14/2020 1449   TRIG 141 06/14/2020 1449   HDL 39 (L)  06/14/2020 1449   CHOLHDL 4.6 (H) 06/14/2020 1449   LDLCALC 117 (H) 06/14/2020 1449    Clinical Atherosclerotic Cardiovascular Disease (ASCVD): No  The 10-year ASCVD risk score (Arnett DK, et al., 2019) is: 3.4%   Values used to calculate the score:     Age: 43 years     Sex: Female     Is Non-Hispanic African American: Yes     Diabetic: Yes     Tobacco smoker: No     Systolic Blood Pressure: 123 mmHg     Is BP treated: No     HDL Cholesterol: 39 mg/dL     Total Cholesterol: 181 mg/dL    A/P: Diabetes longstanding currently uncontrolled. Her A1c has been very high for quite some time. Patient has history of going on prolonged periods without visiting providers. Patient is ***asymptomatic/symptomatic. Patient is *** adherent to medication regimen. Plan to optimize Mounjaro for better glycemic control and adjustment to insulin  regimen. Patient is able to verbalize appropriate hypoglycemia management plan. - increase Mounjaro to 5 mg weekly - continue insulin  glargine 20 U daily -Encouraged pt to check home CBGs.  -Extensively discussed pathophysiology of diabetes, recommended lifestyle interventions, dietary effects on blood sugar control -Counseled on s/sx of and management of hypoglycemia -Next A1C anticipated 08/2023.   Written patient instructions provided.  Total time in face to face counseling 30 minutes.   Follow up with pharmacist in 1 month ***  Georges Kings  PharmD Candidate Class of 2026 [employer]  ***

## 2023-06-12 ENCOUNTER — Ambulatory Visit: Admitting: Pharmacist

## 2023-06-18 ENCOUNTER — Other Ambulatory Visit: Payer: Self-pay

## 2023-07-24 ENCOUNTER — Other Ambulatory Visit: Payer: Self-pay

## 2023-12-01 ENCOUNTER — Telehealth: Admitting: Physician Assistant

## 2023-12-01 DIAGNOSIS — K047 Periapical abscess without sinus: Secondary | ICD-10-CM | POA: Diagnosis not present

## 2023-12-02 MED ORDER — AMOXICILLIN-POT CLAVULANATE 875-125 MG PO TABS: 1.0000 | ORAL_TABLET | Freq: Two times a day (BID) | ORAL | 0 refills | Status: DC

## 2023-12-02 NOTE — Progress Notes (Signed)

## 2024-01-20 ENCOUNTER — Encounter: Payer: Self-pay | Admitting: Nurse Practitioner

## 2024-01-20 ENCOUNTER — Ambulatory Visit: Admitting: Nurse Practitioner

## 2024-01-20 ENCOUNTER — Other Ambulatory Visit: Payer: Self-pay | Admitting: Family Medicine

## 2024-01-20 ENCOUNTER — Other Ambulatory Visit: Payer: Self-pay | Admitting: Nurse Practitioner

## 2024-01-20 VITALS — BP 127/84 | HR 90 | Ht 64.0 in | Wt 371.6 lb

## 2024-01-20 DIAGNOSIS — E119 Type 2 diabetes mellitus without complications: Secondary | ICD-10-CM

## 2024-01-20 DIAGNOSIS — I10 Essential (primary) hypertension: Secondary | ICD-10-CM

## 2024-01-20 DIAGNOSIS — Z794 Long term (current) use of insulin: Secondary | ICD-10-CM | POA: Diagnosis not present

## 2024-01-20 DIAGNOSIS — Z1231 Encounter for screening mammogram for malignant neoplasm of breast: Secondary | ICD-10-CM

## 2024-01-20 DIAGNOSIS — Z6841 Body Mass Index (BMI) 40.0 and over, adult: Secondary | ICD-10-CM

## 2024-01-20 DIAGNOSIS — E785 Hyperlipidemia, unspecified: Secondary | ICD-10-CM

## 2024-01-20 DIAGNOSIS — Z23 Encounter for immunization: Secondary | ICD-10-CM | POA: Diagnosis not present

## 2024-01-20 DIAGNOSIS — Z1211 Encounter for screening for malignant neoplasm of colon: Secondary | ICD-10-CM

## 2024-01-20 DIAGNOSIS — R7989 Other specified abnormal findings of blood chemistry: Secondary | ICD-10-CM

## 2024-01-20 LAB — POCT GLYCOSYLATED HEMOGLOBIN (HGB A1C): HbA1c POC (<> result, manual entry): >15 % (ref 4.0–5.6)

## 2024-01-20 MED ORDER — VALSARTAN 80 MG PO TABS: 80.0000 mg | ORAL_TABLET | Freq: Every day | ORAL | 1 refills | Status: AC

## 2024-01-20 MED ORDER — TRULICITY 0.75 MG/0.5ML ~~LOC~~ SOAJ: 0.7500 mg | SUBCUTANEOUS | 1 refills | Status: DC

## 2024-01-20 MED ORDER — INSULIN PEN NEEDLE 31G X 5 MM MISC: 0 refills | Status: AC

## 2024-01-20 MED ORDER — FREESTYLE LIBRE 3 PLUS SENSOR MISC: 3 refills | Status: DC

## 2024-01-20 MED ORDER — FREESTYLE LIBRE 3 READER DEVI: 0 refills | Status: AC

## 2024-01-20 MED ORDER — INSULIN GLARGINE (2 UNIT DIAL) 300 UNIT/ML ~~LOC~~ SOPN: 30.0000 [IU] | PEN_INJECTOR | Freq: Every day | SUBCUTANEOUS | 2 refills | Status: DC

## 2024-01-20 MED ORDER — DEXCOM G7 SENSOR MISC: 6 refills | Status: AC

## 2024-01-20 MED ORDER — DEXCOM G7 RECEIVER DEVI: 0 refills | Status: DC

## 2024-01-20 NOTE — Progress Notes (Signed)
 Wants a once a week inulin instead of daily.

## 2024-01-20 NOTE — Telephone Encounter (Signed)
 PA is needed

## 2024-01-20 NOTE — Progress Notes (Signed)
 Assessment & Plan:  Krista White was seen today for establish care and diabetes.  Diagnoses and all orders for this visit:  Diabetes mellitus with insulin  therapy (HCC) -     POCT glycosylated hemoglobin (Hb A1C) -     Urine Albumin/Creatinine with ratio (send out) [LAB689] -     Ambulatory referral to Ophthalmology -     CMP14+EGFR -     Thyroid  Panel With TSH -     Dulaglutide  (TRULICITY ) 0.75 MG/0.5ML SOAJ; Inject 0.75 mg into the skin once a week. -     insulin  glargine, 2 Unit Dial , (TOUJEO  MAX) 300 UNIT/ML Solostar Pen; Inject 30 Units into the skin daily. -     Continuous Glucose Sensor (FREESTYLE LIBRE 3 PLUS SENSOR) MISC; Change sensor every 15 days. Use to check glucose continuously E11.65 Z79.4 -     Insulin  Pen Needle 31G X 5 MM MISC; Use with insulin  pen -     Continuous Glucose Receiver (FREESTYLE LIBRE 3 READER) DEVI; Place 1 sensor on the skin every 14 days. Use to check glucose continuously E11.65 Z79.4 A1c >15 indicates severe hyperglycemia. Non-adherence to insulin  due to forgetfulness. Trulicity  effective previously but stopped due to insurance. Metformin  caused GI side effects. Glipizide  discontinued January 2023. Discussed GLP-1 receptor agonists. Emphasized insulin  necessity. - Restarted Trulicity  0.75 mg, increase to 1.5 mg after 4 weeks, then 3 mg after another 4 weeks. - Restarted Toujeo  30 units daily. - Sent prescription for Tatamy sensor to pharmacy for insurance check. - Instructed to monitor blood sugars and report via MyChart. - Instructed to set reminders for Trulicity  dose escalation Vision changes No current eye doctor. Referral needed for ophthalmology evaluation. - Sent referral to ophthalmology.  Primary hypertension -     valsartan  (DIOVAN ) 80 MG tablet; Take 1 tablet (80 mg total) by mouth daily. Blood pressure management ongoing with valsartan  80 mg daily. No recent home monitoring due to lack of equipment. - Rechecked blood pressure before leaving  clinic. - Continue valsartan  80 mg daily.   Need for Tdap vaccination -     Tdap vaccine greater than or equal to 7yo IM  Dyslipidemia, goal LDL below 70 -     Lipid panel  Abnormal CBC -     CBC with Differential  Morbid obesity with BMI of 60.0-69.9, adult (HCC) -     Thyroid  Panel With TSH Weight fluctuated, recent weight 371 lbs. Discussed potential weight loss benefits of GLP-1 receptor agonists, not guaranteed with high A1c.  Breast cancer screening by mammogram -     MS 3D SCR MAMMO BILAT BR (aka MM); Future  Colon cancer screening -     Ambulatory referral to Gastroenterology    Patient has been counseled on age-appropriate routine health concerns for screening and prevention. These are reviewed and up-to-date. Referrals have been placed accordingly. Immunizations are up-to-date or declined.    Subjective:   Chief Complaint  Patient presents with   Establish Care   Diabetes    [name] 43 y.o. female presents to office today to establish care and for follow up to DM and HTN  She is overdue for mammogram and referral was placed today.  PMH: anemia, Endometrial cancer with hysterectomy (2018), IBS, DM, HTN, Morbid Obesity, HPL  DM 2 Medications in the past include Toujeo  (was forgetting to administer) Trulicity  (stopped taking due to insurance), Glipizide  XL (she can not recall why she stopped taking this), and Metformin  XR (caused GI upset so  she stopped) Trulicity . A1c today is greater than 15.  She is not taking any medications for her diabetes.  Lab Results  Component Value Date   HGBA1C >15 01/20/2024    Lab Results  Component Value Date   HGBA1C 11.0 (A) 05/13/2023     HTN Blood pressure not at goal. She is currently taking valsartan  80 mg daily.  BP Readings from Last 3 Encounters:  01/20/24 (!) 146/84  05/13/23 123/75  02/10/23 (!) 144/86        Her weight has fluctuated this year, with recorded weights of 380 lbs in January, 403 lbs  in April, and 371 lbs today. She has not been checking her blood pressure at home and does not have a functional blood pressure cuff. Review of Systems  Constitutional:  Negative for fever, malaise/fatigue and weight loss.  HENT: Negative.  Negative for nosebleeds.   Eyes: Negative.  Negative for blurred vision, double vision and photophobia.  Respiratory: Negative.  Negative for cough and shortness of breath.   Cardiovascular: Negative.  Negative for chest pain, palpitations and leg swelling.  Gastrointestinal: Negative.  Negative for heartburn, nausea and vomiting.  Musculoskeletal: Negative.  Negative for myalgias.  Neurological: Negative.  Negative for dizziness, focal weakness, seizures and headaches.  Psychiatric/Behavioral: Negative.  Negative for suicidal ideas.     Past Medical History:  Diagnosis Date   Anemia    COVID-19 virus infection 01/15/2021   Diabetes mellitus without complication (HCC)    pt states she was pre diabetic but is no longer. Takes no meds and does not check her blood sugar.    Endometrial cancer [facility])    Endometrial cancer (HCC) 02/21/2015   Homelessness 05/24/2020   IBS (irritable bowel syndrome)    Nocturia    Obesity    Tingling    right hand   Vaginal yeast infection 02/06/2021    Past Surgical History:  Procedure Laterality Date   COLONOSCOPY WITH PROPOFOL  N/A 02/14/2016   Procedure: COLONOSCOPY WITH PROPOFOL ;  Surgeon: Toribio SHAUNNA Cedar, MD;  Location: [facility];  Service: Endoscopy;  Laterality: N/A;   HYSTEROSCOPY WITH D & C N/A 01/30/2015   Procedure: DILATATION AND CURETTAGE /HYSTEROSCOPY;  Surgeon: Robbi Render, MD;  Location: [facility];  Service: Gynecology;  Laterality: N/A;   ROBOTIC ASSISTED TOTAL HYSTERECTOMY WITH BILATERAL SALPINGO OOPHERECTOMY N/A 03/22/2015   Procedure: XI ROBOTIC ASSISTED TOTAL ABDOMINAL HYSTERECTOMY FOR UTERUS GREATER THAN 250 GMS WITH BILATERAL SALPINGO OOPHORECTOMY;  Surgeon: Maurilio Ship, MD;  Location: [facility];   Service: Gynecology;  Laterality: N/A;   TYMPANOSTOMY TUBE PLACEMENT     bilat     Family History  Problem Relation Age of Onset   Ovarian cancer Mother    Diabetes Father    Heart attack Father    Throat cancer Paternal Uncle    Diabetes Paternal Aunt    Diabetes Maternal Uncle     Social History Reviewed with no changes to be made today.   Outpatient Medications Prior to Visit  Medication Sig Dispense Refill   albuterol  (VENTOLIN  HFA) 108 (90 Base) MCG/ACT inhaler Inhale 1-2 puffs into the lungs every 4 (four) hours as needed for wheezing or shortness of breath. 18 each 2   cetirizine  (ZYRTEC ) 10 MG tablet Take 1 tablet (10 mg total) by mouth daily. 30 tablet 11   valsartan  (DIOVAN ) 80 MG tablet Take 0.5 tablets (40 mg total) by mouth daily. (Patient taking differently: Take 80 mg by mouth daily.) 45 tablet 1  Blood Glucose Monitoring Suppl (ONETOUCH VERIO) w/Device KIT Use to check blood sugar three times daily. (Patient not taking: Reported on 01/20/2024) 1 kit 0   furosemide  (LASIX ) 20 MG tablet 1 DAILY X 5 DAYS FOR SWELLING THEN AS NEEDED (Patient not taking: Reported on 01/20/2024) 90 tablet 1   glucose blood (ONETOUCH VERIO) test strip Use to check blood sugar three times daily. (Patient not taking: Reported on 01/20/2024) 100 each 3   OneTouch Delica Lancets 33G MISC Use to check blood sugar three times daily. (Patient not taking: Reported on 01/20/2024) 100 each 3   amoxicillin -clavulanate (AUGMENTIN ) 875-125 MG tablet Take 1 tablet by mouth 2 (two) times daily. (Patient not taking: Reported on 01/20/2024) 14 tablet 0   insulin  glargine, 2 Unit Dial , (TOUJEO  MAX) 300 UNIT/ML Solostar Pen Inject 20 Units into the skin daily. (Patient not taking: Reported on 01/20/2024) 15 mL 2   Insulin  Pen Needle 31G X 5 MM MISC Use with insulin  pen (Patient not taking: Reported on 01/20/2024) 100 each 0   No facility-administered medications prior to visit.    Allergies[1]      Objective:    BP (!) 146/84 (BP Location: Left Arm, Patient Position: Sitting, Cuff Size: Normal)   Pulse 90   Ht 5' 4 (1.626 m)   Wt (!) 371 lb 9.6 oz (168.6 kg)   LMP 03/15/2015   SpO2 98%   BMI 63.78 kg/m  Wt Readings from Last 3 Encounters:  01/20/24 (!) 371 lb 9.6 oz (168.6 kg)  05/13/23 (!) 403 lb (182.8 kg)  02/10/23 (!) 380 lb 11.8 oz (172.7 kg)    Physical Exam Vitals and nursing note reviewed.  Constitutional:      Appearance: She is well-developed.  HENT:     Head: Normocephalic and atraumatic.  Cardiovascular:     Rate and Rhythm: Normal rate and regular rhythm.     Heart sounds: Normal heart sounds. No murmur heard.    No friction rub. No gallop.  Pulmonary:     Effort: Pulmonary effort is normal. No tachypnea or respiratory distress.     Breath sounds: Normal breath sounds. No decreased breath sounds, wheezing, rhonchi or rales.  Chest:     Chest wall: No tenderness.  Abdominal:     General: Bowel sounds are normal.     Palpations: Abdomen is soft.  Musculoskeletal:        General: Normal range of motion.     Cervical back: Normal range of motion.  Skin:    General: Skin is warm and dry.  Neurological:     Mental Status: She is alert and oriented to person, place, and time.     Coordination: Coordination normal.  Psychiatric:        Behavior: Behavior normal. Behavior is cooperative.        Thought Content: Thought content normal.        Judgment: Judgment normal.          Patient has been counseled extensively about nutrition and exercise as well as the importance of adherence with medications and regular follow-up. The patient was given clear instructions to go to ER or return to medical center if symptoms don't improve, worsen or new problems develop. The patient verbalized understanding.   Follow-up: Return in about 3 months (around 04/25/2024).   Haze LELON Servant, FNP-BC [facility],  [place] [phone]   01/20/2024, 3:11 PM     [1]  Allergies Allergen Reactions  Asa [Aspirin] Hives   Latex Itching   Pineapple     Mouth itches   Tilactase Diarrhea

## 2024-01-21 ENCOUNTER — Telehealth: Payer: Self-pay

## 2024-01-21 ENCOUNTER — Other Ambulatory Visit: Payer: Self-pay

## 2024-01-21 NOTE — Telephone Encounter (Signed)
 Pharmacy Patient Advocate Encounter  Received notification from [facility] that Prior Authorization for [facility] G7 SENSOR has been APPROVED from 01/21/2024 to 01/20/2027   PA #/Case ID/Reference #: [id number]

## 2024-01-22 ENCOUNTER — Other Ambulatory Visit: Payer: Self-pay

## 2024-01-22 ENCOUNTER — Other Ambulatory Visit: Payer: Self-pay | Admitting: Pharmacist

## 2024-01-22 LAB — CBC WITH DIFFERENTIAL/PLATELET
Basophils Absolute: 0 x10E3/uL (ref 0.0–0.2)
Basos: 0 %
EOS (ABSOLUTE): 0.1 x10E3/uL (ref 0.0–0.4)
Eos: 1 %
Hematocrit: 43 % (ref 34.0–46.6)
Hemoglobin: 14 g/dL (ref 11.1–15.9)
Immature Grans (Abs): 0 x10E3/uL (ref 0.0–0.1)
Immature Granulocytes: 0 %
Lymphocytes Absolute: 4 x10E3/uL — ABNORMAL HIGH (ref 0.7–3.1)
Lymphs: 42 %
MCH: 27.3 pg (ref 26.6–33.0)
MCHC: 32.6 g/dL (ref 31.5–35.7)
MCV: 84 fL (ref 79–97)
Monocytes Absolute: 0.5 x10E3/uL (ref 0.1–0.9)
Monocytes: 5 %
Neutrophils Absolute: 5 x10E3/uL (ref 1.4–7.0)
Neutrophils: 52 %
Platelets: 432 x10E3/uL (ref 150–450)
RBC: 5.13 x10E6/uL (ref 3.77–5.28)
RDW: 12.8 % (ref 11.7–15.4)
WBC: 9.6 x10E3/uL (ref 3.4–10.8)

## 2024-01-22 LAB — LIPID PANEL
Chol/HDL Ratio: 6.1 ratio — ABNORMAL HIGH (ref 0.0–4.4)
Cholesterol, Total: 276 mg/dL — ABNORMAL HIGH (ref 100–199)
HDL: 45 mg/dL (ref >39)
LDL Chol Calc (NIH): 198 mg/dL — ABNORMAL HIGH (ref 0–99)
Triglycerides: 175 mg/dL — ABNORMAL HIGH (ref 0–149)
VLDL Cholesterol Cal: 33 mg/dL (ref 5–40)

## 2024-01-22 LAB — THYROID PANEL WITH TSH
Free Thyroxine Index: 2.4 (ref 1.2–4.9)
T3 Uptake Ratio: 29 % (ref 24–39)
T4, Total: 8.3 ug/dL (ref 4.5–12.0)
TSH: 1.36 u[IU]/mL (ref 0.450–4.500)

## 2024-01-22 LAB — CMP14+EGFR
ALT: 14 IU/L (ref 0–32)
AST: 13 IU/L (ref 0–40)
Albumin: 4.4 g/dL (ref 3.9–4.9)
Alkaline Phosphatase: 145 IU/L — ABNORMAL HIGH (ref 41–116)
BUN/Creatinine Ratio: 15 (ref 9–23)
BUN: 11 mg/dL (ref 6–24)
Bilirubin Total: 0.3 mg/dL (ref 0.0–1.2)
CO2: 23 mmol/L (ref 20–29)
Calcium: 9.8 mg/dL (ref 8.7–10.2)
Chloride: 95 mmol/L — ABNORMAL LOW (ref 96–106)
Creatinine, Ser: 0.75 mg/dL (ref 0.57–1.00)
Globulin, Total: 3.3 g/dL (ref 1.5–4.5)
Glucose: 373 mg/dL — ABNORMAL HIGH (ref 70–99)
Potassium: 4.2 mmol/L (ref 3.5–5.2)
Sodium: 135 mmol/L (ref 134–144)
Total Protein: 7.7 g/dL (ref 6.0–8.5)
eGFR: 101 mL/min/1.73 (ref >59)

## 2024-01-22 LAB — MICROALBUMIN / CREATININE URINE RATIO
Creatinine, Urine: 64.9 mg/dL
Microalb/Creat Ratio: 11 mg/g{creat} (ref 0–29)
Microalbumin, Urine: 7.3 ug/mL

## 2024-01-22 MED ORDER — DEXCOM G7 RECEIVER DEVI: 0 refills | Status: AC

## 2024-01-23 ENCOUNTER — Ambulatory Visit: Payer: Self-pay | Admitting: Nurse Practitioner

## 2024-01-23 DIAGNOSIS — E782 Mixed hyperlipidemia: Secondary | ICD-10-CM

## 2024-01-23 MED ORDER — ATORVASTATIN CALCIUM 10 MG PO TABS: 10.0000 mg | ORAL_TABLET | Freq: Every day | ORAL | 3 refills | Status: AC

## 2024-01-25 ENCOUNTER — Other Ambulatory Visit: Payer: Self-pay

## 2024-01-25 ENCOUNTER — Telehealth: Payer: Self-pay | Admitting: Nurse Practitioner

## 2024-01-25 ENCOUNTER — Encounter: Payer: Self-pay | Admitting: Nurse Practitioner

## 2024-01-25 DIAGNOSIS — E119 Type 2 diabetes mellitus without complications: Secondary | ICD-10-CM

## 2024-01-25 MED ORDER — INSULIN GLARGINE (2 UNIT DIAL) 300 UNIT/ML ~~LOC~~ SOPN
30.0000 [IU] | PEN_INJECTOR | Freq: Every day | SUBCUTANEOUS | 2 refills | Status: AC
  Filled 2024-01-25: qty 9, 90d supply, fill #0

## 2024-01-25 MED ORDER — TRULICITY 0.75 MG/0.5ML ~~LOC~~ SOAJ
0.7500 mg | SUBCUTANEOUS | 1 refills | Status: DC
  Filled 2024-01-25: qty 2, 28d supply, fill #0

## 2024-01-25 NOTE — Telephone Encounter (Signed)
 Patient aware of providers response of medications prior needing to be completed.  Medication sent to our community pharmacy.

## 2024-01-25 NOTE — Telephone Encounter (Signed)
 Copied from CRM #[id number]. Topic: General - Call Back - No Documentation >> Jan 25, 2024 12:13 PM Krista White wrote:  Reason for CRM: Pt returning a call for [place]. Didn't see documentation. Pt said she is at work. Her work number is [phone] ext [phone].

## 2024-01-26 ENCOUNTER — Other Ambulatory Visit (HOSPITAL_COMMUNITY): Payer: Self-pay

## 2024-01-26 ENCOUNTER — Telehealth: Payer: Self-pay | Admitting: Nurse Practitioner

## 2024-01-26 NOTE — Telephone Encounter (Signed)
 Copied from CRM #[id number]. Topic: Clinical - Medication Refill >> Jan 26, 2024  4:13 PM Zebedee SAUNDERS wrote: Medication: insulin  glargine, 2 Unit Dial , (TOUJEO  MAX) 300 UNIT/ML Solostar Pen  Has the patient contacted their pharmacy? Yes (Agent: If no, request that the patient contact the pharmacy for the refill. If patient does not wish to contact the pharmacy document the reason why and proceed with request.) (Agent: If yes, when and what did the pharmacy advise?)  This is the patient's preferred pharmacy:  [id number] [place], [place] - [address] [place] [address] Phone: [phone] Fax: [phone]  Is this the correct pharmacy for this prescription? Yes If no, delete pharmacy and type the correct one.   Has the prescription been filled recently? Yes  Is the patient out of the medication? Yes  Has the patient been seen for an appointment in the last year OR does the patient have an upcoming appointment? Yes  Can we respond through MyChart? Yes  Agent: Please be advised that Rx refills may take up to 3 business days. We ask that you follow-up with your pharmacy.

## 2024-01-26 NOTE — Telephone Encounter (Signed)
 PA needed

## 2024-01-27 NOTE — Telephone Encounter (Signed)
 PA needed

## 2024-01-27 NOTE — Telephone Encounter (Signed)
 Copied from CRM #[id number]. Topic: Clinical - Medication Prior Auth >> Jan 26, 2024  4:11 PM Krista White wrote:  Reason for CRM: Pt stated pharmacy need prior authorization for insulin  glargine, 2 Unit Dial , (TOUJEO  MAX) 300 UNIT/ML Solostar Pen.  >> Jan 26, 2024  4:15 PM Krista White wrote:  [id number] [place] [place], [place] - [address]  [address] [place] [address]  Phone: [phone] Fax: [phone]

## 2024-02-06 ENCOUNTER — Other Ambulatory Visit: Payer: Self-pay | Admitting: Nurse Practitioner

## 2024-02-06 DIAGNOSIS — E119 Type 2 diabetes mellitus without complications: Secondary | ICD-10-CM

## 2024-02-06 MED ORDER — TRULICITY 1.5 MG/0.5ML ~~LOC~~ SOAJ: 1.5000 mg | SUBCUTANEOUS | 1 refills | Status: DC

## 2024-02-06 MED ORDER — TRULICITY 0.75 MG/0.5ML ~~LOC~~ SOAJ: 0.7500 mg | SUBCUTANEOUS | 1 refills | Status: DC

## 2024-02-08 ENCOUNTER — Ambulatory Visit: Admitting: Nurse Practitioner

## 2024-02-12 ENCOUNTER — Other Ambulatory Visit: Payer: Self-pay | Admitting: Nurse Practitioner

## 2024-02-12 DIAGNOSIS — J101 Influenza due to other identified influenza virus with other respiratory manifestations: Secondary | ICD-10-CM

## 2024-02-12 MED ORDER — OSELTAMIVIR PHOSPHATE 75 MG PO CAPS: 75.0000 mg | ORAL_CAPSULE | Freq: Two times a day (BID) | ORAL | 0 refills | Status: AC

## 2024-02-17 ENCOUNTER — Ambulatory Visit
Admission: RE | Admit: 2024-02-17 | Discharge: 2024-02-17 | Disposition: A | Discharge status: Home / Self Care | Source: Ambulatory Visit | Attending: Nurse Practitioner | Admitting: Nurse Practitioner

## 2024-02-17 DIAGNOSIS — Z1231 Encounter for screening mammogram for malignant neoplasm of breast: Secondary | ICD-10-CM

## 2024-03-04 ENCOUNTER — Other Ambulatory Visit: Payer: Self-pay | Admitting: Nurse Practitioner

## 2024-03-04 DIAGNOSIS — E119 Type 2 diabetes mellitus without complications: Secondary | ICD-10-CM

## 2024-03-04 MED ORDER — TRULICITY 3 MG/0.5ML ~~LOC~~ SOAJ: 3.0000 mg | SUBCUTANEOUS | 1 refills | Status: AC

## 2024-03-09 ENCOUNTER — Encounter: Payer: Self-pay | Admitting: Gastroenterology

## 2024-04-19 ENCOUNTER — Ambulatory Visit: Payer: Self-pay | Admitting: Nurse Practitioner
